# Patient Record
Sex: Female | Born: 1975 | Race: Black or African American | Hispanic: No | Marital: Single | Smoking: Current every day smoker
Health system: Southern US, Community
[De-identification: ages and names within clinical notes are randomized; demographics above are authoritative.]

## PROBLEM LIST (undated history)

## (undated) DIAGNOSIS — D219 Benign neoplasm of connective and other soft tissue, unspecified: Secondary | ICD-10-CM

## (undated) DIAGNOSIS — F32A Depression, unspecified: Secondary | ICD-10-CM

## (undated) DIAGNOSIS — F329 Major depressive disorder, single episode, unspecified: Secondary | ICD-10-CM

## (undated) DIAGNOSIS — G43909 Migraine, unspecified, not intractable, without status migrainosus: Secondary | ICD-10-CM

## (undated) DIAGNOSIS — K429 Umbilical hernia without obstruction or gangrene: Secondary | ICD-10-CM

## (undated) DIAGNOSIS — F419 Anxiety disorder, unspecified: Secondary | ICD-10-CM

## (undated) HISTORY — DX: Depression, unspecified: F32.A

## (undated) HISTORY — PX: TUBAL LIGATION: SHX77

## (undated) HISTORY — DX: Major depressive disorder, single episode, unspecified: F32.9

## (undated) HISTORY — PX: OTHER SURGICAL HISTORY: SHX169

---

## 1997-09-12 ENCOUNTER — Inpatient Hospital Stay (HOSPITAL_COMMUNITY): Admission: EM | Admit: 1997-09-12 | Discharge: 1997-09-12 | Payer: Self-pay | Admitting: Obstetrics

## 1997-11-02 ENCOUNTER — Inpatient Hospital Stay (HOSPITAL_COMMUNITY): Admission: AD | Admit: 1997-11-02 | Discharge: 1997-11-04 | Payer: Self-pay | Admitting: Obstetrics

## 1997-11-10 ENCOUNTER — Other Ambulatory Visit: Admission: RE | Admit: 1997-11-10 | Discharge: 1997-11-10 | Payer: Self-pay | Admitting: Obstetrics

## 1997-11-26 ENCOUNTER — Inpatient Hospital Stay (HOSPITAL_COMMUNITY): Admission: AD | Admit: 1997-11-26 | Discharge: 1997-11-26 | Payer: Self-pay | Admitting: Obstetrics

## 1997-12-14 ENCOUNTER — Ambulatory Visit (HOSPITAL_COMMUNITY): Admission: RE | Admit: 1997-12-14 | Discharge: 1997-12-14 | Payer: Self-pay | Admitting: Obstetrics

## 1998-03-15 ENCOUNTER — Inpatient Hospital Stay (HOSPITAL_COMMUNITY): Admission: AD | Admit: 1998-03-15 | Discharge: 1998-03-15 | Payer: Self-pay | Admitting: Obstetrics

## 1998-04-02 ENCOUNTER — Inpatient Hospital Stay (HOSPITAL_COMMUNITY): Admission: AD | Admit: 1998-04-02 | Discharge: 1998-04-02 | Payer: Self-pay | Admitting: Obstetrics

## 1998-04-04 ENCOUNTER — Inpatient Hospital Stay (HOSPITAL_COMMUNITY): Admission: AD | Admit: 1998-04-04 | Discharge: 1998-04-06 | Payer: Self-pay | Admitting: Obstetrics

## 1998-10-14 ENCOUNTER — Inpatient Hospital Stay (HOSPITAL_COMMUNITY): Admission: AD | Admit: 1998-10-14 | Discharge: 1998-10-14 | Payer: Self-pay | Admitting: *Deleted

## 1998-10-17 ENCOUNTER — Inpatient Hospital Stay (HOSPITAL_COMMUNITY): Admission: AD | Admit: 1998-10-17 | Discharge: 1998-10-17 | Payer: Self-pay | Admitting: Obstetrics

## 1998-12-05 ENCOUNTER — Emergency Department (HOSPITAL_COMMUNITY): Admission: EM | Admit: 1998-12-05 | Discharge: 1998-12-05 | Payer: Self-pay | Admitting: Emergency Medicine

## 1998-12-24 ENCOUNTER — Emergency Department (HOSPITAL_COMMUNITY): Admission: EM | Admit: 1998-12-24 | Discharge: 1998-12-24 | Payer: Self-pay | Admitting: Emergency Medicine

## 1999-03-18 ENCOUNTER — Emergency Department (HOSPITAL_COMMUNITY): Admission: EM | Admit: 1999-03-18 | Discharge: 1999-03-18 | Payer: Self-pay | Admitting: Emergency Medicine

## 1999-03-18 ENCOUNTER — Encounter: Payer: Self-pay | Admitting: Emergency Medicine

## 1999-08-11 ENCOUNTER — Emergency Department (HOSPITAL_COMMUNITY): Admission: EM | Admit: 1999-08-11 | Discharge: 1999-08-11 | Payer: Self-pay | Admitting: Emergency Medicine

## 1999-08-22 ENCOUNTER — Emergency Department (HOSPITAL_COMMUNITY): Admission: EM | Admit: 1999-08-22 | Discharge: 1999-08-22 | Payer: Self-pay | Admitting: Emergency Medicine

## 1999-09-20 ENCOUNTER — Emergency Department (HOSPITAL_COMMUNITY): Admission: EM | Admit: 1999-09-20 | Discharge: 1999-09-20 | Payer: Self-pay | Admitting: Emergency Medicine

## 1999-10-16 ENCOUNTER — Emergency Department (HOSPITAL_COMMUNITY): Admission: EM | Admit: 1999-10-16 | Discharge: 1999-10-16 | Payer: Self-pay | Admitting: Internal Medicine

## 2000-01-17 ENCOUNTER — Other Ambulatory Visit: Admission: RE | Admit: 2000-01-17 | Discharge: 2000-01-17 | Payer: Self-pay | Admitting: Obstetrics

## 2000-05-02 ENCOUNTER — Emergency Department (HOSPITAL_COMMUNITY): Admission: EM | Admit: 2000-05-02 | Discharge: 2000-05-02 | Payer: Self-pay

## 2000-05-27 ENCOUNTER — Emergency Department (HOSPITAL_COMMUNITY): Admission: EM | Admit: 2000-05-27 | Discharge: 2000-05-27 | Payer: Self-pay | Admitting: Emergency Medicine

## 2000-09-17 ENCOUNTER — Emergency Department (HOSPITAL_COMMUNITY): Admission: EM | Admit: 2000-09-17 | Discharge: 2000-09-17 | Payer: Self-pay

## 2001-12-23 ENCOUNTER — Emergency Department (HOSPITAL_COMMUNITY): Admission: EM | Admit: 2001-12-23 | Discharge: 2001-12-23 | Payer: Self-pay | Admitting: Emergency Medicine

## 2002-03-14 ENCOUNTER — Emergency Department (HOSPITAL_COMMUNITY): Admission: EM | Admit: 2002-03-14 | Discharge: 2002-03-15 | Payer: Self-pay | Admitting: Emergency Medicine

## 2002-05-15 ENCOUNTER — Inpatient Hospital Stay (HOSPITAL_COMMUNITY): Admission: AD | Admit: 2002-05-15 | Discharge: 2002-05-15 | Payer: Self-pay | Admitting: Obstetrics and Gynecology

## 2002-06-05 ENCOUNTER — Inpatient Hospital Stay (HOSPITAL_COMMUNITY): Admission: AD | Admit: 2002-06-05 | Discharge: 2002-06-05 | Payer: Self-pay | Admitting: *Deleted

## 2002-06-07 ENCOUNTER — Inpatient Hospital Stay (HOSPITAL_COMMUNITY): Admission: AD | Admit: 2002-06-07 | Discharge: 2002-06-07 | Payer: Self-pay | Admitting: *Deleted

## 2002-06-08 ENCOUNTER — Inpatient Hospital Stay (HOSPITAL_COMMUNITY): Admission: AD | Admit: 2002-06-08 | Discharge: 2002-06-08 | Payer: Self-pay | Admitting: *Deleted

## 2002-06-10 ENCOUNTER — Inpatient Hospital Stay (HOSPITAL_COMMUNITY): Admission: AD | Admit: 2002-06-10 | Discharge: 2002-06-10 | Payer: Self-pay | Admitting: *Deleted

## 2002-06-10 ENCOUNTER — Encounter: Payer: Self-pay | Admitting: *Deleted

## 2002-06-12 ENCOUNTER — Inpatient Hospital Stay (HOSPITAL_COMMUNITY): Admission: AD | Admit: 2002-06-12 | Discharge: 2002-06-12 | Payer: Self-pay | Admitting: *Deleted

## 2002-06-14 ENCOUNTER — Inpatient Hospital Stay (HOSPITAL_COMMUNITY): Admission: AD | Admit: 2002-06-14 | Discharge: 2002-06-14 | Payer: Self-pay | Admitting: *Deleted

## 2002-06-21 ENCOUNTER — Inpatient Hospital Stay (HOSPITAL_COMMUNITY): Admission: AD | Admit: 2002-06-21 | Discharge: 2002-06-21 | Payer: Self-pay | Admitting: *Deleted

## 2002-08-05 ENCOUNTER — Inpatient Hospital Stay (HOSPITAL_COMMUNITY): Admission: AD | Admit: 2002-08-05 | Discharge: 2002-08-05 | Payer: Self-pay | Admitting: Obstetrics

## 2002-09-07 ENCOUNTER — Inpatient Hospital Stay (HOSPITAL_COMMUNITY): Admission: AD | Admit: 2002-09-07 | Discharge: 2002-09-07 | Payer: Self-pay | Admitting: Obstetrics

## 2002-09-10 ENCOUNTER — Inpatient Hospital Stay (HOSPITAL_COMMUNITY): Admission: AD | Admit: 2002-09-10 | Discharge: 2002-09-10 | Payer: Self-pay | Admitting: Obstetrics

## 2002-09-12 ENCOUNTER — Inpatient Hospital Stay (HOSPITAL_COMMUNITY): Admission: AD | Admit: 2002-09-12 | Discharge: 2002-09-12 | Payer: Self-pay | Admitting: Obstetrics

## 2002-09-13 ENCOUNTER — Encounter: Payer: Self-pay | Admitting: Obstetrics

## 2002-09-13 ENCOUNTER — Ambulatory Visit (HOSPITAL_COMMUNITY): Admission: RE | Admit: 2002-09-13 | Discharge: 2002-09-13 | Payer: Self-pay | Admitting: Obstetrics

## 2002-11-08 ENCOUNTER — Inpatient Hospital Stay (HOSPITAL_COMMUNITY): Admission: AD | Admit: 2002-11-08 | Discharge: 2002-11-08 | Payer: Self-pay | Admitting: Obstetrics

## 2002-11-12 ENCOUNTER — Observation Stay (HOSPITAL_COMMUNITY): Admission: AD | Admit: 2002-11-12 | Discharge: 2002-11-13 | Payer: Self-pay | Admitting: Obstetrics

## 2002-12-16 ENCOUNTER — Inpatient Hospital Stay (HOSPITAL_COMMUNITY): Admission: AD | Admit: 2002-12-16 | Discharge: 2002-12-16 | Payer: Self-pay | Admitting: Obstetrics

## 2003-01-09 ENCOUNTER — Inpatient Hospital Stay (HOSPITAL_COMMUNITY): Admission: AD | Admit: 2003-01-09 | Discharge: 2003-01-09 | Payer: Self-pay | Admitting: Obstetrics

## 2003-01-11 ENCOUNTER — Inpatient Hospital Stay (HOSPITAL_COMMUNITY): Admission: AD | Admit: 2003-01-11 | Discharge: 2003-01-12 | Payer: Self-pay | Admitting: Obstetrics

## 2003-01-17 ENCOUNTER — Inpatient Hospital Stay (HOSPITAL_COMMUNITY): Admission: AD | Admit: 2003-01-17 | Discharge: 2003-01-17 | Payer: Self-pay | Admitting: Obstetrics

## 2003-02-15 ENCOUNTER — Inpatient Hospital Stay (HOSPITAL_COMMUNITY): Admission: AD | Admit: 2003-02-15 | Discharge: 2003-02-15 | Payer: Self-pay | Admitting: Obstetrics

## 2003-02-26 ENCOUNTER — Inpatient Hospital Stay (HOSPITAL_COMMUNITY): Admission: AD | Admit: 2003-02-26 | Discharge: 2003-02-26 | Payer: Self-pay | Admitting: Obstetrics

## 2003-03-09 ENCOUNTER — Inpatient Hospital Stay (HOSPITAL_COMMUNITY): Admission: AD | Admit: 2003-03-09 | Discharge: 2003-03-09 | Payer: Self-pay | Admitting: Obstetrics

## 2003-03-11 ENCOUNTER — Inpatient Hospital Stay (HOSPITAL_COMMUNITY): Admission: AD | Admit: 2003-03-11 | Discharge: 2003-03-12 | Payer: Self-pay | Admitting: Obstetrics

## 2003-03-24 ENCOUNTER — Inpatient Hospital Stay (HOSPITAL_COMMUNITY): Admission: AD | Admit: 2003-03-24 | Discharge: 2003-03-24 | Payer: Self-pay | Admitting: Obstetrics

## 2003-03-25 ENCOUNTER — Inpatient Hospital Stay (HOSPITAL_COMMUNITY): Admission: AD | Admit: 2003-03-25 | Discharge: 2003-03-26 | Payer: Self-pay | Admitting: Obstetrics

## 2003-03-28 ENCOUNTER — Inpatient Hospital Stay (HOSPITAL_COMMUNITY): Admission: AD | Admit: 2003-03-28 | Discharge: 2003-03-28 | Payer: Self-pay | Admitting: Obstetrics

## 2003-03-30 ENCOUNTER — Inpatient Hospital Stay (HOSPITAL_COMMUNITY): Admission: AD | Admit: 2003-03-30 | Discharge: 2003-03-30 | Payer: Self-pay | Admitting: Obstetrics

## 2003-04-01 ENCOUNTER — Inpatient Hospital Stay (HOSPITAL_COMMUNITY): Admission: AD | Admit: 2003-04-01 | Discharge: 2003-04-01 | Payer: Self-pay | Admitting: Obstetrics

## 2003-04-06 ENCOUNTER — Inpatient Hospital Stay (HOSPITAL_COMMUNITY): Admission: AD | Admit: 2003-04-06 | Discharge: 2003-04-06 | Payer: Self-pay | Admitting: Obstetrics

## 2003-04-08 ENCOUNTER — Inpatient Hospital Stay (HOSPITAL_COMMUNITY): Admission: AD | Admit: 2003-04-08 | Discharge: 2003-04-08 | Payer: Self-pay | Admitting: Obstetrics

## 2003-04-09 ENCOUNTER — Inpatient Hospital Stay (HOSPITAL_COMMUNITY): Admission: AD | Admit: 2003-04-09 | Discharge: 2003-04-12 | Payer: Self-pay | Admitting: Obstetrics

## 2003-04-09 ENCOUNTER — Encounter (INDEPENDENT_AMBULATORY_CARE_PROVIDER_SITE_OTHER): Payer: Self-pay | Admitting: *Deleted

## 2003-09-03 ENCOUNTER — Emergency Department (HOSPITAL_COMMUNITY): Admission: EM | Admit: 2003-09-03 | Discharge: 2003-09-03 | Payer: Self-pay

## 2003-10-19 ENCOUNTER — Emergency Department (HOSPITAL_COMMUNITY): Admission: EM | Admit: 2003-10-19 | Discharge: 2003-10-19 | Payer: Self-pay | Admitting: Emergency Medicine

## 2003-12-15 ENCOUNTER — Emergency Department (HOSPITAL_COMMUNITY): Admission: EM | Admit: 2003-12-15 | Discharge: 2003-12-16 | Payer: Self-pay | Admitting: Emergency Medicine

## 2004-03-12 ENCOUNTER — Emergency Department (HOSPITAL_COMMUNITY): Admission: EM | Admit: 2004-03-12 | Discharge: 2004-03-12 | Payer: Self-pay | Admitting: Family Medicine

## 2005-06-26 ENCOUNTER — Emergency Department (HOSPITAL_COMMUNITY): Admission: EM | Admit: 2005-06-26 | Discharge: 2005-06-26 | Payer: Self-pay | Admitting: Emergency Medicine

## 2005-07-29 ENCOUNTER — Emergency Department (HOSPITAL_COMMUNITY): Admission: EM | Admit: 2005-07-29 | Discharge: 2005-07-29 | Payer: Self-pay | Admitting: Emergency Medicine

## 2005-12-10 ENCOUNTER — Emergency Department (HOSPITAL_COMMUNITY): Admission: EM | Admit: 2005-12-10 | Discharge: 2005-12-10 | Payer: Self-pay | Admitting: Emergency Medicine

## 2006-01-28 ENCOUNTER — Emergency Department (HOSPITAL_COMMUNITY): Admission: EM | Admit: 2006-01-28 | Discharge: 2006-01-28 | Payer: Self-pay | Admitting: Emergency Medicine

## 2006-02-16 ENCOUNTER — Emergency Department (HOSPITAL_COMMUNITY): Admission: EM | Admit: 2006-02-16 | Discharge: 2006-02-16 | Payer: Self-pay | Admitting: Emergency Medicine

## 2006-11-22 IMAGING — CT CT ABDOMEN W/ CM
1 of 3 series · 14 of 32 positions shown, 19 images · IV contrast (OMNI 350 25 ML & [ID] OMNI 300)
Comparison: None.

CLINICAL DATA: Abdominal and pelvic pain. Periumbilical pain.  
 ABDOMEN CT WITH CONTRAST:
TECHNIQUE: Multidetector CT imaging of the abdomen was performed following the standard protocol during bolus administration of intravenous contrast.
 Contrast:  Oral contrast and 100 cc intravenous Omnipaque 300.
TECHNIQUE: Multidetector CT imaging of the pelvis was performed following the standard protocol during bolus administration of intravenous contrast.  
 The bowel and bladder are unremarkable.  2 cm follicle/cyst in the right ovary is noted.  Appendix is normal.  No free fluid or enlarged lymph nodes.

[Series 2: abd pelvis · axial · 0.74mm/px · z∈[-412,-57]mm · 14 of 83 slices shown, 19 images]
[im 6/83  soft-tissue]
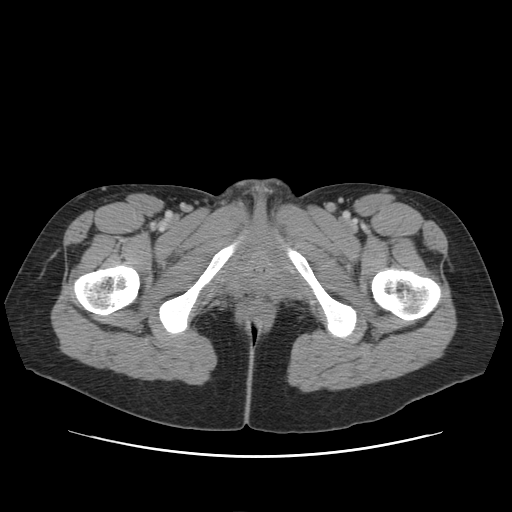
[im 6/83  bone]
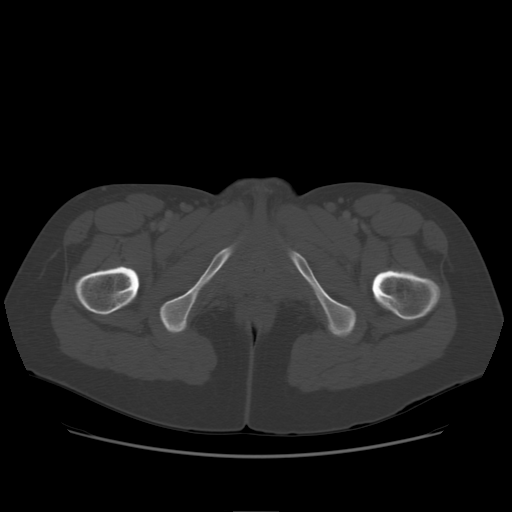
[im 11/83  soft-tissue]
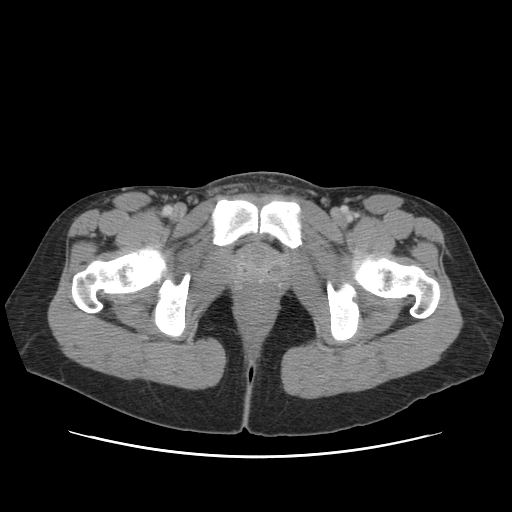
[im 16/83  soft-tissue]
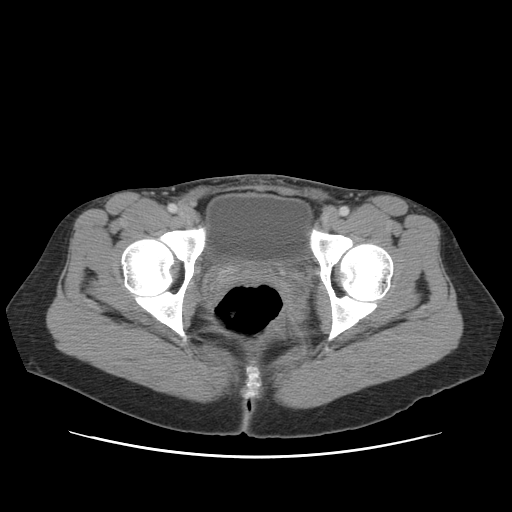
[im 26/83  soft-tissue]
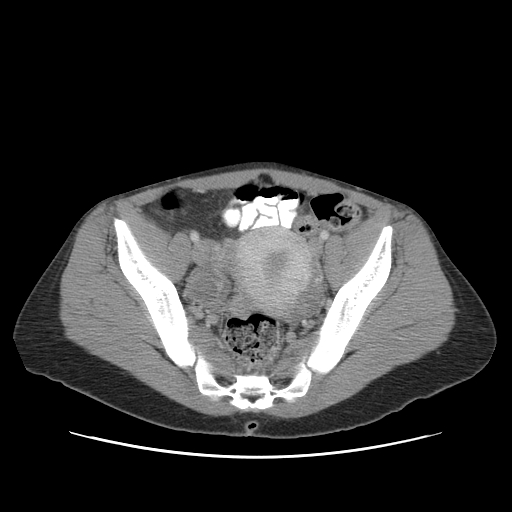
[im 31/83  soft-tissue]
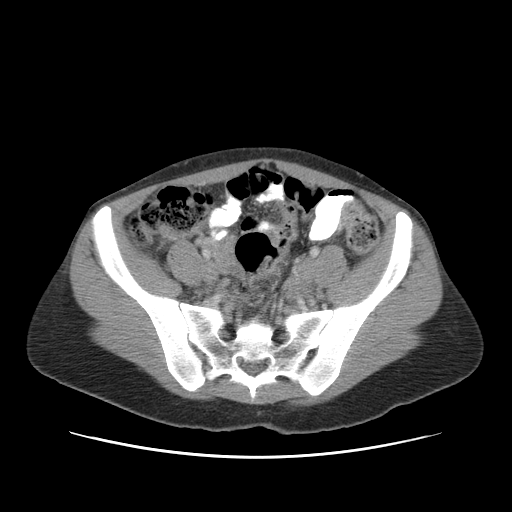
[im 36/83  soft-tissue]
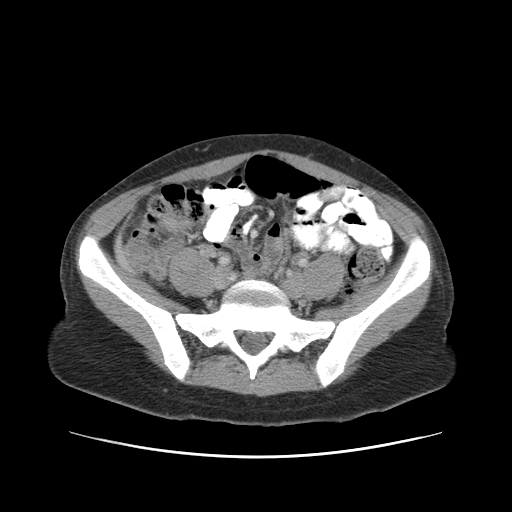
[im 42/83  soft-tissue]
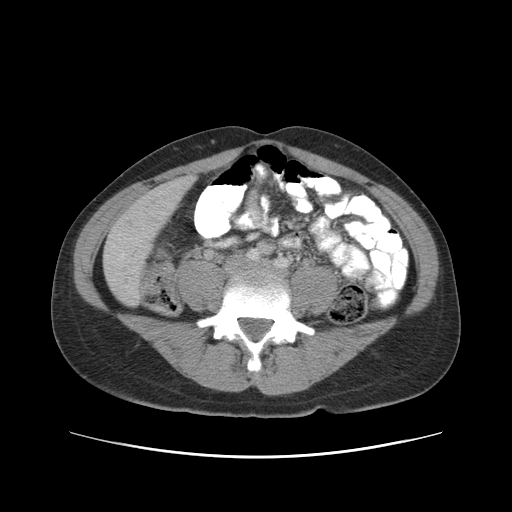
[im 47/83  soft-tissue]
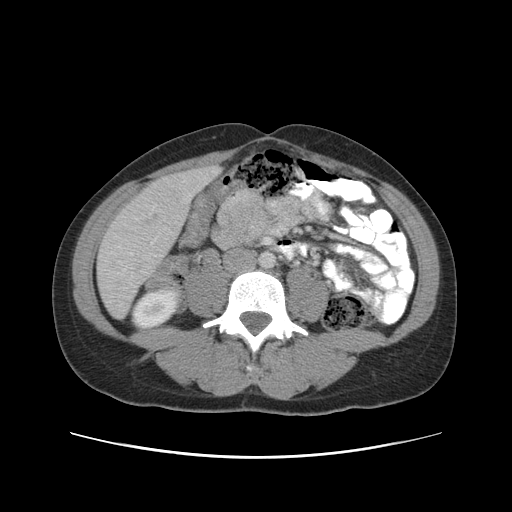
[im 52/83  soft-tissue]
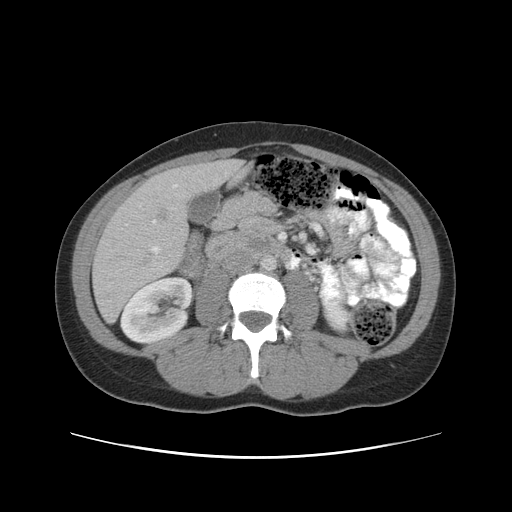
[im 52/83  bone]
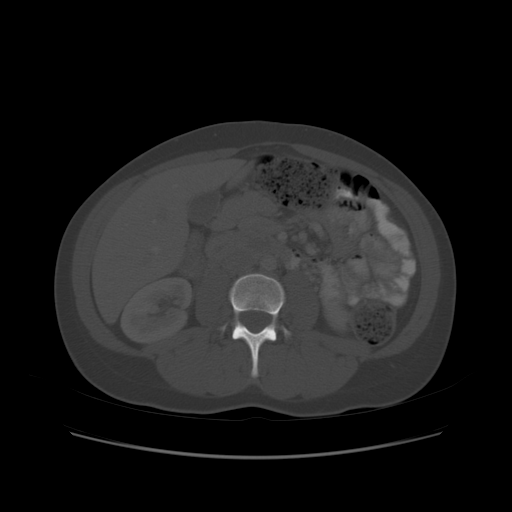
[im 57/83  soft-tissue]
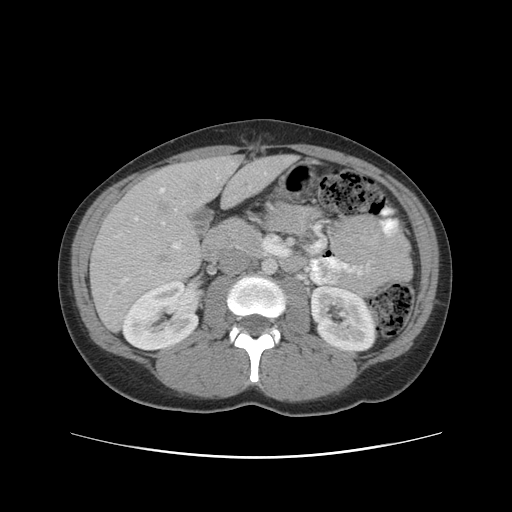
[im 62/83  lung]
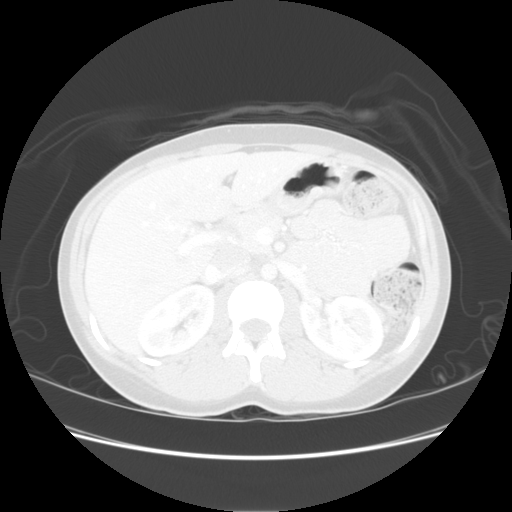
[im 67/83  soft-tissue]
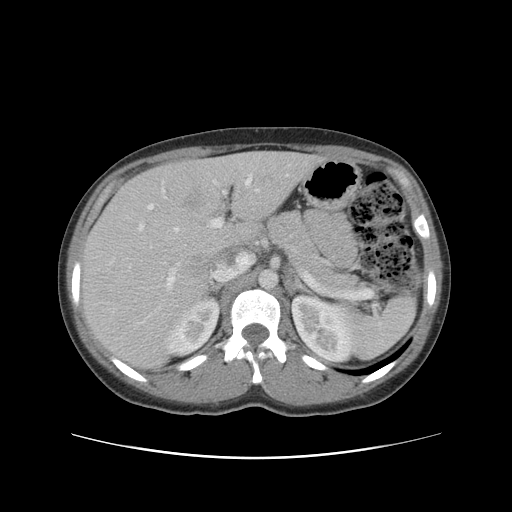
[im 67/83  lung]
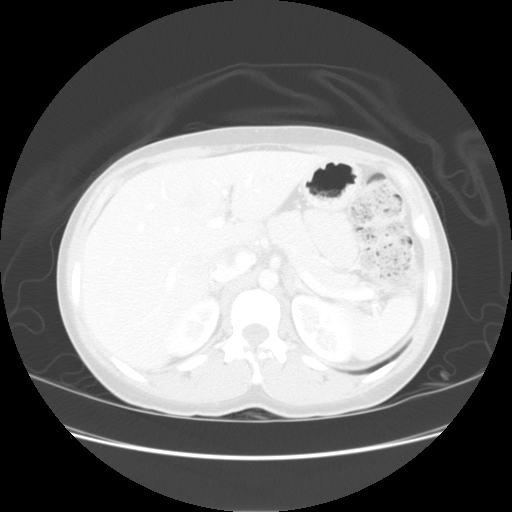
[im 72/83  soft-tissue]
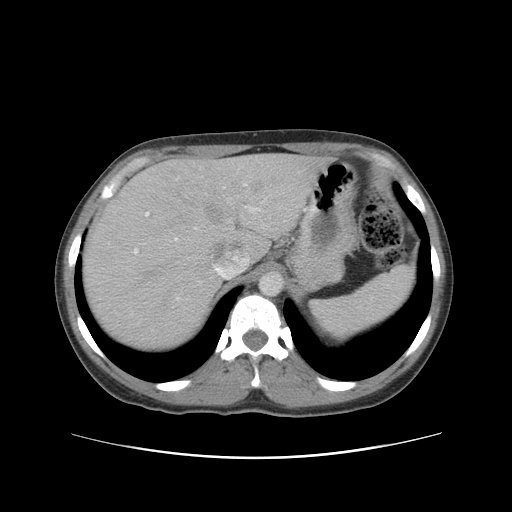
[im 72/83  lung]
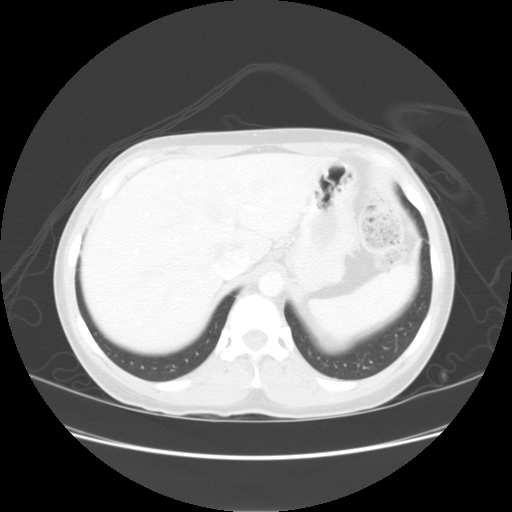
[im 77/83  soft-tissue]
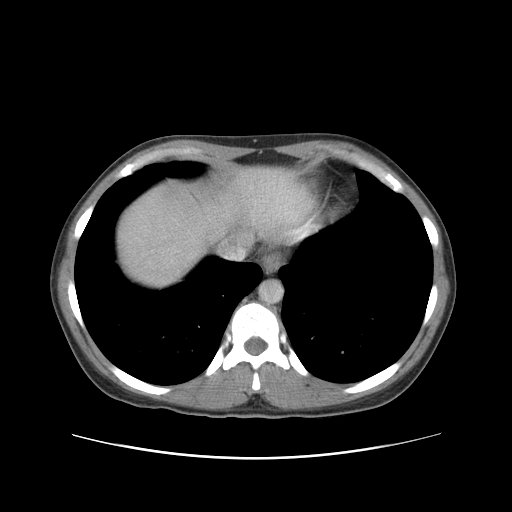
[im 77/83  lung]
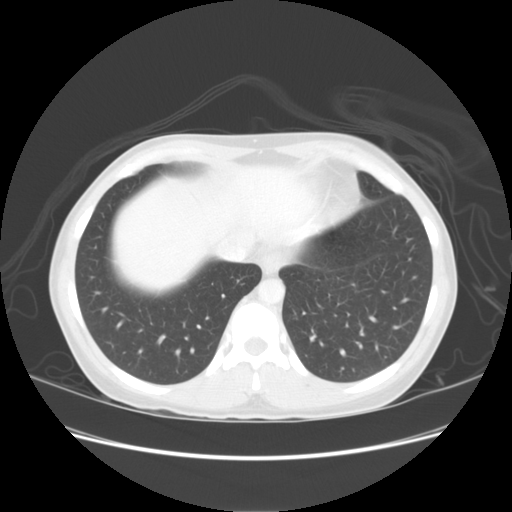

[14 of 32 positions shown; findings below may reference images not displayed]

FINDINGS: The liver, spleen, adrenal glands, kidneys, pancreas, and gallbladder are unremarkable.  No evidence of free fluid, enlarged lymph nodes, abdominal aortic aneurysm, or biliary dilatation.  The visualized bowel is unremarkable.  A small midline ventral hernia in the mid/upper abdomen containing only fat is noted.  No free fluid, enlarged lymph nodes, abdominal aortic aneurysm, or biliary dilatation.
IMPRESSION: Midline mid/upper abdominal ventral hernia containing only fat.  
 PELVIS CT WITH CONTRAST:
IMPRESSION: No acute abnormality.

## 2006-11-22 IMAGING — US US ABDOMEN COMPLETE
1 series · 14 of 25 positions shown · non-contrast
Comparison: None

CLINICAL DATA: Abdominal pain. History of abdominal wall hernia and cesarean
section.

COMPLETE ABDOMINAL ULTRASOUND  06/26/2005:

[Series 1: unknown · 0.27mm/px · 14 of 54 slices shown]
[im 1/54]
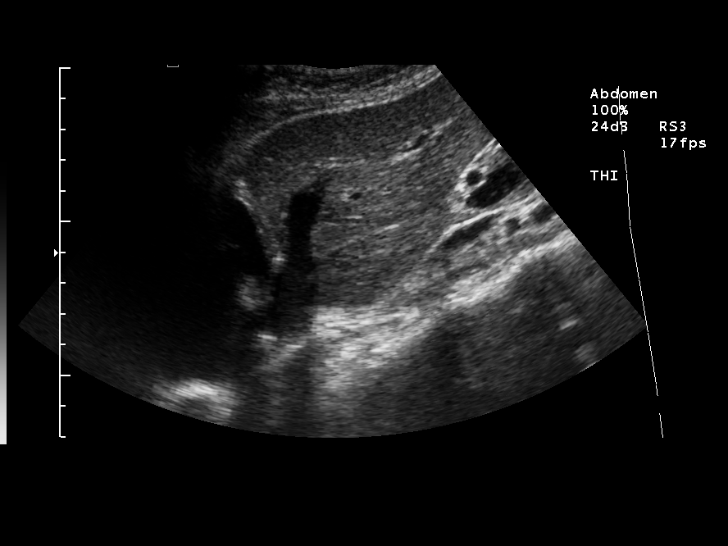
[im 5/54]
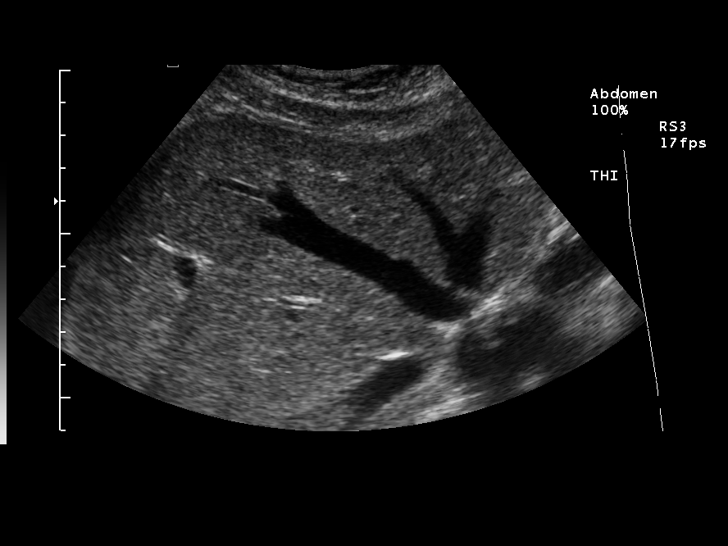
[im 9/54]
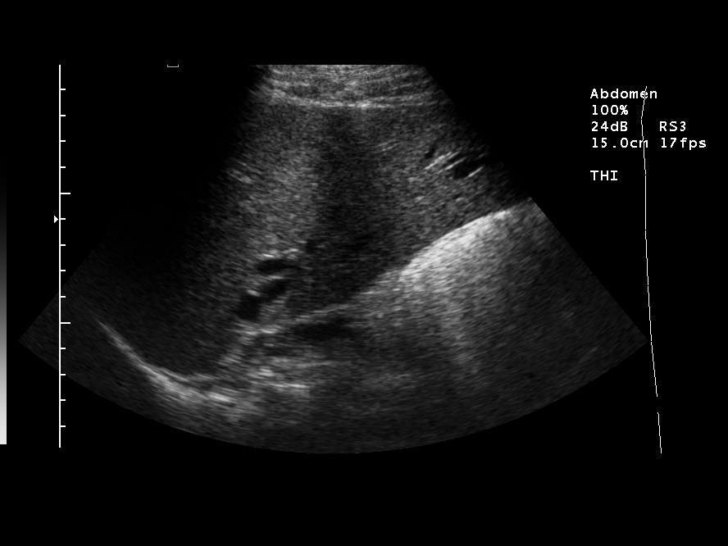
[im 14/54]
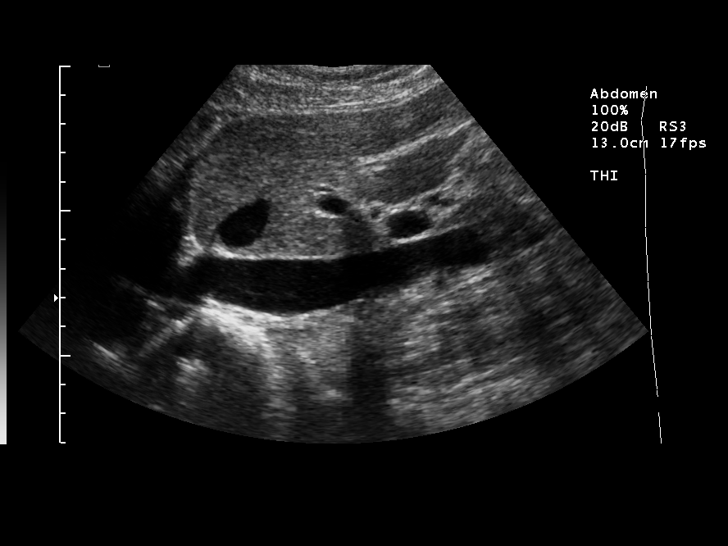
[im 18/54]
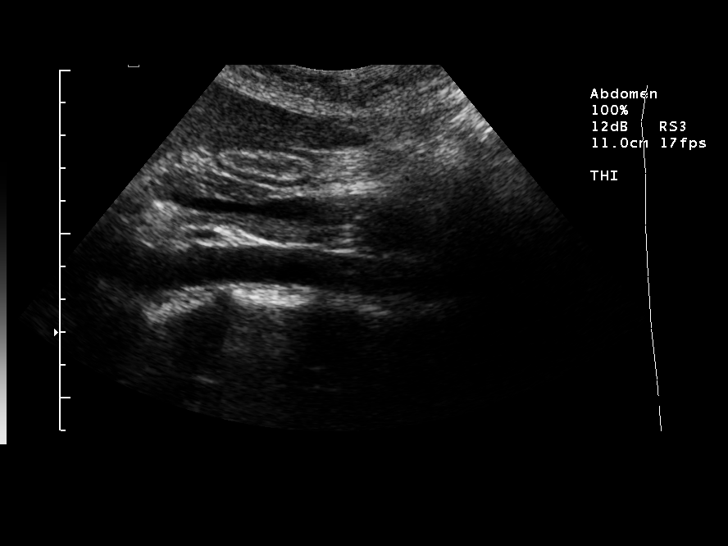
[im 20/54]
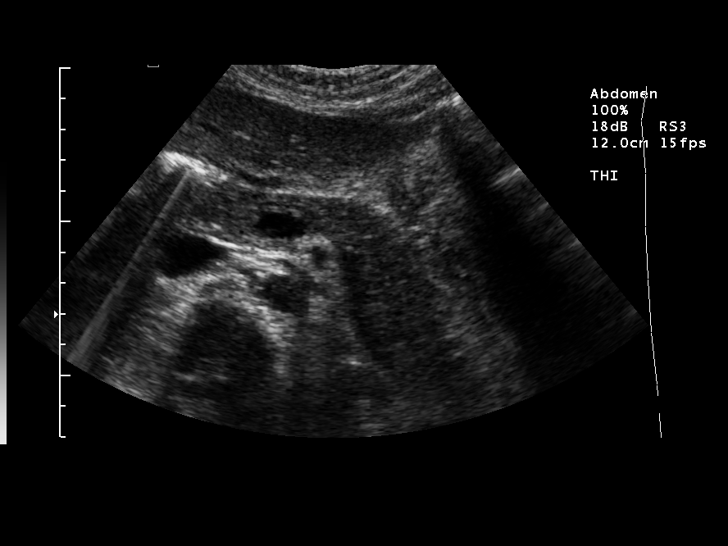
[im 25/54]
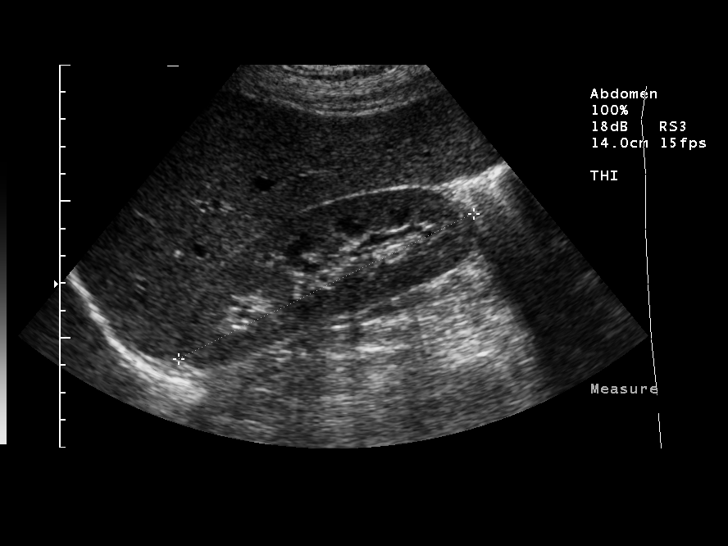
[im 29/54]
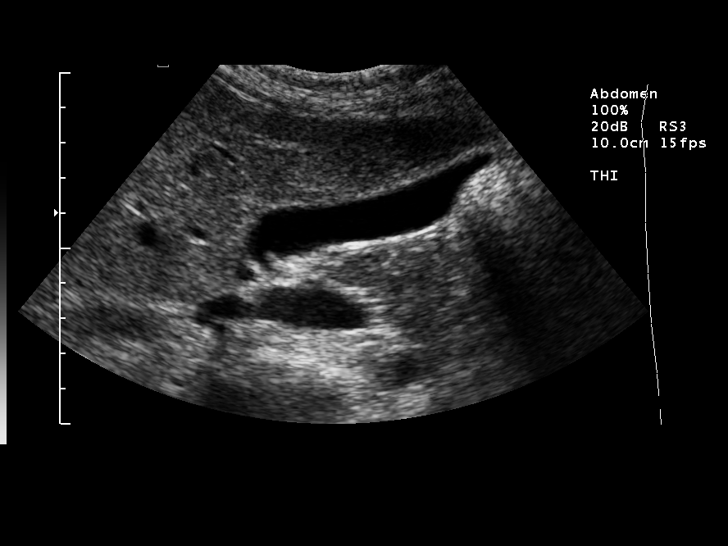
[im 34/54]
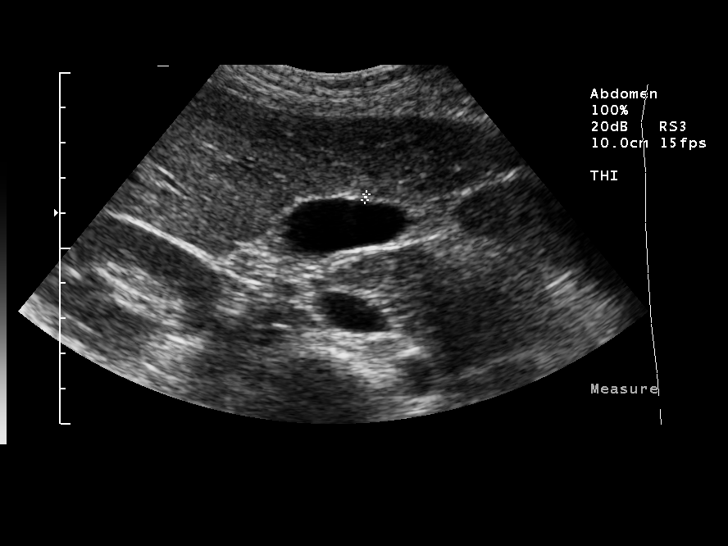
[im 36/54]
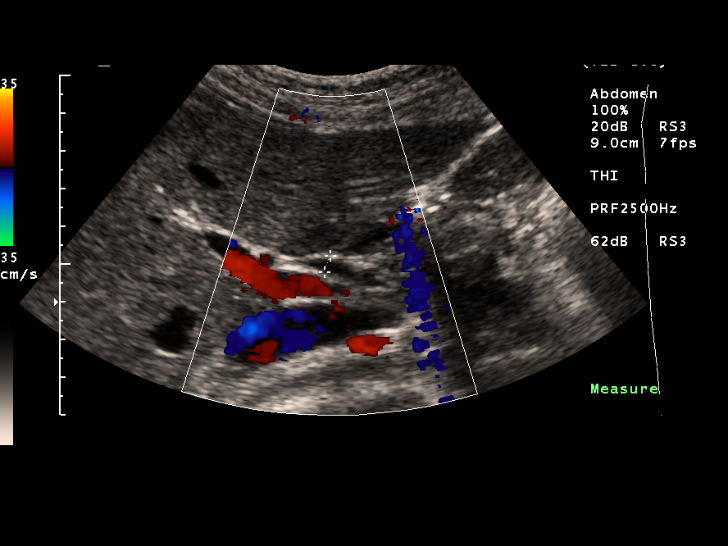
[im 40/54]
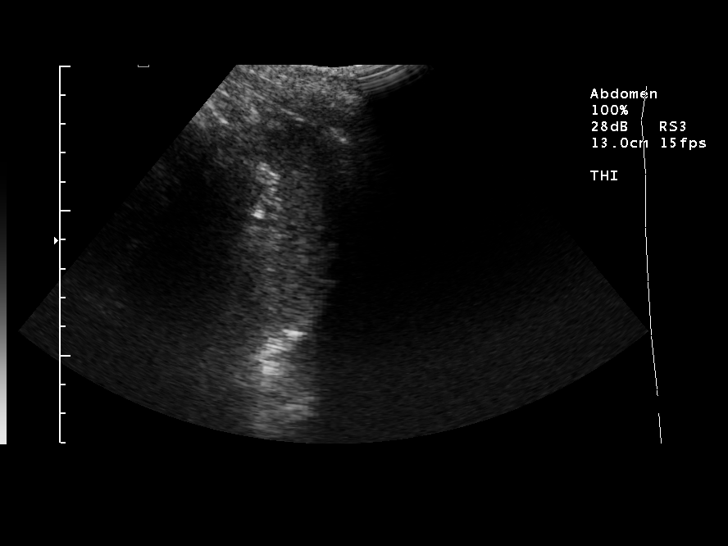
[im 45/54]
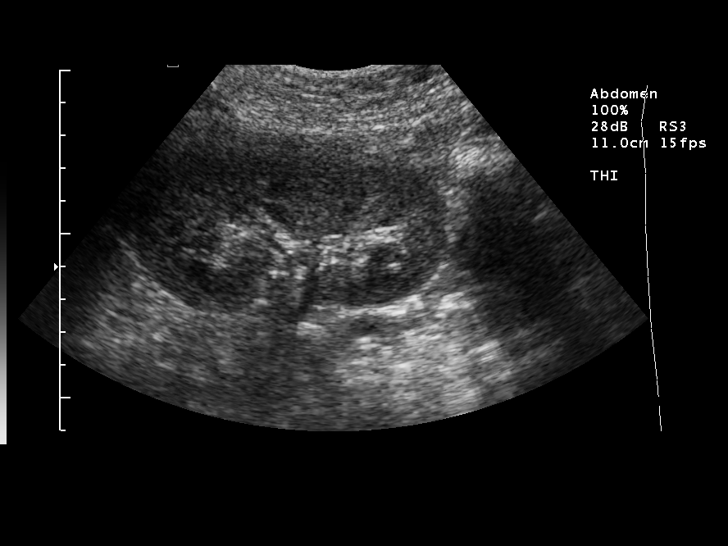
[im 49/54]
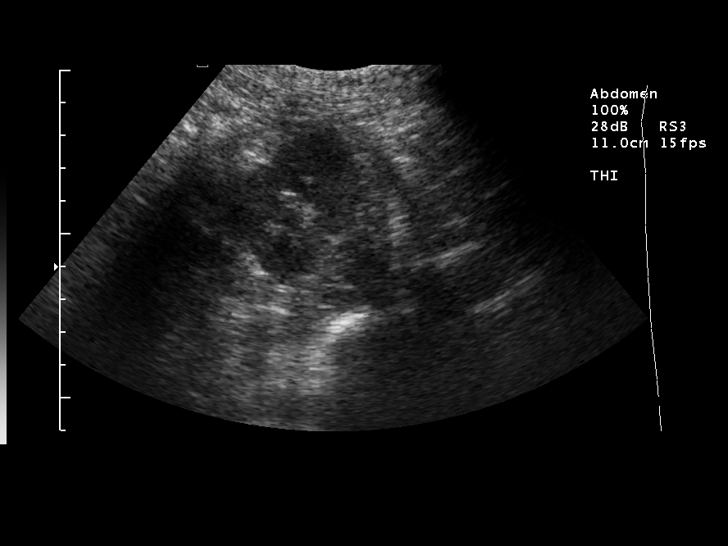
[im 54/54]
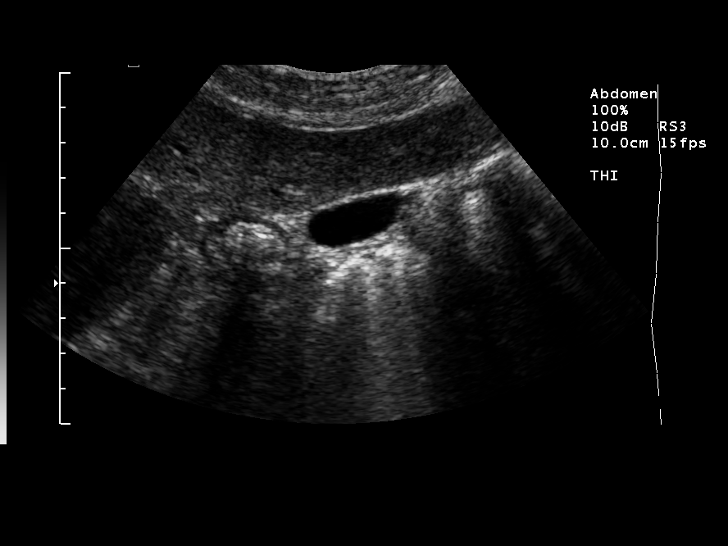

[14 of 25 positions shown; findings below may reference images not displayed]

FINDINGS: Gallbladder:  Normal.

Common bile duct: Normal, 5 mm diameter.

Liver:  Normal. Patent portal vein with hepatopetal flow.

Inferior vena cava:  Patent.

Pancreas:  Normal.

Spleen:  Normal, 7.3 cm length.

Right kidney:  Normal, 12.0 cm length.

Left kidney:  Normal, 10.0 cm length.

Abdominal aorta:  Normal, maximum diameter 1.7 cm.
IMPRESSION: Normal abdominal ultrasound.

## 2009-10-16 ENCOUNTER — Emergency Department (HOSPITAL_COMMUNITY): Admission: EM | Admit: 2009-10-16 | Discharge: 2009-10-17 | Payer: Self-pay | Admitting: Emergency Medicine

## 2010-02-27 ENCOUNTER — Ambulatory Visit (HOSPITAL_COMMUNITY): Admission: RE | Admit: 2010-02-27 | Discharge: 2010-02-27 | Payer: Self-pay | Admitting: Obstetrics

## 2010-03-14 ENCOUNTER — Emergency Department (HOSPITAL_COMMUNITY): Admission: EM | Admit: 2010-03-14 | Discharge: 2010-03-14 | Payer: Self-pay | Admitting: Emergency Medicine

## 2010-09-12 ENCOUNTER — Emergency Department (HOSPITAL_COMMUNITY)
Admission: EM | Admit: 2010-09-12 | Discharge: 2010-09-12 | Disposition: A | Discharge status: Home / Self Care | Payer: Medicaid Other | Attending: Emergency Medicine | Admitting: Emergency Medicine

## 2010-09-12 DIAGNOSIS — J020 Streptococcal pharyngitis: Secondary | ICD-10-CM | POA: Insufficient documentation

## 2010-09-12 DIAGNOSIS — G43909 Migraine, unspecified, not intractable, without status migrainosus: Secondary | ICD-10-CM | POA: Insufficient documentation

## 2010-10-04 ENCOUNTER — Inpatient Hospital Stay (HOSPITAL_COMMUNITY)
Admission: AD | Admit: 2010-10-04 | Discharge: 2010-10-05 | Disposition: A | Discharge status: Home / Self Care | Payer: Medicaid Other | Source: Ambulatory Visit | Attending: Obstetrics | Admitting: Obstetrics

## 2010-10-04 DIAGNOSIS — R112 Nausea with vomiting, unspecified: Secondary | ICD-10-CM | POA: Insufficient documentation

## 2010-10-04 DIAGNOSIS — N949 Unspecified condition associated with female genital organs and menstrual cycle: Secondary | ICD-10-CM | POA: Insufficient documentation

## 2010-10-04 LAB — URINALYSIS, ROUTINE W REFLEX MICROSCOPIC
Hgb urine dipstick: NEGATIVE
Nitrite: NEGATIVE
Protein, ur: NEGATIVE mg/dL
Specific Gravity, Urine: 1.02 (ref 1.005–1.030)
Urobilinogen, UA: 1 mg/dL (ref 0.0–1.0)
pH: 7 (ref 5.0–8.0)

## 2010-10-04 LAB — WET PREP, GENITAL: Trich, Wet Prep: NONE SEEN

## 2010-10-04 LAB — POCT PREGNANCY, URINE: Preg Test, Ur: NEGATIVE

## 2010-10-05 LAB — COMPREHENSIVE METABOLIC PANEL
ALT: 13 U/L (ref 0–35)
AST: 18 U/L (ref 0–37)
Albumin: 3.7 g/dL (ref 3.5–5.2)
Alkaline Phosphatase: 59 U/L (ref 39–117)
Calcium: 9.4 mg/dL (ref 8.4–10.5)
GFR calc Af Amer: >60 mL/min (ref >60)
Glucose, Bld: 98 mg/dL (ref 70–99)
Sodium: 137 mEq/L (ref 135–145)
Total Protein: 6.3 g/dL (ref 6.0–8.3)

## 2010-10-05 LAB — DIFFERENTIAL
Lymphocytes Relative: 26 % (ref 12–46)
Lymphs Abs: 2.2 10*3/uL (ref 0.7–4.0)
Monocytes Absolute: 0.4 10*3/uL (ref 0.1–1.0)
Monocytes Relative: 5 % (ref 3–12)
Neutro Abs: 5.7 10*3/uL (ref 1.7–7.7)

## 2010-10-05 LAB — CBC
HCT: 36.7 % (ref 36.0–46.0)
MCH: 30.3 pg (ref 26.0–34.0)
MCV: 84.8 fL (ref 78.0–100.0)
Platelets: 281 10*3/uL (ref 150–400)
RDW: 13.4 % (ref 11.5–15.5)

## 2010-10-05 LAB — GC/CHLAMYDIA PROBE AMP, GENITAL: Chlamydia, DNA Probe: NEGATIVE

## 2010-11-09 NOTE — H&P (Signed)
   NAME:  Renee Herrera, Renee Herrera                          ACCOUNT NO.:  [id number]   MEDICAL RECORD NO.:  [id number]                   PATIENT TYPE:  INP   LOCATION:  9124                                 FACILITY:  [facility]   PHYSICIAN:  Kathreen Cosier, M.D.           DATE OF BIRTH:  [birth year]   DATE OF ADMISSION:  04/09/2003  DATE OF DISCHARGE:                                HISTORY & PHYSICAL   HISTORY OF PRESENT ILLNESS:  The patient is a 35 year old, gravida 6, para 2-  1-2-3, EDC between March 31, 2003 and April 18, 2003, negative GBS,  limited prenatal care.  The patient missed a lot of her visits.  For the  past ten days, she has been in the hospital almost every day, has been  monitored and the tracing has been reactive.  Her ultrasound, done a week  ago, gave an estimated fetal weight of 8 pounds 8 ounces, and her cervix was  3-4 cm, 70%, vertex, -3.   On admission, ARM was performed.  The fluid was clear.  She was started on  low dose Pitocin.  She progressed to 8 cm by 4:30 p.m. with molding to -1  station.  She was contracting on her own and, after two or more hours, she  was still 8 cm.  Then it was decided she would deliver by C-section because  of failure to progress in labor.   PHYSICAL EXAMINATION:  GENERAL:  Revealed a well-developed female, in labor.  HEENT:  Negative.  LUNGS:  Clear.  HEART:  Regular rhythm.  No murmurs, no gallops.  BREASTS:  No masses.  ABDOMEN:  Term.  Estimated fetal weight by ultrasound, 8 pounds 8 ounces.  PELVIC:  As described above.  EXTREMITIES:  Negative.                                               Kathreen Cosier, M.D.    BAM/MEDQ  D:  04/09/2003  T:  04/09/2003  Job:  [id number]

## 2010-11-09 NOTE — Discharge Summary (Signed)
   NAME:  Renee Herrera, Renee Herrera                          ACCOUNT NO.:  [id number]   MEDICAL RECORD NO.:  [id number]                   PATIENT TYPE:  INP   LOCATION:  9124                                 [facility]ACILITY:  [facility]   PHYSICIAN:  Kathreen Cosier, M.D.           DATE OF BIRTH:  1975-07-04   DATE OF ADMISSION:  04/09/2003  DATE OF DISCHARGE:  04/12/2003                                 DISCHARGE SUMMARY   HISTORY OF PRESENT ILLNESS:  The patient is a 35 year old gravida 6, para 2-  1-2-3, [facility] March 31, 2003 to April 18, 2003.  Negative GBS.  Was admitted  for induction.  Limited prenatal care.  Ultrasound gave estimated fetal  weight of 8 pounds 8 ounces.  Cervix was 3-4 cm, 70%, vertex, -3.  She  received Pitocin stimulation and progressed to 8 cm, molded to -1 station,  and did not make any further progress with adequate labor.  She had a  primary low transverse cesarean section and tubal ligation of a female,  Apgar 8-10 from the OP position.  There was a nuchal cord.  The placenta was  posterior.  She had a female weighing 8 pounds 10 ounces.  Postoperatively  she did well.  Her hemoglobin was 7.7.  She was asymptomatic and she was  discharged without any complaints on April 12, 2003 on Tylox one q.3-4h.  p.r.n. and ferrous sulfate 325 mg p.o. daily.   DISCHARGE DIAGNOSES:  Status post primary low transverse cesarean section  and tubal ligation at term for failure to progress in labor.                                               Kathreen Cosier, M.D.    BAM/MEDQ  D:  04/12/2003  T:  04/12/2003  Job:  [id number]

## 2010-11-09 NOTE — Op Note (Signed)
   NAME:  Renee Herrera, Renee Herrera                          ACCOUNT NO.:  [id number]   MEDICAL RECORD NO.:  [id number]                   PATIENT TYPE:  INP   LOCATION:  9124                                 [facility]ACILITY:  [facility]   PHYSICIAN:  Kathreen Cosier, M.D.           DATE OF BIRTH:  January 26, 1976   DATE OF PROCEDURE:  04/09/2003  DATE OF DISCHARGE:                                 OPERATIVE REPORT   PREOPERATIVE DIAGNOSIS:  At term and failed induction, failure to progress.   SURGEON:  Kathreen Cosier, M.D.   ANESTHESIA:  Spinal.   PROCEDURE:  The patient is on the operating table in a supine position after  a spinal anesthesia.  Abdomen prepped and draped.  Bladder emptied with a  Foley catheter  Transverse suprapubic incision made, carried down through  the subfascia and fascia for the length of the incision.  The recti muscles  were retracted laterally, peritoneum incised longitudinally.  Transverse  incision made in the visceral peritoneum above the bladder, and the bladder  mobilized.  Transverse lower uterine incision made, and it was noted that  there was a nuchal cord.  The infant as delivered from the OP position, a  female with Apgars 8 and 10.  The team was in attendance, and the baby  weighed 8 pounds, 10 ounces.  The placenta was posterior and removed  manually.  Uterine cavity cleaned with dry laps.  Uterine incision closed in  one layer with continuous suture of #1 chromic  Hemostasis was satisfactory.  Bladder flap reattached with 2-0 chromic.  Tubes and ovaries normal.  The  right tube grasped in the mid-portion with a Babcock clamp, 0 plain suture  placed in the mesosalpinx below the portion of the tube in the clamp, and  this was transected.  Approximately 1 inch of tube was transected.  The  procedure was done in exact fashion on the other side.  Hemostasis  satisfactory, the blood loss was 600 cc.  Abdomen closed in layers,  peritoneum with continuous suture of 0  chromic, fascia with continuous  suture of 0 Dexon, and the skin closed with subcuticular stitch of 3-0  Monocryl.                                               Kathreen Cosier, M.D.    BAM/MEDQ  D:  04/09/2003  T:  04/10/2003  Job:  [id number]

## 2010-12-31 ENCOUNTER — Emergency Department (HOSPITAL_COMMUNITY)
Admission: EM | Admit: 2010-12-31 | Discharge: 2010-12-31 | Disposition: A | Discharge status: Home / Self Care | Payer: Medicaid Other | Attending: Emergency Medicine | Admitting: Emergency Medicine

## 2010-12-31 DIAGNOSIS — S0510XA Contusion of eyeball and orbital tissues, unspecified eye, initial encounter: Secondary | ICD-10-CM | POA: Insufficient documentation

## 2010-12-31 DIAGNOSIS — F411 Generalized anxiety disorder: Secondary | ICD-10-CM | POA: Insufficient documentation

## 2010-12-31 DIAGNOSIS — H5710 Ocular pain, unspecified eye: Secondary | ICD-10-CM | POA: Insufficient documentation

## 2010-12-31 DIAGNOSIS — F329 Major depressive disorder, single episode, unspecified: Secondary | ICD-10-CM | POA: Insufficient documentation

## 2010-12-31 DIAGNOSIS — Z79899 Other long term (current) drug therapy: Secondary | ICD-10-CM | POA: Insufficient documentation

## 2010-12-31 DIAGNOSIS — X58XXXA Exposure to other specified factors, initial encounter: Secondary | ICD-10-CM | POA: Insufficient documentation

## 2010-12-31 DIAGNOSIS — F3289 Other specified depressive episodes: Secondary | ICD-10-CM | POA: Insufficient documentation

## 2011-05-18 ENCOUNTER — Encounter: Payer: Self-pay | Admitting: *Deleted

## 2011-05-18 ENCOUNTER — Emergency Department (HOSPITAL_COMMUNITY)
Admission: EM | Admit: 2011-05-18 | Discharge: 2011-05-18 | Disposition: A | Discharge status: Home / Self Care | Payer: Medicaid Other | Attending: Emergency Medicine | Admitting: Emergency Medicine

## 2011-05-18 DIAGNOSIS — A5900 Urogenital trichomoniasis, unspecified: Secondary | ICD-10-CM | POA: Insufficient documentation

## 2011-05-18 DIAGNOSIS — N390 Urinary tract infection, site not specified: Secondary | ICD-10-CM

## 2011-05-18 DIAGNOSIS — R52 Pain, unspecified: Secondary | ICD-10-CM | POA: Insufficient documentation

## 2011-05-18 LAB — URINALYSIS, ROUTINE W REFLEX MICROSCOPIC
Bilirubin Urine: NEGATIVE
Glucose, UA: NEGATIVE mg/dL
Protein, ur: NEGATIVE mg/dL
Urobilinogen, UA: 0.2 mg/dL (ref 0.0–1.0)
pH: 5 (ref 5.0–8.0)

## 2011-05-18 LAB — POCT PREGNANCY, URINE: Preg Test, Ur: NEGATIVE

## 2011-05-18 LAB — URINE MICROSCOPIC-ADD ON

## 2011-05-18 MED ORDER — IBUPROFEN 800 MG PO TABS: 800.0000 mg | ORAL_TABLET | Freq: Three times a day (TID) | ORAL | Status: AC

## 2011-05-18 MED ORDER — NITROFURANTOIN MONOHYD MACRO 100 MG PO CAPS: 100.0000 mg | ORAL_CAPSULE | Freq: Two times a day (BID) | ORAL | Status: AC

## 2011-05-18 MED ORDER — KETOROLAC TROMETHAMINE 30 MG/ML IJ SOLN
30.0000 mg | Freq: Once | INTRAMUSCULAR | Status: AC
  Administered 2011-05-18: 30 mg via INTRAVENOUS
  Filled 2011-05-18: qty 1

## 2011-05-18 MED ORDER — ONDANSETRON 8 MG PO TBDP
8.0000 mg | ORAL_TABLET | Freq: Once | ORAL | Status: AC
  Administered 2011-05-18: 8 mg via ORAL
  Filled 2011-05-18: qty 1

## 2011-05-18 MED ORDER — METRONIDAZOLE 500 MG PO TABS: 500.0000 mg | ORAL_TABLET | Freq: Two times a day (BID) | ORAL | Status: AC

## 2011-05-18 NOTE — ED Notes (Signed)
Per EMS, called to pt's home due to N/V and fever that started yesterday.

## 2011-05-18 NOTE — ED Notes (Signed)
ZOX:[id number]<AV> Expected date:05/18/11<BR> Expected time:11:52 AM<BR> Means of arrival:Ambulance<BR> Comments:<BR> Nausea/vomiting

## 2011-05-18 NOTE — ED Provider Notes (Signed)
History     CSN: [id number] Arrival date & time: 05/18/2011 12:14 PM   First MD Initiated Contact with Patient 05/18/11 1245      Chief Complaint  Patient presents with  . Nausea    (Consider location/radiation/quality/duration/timing/severity/associated sxs/prior treatment) HPI  Patient presents to emergency department by EMS complaining of gradual onset all over body aches, lower dominant cramping, lower back pain that began at 8:30 last night and has persisted throughout the day today with one episode of vomiting today and mild ongoing nausea. Patient states her last menstrual period was on October 17, but states she has irregular periods. Patient denies known fevers but states she's felt chilled. Denies headache, sore throat, dizziness, chest pain, cough, shortness of breath, diarrhea, dysuria, hematuria, vaginal discharge, pelvic pain, or dyspareunia. Patient states she is G5 P4 M1 L4. Denies aggrevating or alleviating factors. States she has been able to drink some fluids today without difficulty.  History reviewed. No pertinent past medical history.  No past surgical history on file.  No family history on file.  History  Substance Use Topics  . Smoking status: Not on file  . Smokeless tobacco: Not on file  . Alcohol Use: Not on file    OB History    Grav Para Term Preterm Abortions TAB SAB Ect Mult Living                  Review of Systems  All other systems reviewed and are negative.    Allergies  Review of patient's allergies indicates no known allergies.  Home Medications  No current outpatient prescriptions on file.  BP 125/66  Pulse 86  Temp(Src) 99.5 F (37.5 C) (Oral)  Resp 20  SpO2 100%  Physical Exam  Nursing note and vitals reviewed. Constitutional: She is oriented to person, place, and time. She appears well-developed and well-nourished. No distress.  HENT:  Head: Normocephalic and atraumatic.  Eyes: Conjunctivae and EOM are normal. Pupils  are equal, round, and reactive to light.  Neck: Normal range of motion. Neck supple.  Cardiovascular: Normal rate, regular rhythm, normal heart sounds and intact distal pulses.  Exam reveals no gallop and no friction rub.   No murmur heard. Pulmonary/Chest: Effort normal and breath sounds normal. No respiratory distress. She has no wheezes. She has no rales. She exhibits no tenderness.  Abdominal: Bowel sounds are normal. She exhibits no distension and no mass. There is no tenderness. There is no rebound and no guarding.  Genitourinary: Vagina normal. Cervix exhibits no motion tenderness and no discharge. Right adnexum displays no tenderness. Left adnexum displays no tenderness. No tenderness around the vagina. No vaginal discharge found.  Musculoskeletal: Normal range of motion. She exhibits no edema and no tenderness.  Neurological: She is alert and oriented to person, place, and time.  Skin: Skin is warm and dry. No rash noted. She is not diaphoretic. No erythema.  Psychiatric: She has a normal mood and affect.    ED Course  Procedures (including critical care time)  Labs Reviewed  URINALYSIS, ROUTINE W REFLEX MICROSCOPIC - Abnormal; Notable for the following:    Appearance CLOUDY (*)    Hgb urine dipstick SMALL (*)    Ketones, ur 15 (*)    Leukocytes, UA SMALL (*)    All other components within normal limits  URINE MICROSCOPIC-ADD ON - Abnormal; Notable for the following:    Squamous Epithelial / LPF FEW (*)    Bacteria, UA MANY (*)    All  other components within normal limits  POCT PREGNANCY, URINE  POCT PREGNANCY, URINE   No results found.   No diagnosis found.    MDM  Patient is afebrile and nontoxic-appearing. Despite complaining of intermittent abdominal cramping abdomen is soft and nontender with no peritoneal signs and no CMT or adenexal TTP. Urinalysis shows urinary tract infection as well as Trichomonas in urine. Spoke at length with patient about urinalysis  findings and treatment. Patient again denies pelvic pain or vaginal discharge. Patient complaining of general body aches. Will treat for urinary tract infection and trichomonas that patient alternate between, ibuprofen for general aches and pains. Patient is tolerating fluids well.        [place], [place] 05/18/11 [phone]

## 2011-05-18 NOTE — ED Provider Notes (Signed)
Medical screening examination/treatment/procedure(s) were performed by non-physician practitioner and as supervising physician I was immediately available for consultation/collaboration. Torrin Frein, MD, FACEP   Delene Morais L Gerry Heaphy, MD 05/18/11 1610 

## 2011-05-20 ENCOUNTER — Emergency Department (HOSPITAL_COMMUNITY)
Admission: EM | Admit: 2011-05-20 | Discharge: 2011-05-20 | Discharge status: Against Medical Advice | Payer: Medicaid Other | Attending: Emergency Medicine | Admitting: Emergency Medicine

## 2011-05-20 DIAGNOSIS — R109 Unspecified abdominal pain: Secondary | ICD-10-CM | POA: Insufficient documentation

## 2012-01-06 ENCOUNTER — Encounter (INDEPENDENT_AMBULATORY_CARE_PROVIDER_SITE_OTHER): Payer: Self-pay

## 2012-01-21 ENCOUNTER — Encounter (INDEPENDENT_AMBULATORY_CARE_PROVIDER_SITE_OTHER): Payer: Self-pay | Admitting: Surgery

## 2012-01-30 ENCOUNTER — Ambulatory Visit (INDEPENDENT_AMBULATORY_CARE_PROVIDER_SITE_OTHER): Payer: Medicaid Other | Admitting: Surgery

## 2012-02-01 ENCOUNTER — Emergency Department (HOSPITAL_COMMUNITY)
Admission: EM | Admit: 2012-02-01 | Discharge: 2012-02-02 | Disposition: A | Discharge status: Home / Self Care | Payer: Self-pay | Attending: Emergency Medicine | Admitting: Emergency Medicine

## 2012-02-01 ENCOUNTER — Encounter (HOSPITAL_COMMUNITY): Payer: Self-pay | Admitting: *Deleted

## 2012-02-01 DIAGNOSIS — R51 Headache: Secondary | ICD-10-CM | POA: Insufficient documentation

## 2012-02-01 DIAGNOSIS — F329 Major depressive disorder, single episode, unspecified: Secondary | ICD-10-CM | POA: Insufficient documentation

## 2012-02-01 DIAGNOSIS — F3289 Other specified depressive episodes: Secondary | ICD-10-CM | POA: Insufficient documentation

## 2012-02-01 DIAGNOSIS — R519 Headache, unspecified: Secondary | ICD-10-CM

## 2012-02-01 DIAGNOSIS — R112 Nausea with vomiting, unspecified: Secondary | ICD-10-CM | POA: Insufficient documentation

## 2012-02-01 DIAGNOSIS — F17200 Nicotine dependence, unspecified, uncomplicated: Secondary | ICD-10-CM | POA: Insufficient documentation

## 2012-02-01 MED ORDER — METOCLOPRAMIDE HCL 5 MG/ML IJ SOLN
10.0000 mg | Freq: Once | INTRAMUSCULAR | Status: AC
  Administered 2012-02-01: 10 mg via INTRAVENOUS
  Filled 2012-02-01: qty 2

## 2012-02-01 MED ORDER — DIPHENHYDRAMINE HCL 50 MG/ML IJ SOLN
25.0000 mg | Freq: Once | INTRAMUSCULAR | Status: AC
  Administered 2012-02-01: 25 mg via INTRAVENOUS
  Filled 2012-02-01: qty 2

## 2012-02-01 MED ORDER — SODIUM CHLORIDE 0.9 % IV BOLUS (SEPSIS)
1000.0000 mL | Freq: Once | INTRAVENOUS | Status: AC
  Administered 2012-02-01: 1000 mL via INTRAVENOUS

## 2012-02-01 NOTE — ED Notes (Addendum)
Pt reports headache x 1 day. Nausea and vomiting x 1 day. 7-10 times. Pain is 10/10. "The pain goes around my head in a band and is behind my eyes". Non radiating. Denies numbness and tingling.  Denies light sensitivity.  Denies any other pain. A.O. X 4. Respirations even and regular. Vitals stable. NAD. Sitting up in bed watching tv. Family at bedside.

## 2012-02-01 NOTE — ED Provider Notes (Signed)
History     CSN: [id number]  Arrival date & time 02/01/12  2212   First MD Initiated Contact with Patient 02/01/12 2249      Chief Complaint  Patient presents with  . Headache    (Consider location/radiation/quality/duration/timing/severity/associated sxs/prior treatment) HPI Comments: Patient is a current everyday smoker with a history of headaches and depression presents emergency department with a chief complaint of headache.  Onset of symptoms began approximately this morning and has been associated with nausea and emesis x1 & photophobia.  Pain is described as a throbbing sensation bilaterally in the frontal regions.  Patient states is very similar to presentation to headaches she has had in the past.  She has tried to relieve pain with over-the-counter acetaminophen with only mild relief.  Patient denies any fevers, night sweats, chills, neck stiffness, recent head injury or trauma, the use of blood thinners, lightheadedness, weakness, disequilibrium or ataxia, dizziness, change in vision or inability to ambulate.  Patient is a 36 y.o. female presenting with headaches. The history is provided by the patient.  Headache  Associated symptoms include nausea and vomiting. Pertinent negatives include no fever and no shortness of breath.    Past Medical History  Diagnosis Date  . Depression     History reviewed. No pertinent past surgical history.  Family History  Problem Relation Age of Onset  . Mental retardation Father     History  Substance Use Topics  . Smoking status: Current Everyday Smoker  . Smokeless tobacco: Not on file  . Alcohol Use: Yes    OB History    Grav Para Term Preterm Abortions TAB SAB Ect Mult Living                  Review of Systems  Constitutional: Positive for activity change. Negative for fever, chills, diaphoresis and fatigue.  HENT: Negative for ear pain, congestion, facial swelling, neck pain, neck stiffness, sinus pressure and tinnitus.    Eyes: Positive for photophobia. Negative for redness and visual disturbance.  Respiratory: Negative for cough, shortness of breath, wheezing and stridor.   Cardiovascular: Negative for chest pain.  Gastrointestinal: Positive for nausea and vomiting. Negative for abdominal pain.  Musculoskeletal: Negative for myalgias and gait problem.  Skin: Negative for rash.  Neurological: Positive for headaches. Negative for dizziness, syncope, speech difficulty, weakness, light-headedness and numbness.       No bowel or bladder incontinence.  Psychiatric/Behavioral: Negative for confusion.  All other systems reviewed and are negative.    Allergies  Review of patient's allergies indicates no known allergies.  Home Medications  No current outpatient prescriptions on file.  BP 109/85  Pulse 61  Temp 98.3 F (36.8 C) (Oral)  Resp 16  SpO2 100%  Physical Exam  Nursing note and vitals reviewed. Constitutional: She is oriented to person, place, and time. She appears well-developed and well-nourished. No distress.  HENT:  Head: Normocephalic and atraumatic.  Right Ear: External ear normal.  Left Ear: External ear normal.  Eyes: Conjunctivae and EOM are normal. Pupils are equal, round, and reactive to light. Right eye exhibits no discharge. Left eye exhibits no discharge. Right conjunctiva is not injected. Right conjunctiva has no hemorrhage. Left conjunctiva is not injected. Left conjunctiva has no hemorrhage. No scleral icterus. Right eye exhibits no nystagmus. Left eye exhibits no nystagmus.  Neck: Normal range of motion and full passive range of motion without pain. Neck supple. No spinous process tenderness present. No rigidity.  Cardiovascular: Normal rate, regular  rhythm, normal heart sounds and intact distal pulses.   Pulmonary/Chest: Effort normal and breath sounds normal. No respiratory distress.  Musculoskeletal: Normal range of motion.  Neurological: She is alert and oriented to person,  place, and time. She has normal strength. No cranial nerve deficit or sensory deficit. Coordination and gait normal.  Skin: Skin is warm and dry. No rash noted. She is not diaphoretic.    ED Course  Procedures (including critical care time)  Labs Reviewed - No data to display No results found.   No diagnosis found.    MDM  Headache  Pt HA treated and improved while in ED.  Presentation is like pts typical HA and non concerning for [facility], ICH, Meningitis, or temporal arteritis. Pt is afebrile with no focal neuro deficits, nuchal rigidity, or change in vision. Pt is to follow up with PCP to discuss prophylactic medication. Pt verbalizes understanding and is agreeable with plan to dc.          Jaci Carrel, [place] 02/02/12 0111

## 2012-02-01 NOTE — ED Notes (Signed)
The pt has had a headache all day with nv.  History of headaches

## 2012-02-01 NOTE — ED Notes (Signed)
Patient currently sitting up in bed; no respiratory or acute distress noted.  Patient requesting warm blanket; warm blanket given.  Patient has no other questions or concerns at this time; will continue to monitor.

## 2012-02-02 MED ORDER — IBUPROFEN 800 MG PO TABS: 800.0000 mg | ORAL_TABLET | Freq: Two times a day (BID) | ORAL | Status: AC

## 2012-02-02 MED ORDER — KETOROLAC TROMETHAMINE 30 MG/ML IJ SOLN
30.0000 mg | Freq: Once | INTRAMUSCULAR | Status: AC
  Administered 2012-02-02: 30 mg via INTRAVENOUS
  Filled 2012-02-02: qty 1

## 2012-02-02 NOTE — ED Notes (Signed)
Pt. Verbalized understanding of medication dosage and administration. Verbalized need to follow up with primary care physician if symptoms worse or persist. Respirations even and regular. Vitals WNL. Ambulatory. Denies pain. A.O. X 4.

## 2012-02-03 NOTE — ED Provider Notes (Signed)
Medical screening examination/treatment/procedure(s) were performed by non-physician practitioner and as supervising physician I was immediately available for consultation/collaboration.  Fauna Neuner M Kyndle Schlender, MD 02/03/12 0643 

## 2012-02-12 ENCOUNTER — Encounter (INDEPENDENT_AMBULATORY_CARE_PROVIDER_SITE_OTHER): Payer: Self-pay | Admitting: Surgery

## 2012-09-21 ENCOUNTER — Emergency Department (HOSPITAL_COMMUNITY)
Admission: EM | Admit: 2012-09-21 | Discharge: 2012-09-21 | Disposition: A | Discharge status: Home / Self Care | Payer: Medicaid Other | Source: Home / Self Care

## 2012-09-21 ENCOUNTER — Other Ambulatory Visit (HOSPITAL_COMMUNITY)
Admission: RE | Admit: 2012-09-21 | Discharge: 2012-09-21 | Disposition: A | Discharge status: Home / Self Care | Payer: Medicaid Other | Source: Ambulatory Visit | Attending: Family Medicine | Admitting: Family Medicine

## 2012-09-21 ENCOUNTER — Encounter (HOSPITAL_COMMUNITY): Payer: Self-pay | Admitting: *Deleted

## 2012-09-21 DIAGNOSIS — Z202 Contact with and (suspected) exposure to infections with a predominantly sexual mode of transmission: Secondary | ICD-10-CM

## 2012-09-21 DIAGNOSIS — N898 Other specified noninflammatory disorders of vagina: Secondary | ICD-10-CM

## 2012-09-21 DIAGNOSIS — Z113 Encounter for screening for infections with a predominantly sexual mode of transmission: Secondary | ICD-10-CM | POA: Insufficient documentation

## 2012-09-21 DIAGNOSIS — N760 Acute vaginitis: Secondary | ICD-10-CM | POA: Insufficient documentation

## 2012-09-21 LAB — POCT URINALYSIS DIP (DEVICE)
Bilirubin Urine: NEGATIVE
Glucose, UA: NEGATIVE mg/dL
Leukocytes, UA: NEGATIVE
Nitrite: NEGATIVE
Specific Gravity, Urine: 1.02 (ref 1.005–1.030)
Urobilinogen, UA: 0.2 mg/dL (ref 0.0–1.0)

## 2012-09-21 LAB — POCT PREGNANCY, URINE: Preg Test, Ur: NEGATIVE

## 2012-09-21 NOTE — ED Provider Notes (Signed)
History     CSN: [id number]  Arrival date & time 09/21/12  1130   None     Chief Complaint  Patient presents with  . Abdominal Cramping    (Consider location/radiation/quality/duration/timing/severity/associated sxs/prior treatment) HPI Comments: 37 year old female states she was told to get checked for STDs. Her only complaint is that of a normal vaginal discharge. She also states that for several weeks he has had intermittent midabdominal discomfort. Nothing makes it worse nothing makes it better. No vomiting, diarrhea or constipation. She denies pelvic pain or cramping to me.   Past Medical History  Diagnosis Date  . Depression     History reviewed. No pertinent past surgical history.  Family History  Problem Relation Age of Onset  . Mental retardation Father     History  Substance Use Topics  . Smoking status: Current Every Day Smoker  . Smokeless tobacco: Not on file  . Alcohol Use: Yes    OB History   Grav Para Term Preterm Abortions TAB SAB Ect Mult Living                  Review of Systems  Constitutional: Negative for fever, chills, activity change, appetite change and fatigue.  HENT: Negative for facial swelling, neck pain and neck stiffness.   Eyes: Negative.   Respiratory: Negative.   Cardiovascular: Negative.   Gastrointestinal: Positive for abdominal pain.  Genitourinary: Negative for dysuria, flank pain, vaginal bleeding, vaginal pain and pelvic pain.  Skin: Negative for pallor and rash.  Neurological: Negative.     Allergies  Review of patient's allergies indicates no known allergies.  Home Medications  No current outpatient prescriptions on file.  BP 124/91  Pulse 64  Temp(Src) 98.1 F (36.7 C) (Oral)  Resp 16  SpO2 100%  LMP 09/17/2012  Physical Exam  Constitutional: She is oriented to person, place, and time. She appears well-developed and well-nourished. No distress.  HENT:  Head: Normocephalic and atraumatic.  Eyes: EOM  are normal.  Neck: Normal range of motion. Neck supple.  Cardiovascular: Normal rate and normal heart sounds.   Pulmonary/Chest: Effort normal and breath sounds normal. No respiratory distress.  Abdominal: Soft. Bowel sounds are normal. She exhibits no mass. There is no tenderness. There is no rebound and no guarding.  Genitourinary:  External female genitalia. With speculum was inserted and the cervix captured sound that there is moderate amount of white creamy discharge in the vaginal pool and coating the cervix. The cervix is left of midline and slightly anterior. The ectocervix had several pain in papules scattered in the linear fashion. And areas of erythema. No fluid exuding from the os. The os is parous.  Musculoskeletal: Normal range of motion.  Neurological: She is alert and oriented to person, place, and time. No cranial nerve deficit.  Skin: Skin is warm and dry.  Psychiatric: She has a normal mood and affect.    ED Course  Procedures (including critical care time)  Labs Reviewed  POCT URINALYSIS DIP (DEVICE) - Abnormal; Notable for the following:    Hgb urine dipstick TRACE (*)    All other components within normal limits  POCT PREGNANCY, URINE  CERVICOVAGINAL ANCILLARY ONLY   No results found.  Results for orders placed during the hospital encounter of 09/21/12  POCT URINALYSIS DIP (DEVICE)      Result Value Range   Glucose, UA NEGATIVE  NEGATIVE mg/dL   Bilirubin Urine NEGATIVE  NEGATIVE   Ketones, ur NEGATIVE  NEGATIVE mg/dL  Specific Gravity, Urine 1.020  1.005 - 1.030   Hgb urine dipstick TRACE (*) NEGATIVE   pH 6.5  5.0 - 8.0   Protein, ur NEGATIVE  NEGATIVE mg/dL   Urobilinogen, UA 0.2  0.0 - 1.0 mg/dL   Nitrite NEGATIVE  NEGATIVE   Leukocytes, UA NEGATIVE  NEGATIVE  POCT PREGNANCY, URINE      Result Value Range   Preg Test, Ur NEGATIVE  NEGATIVE      1. Vaginal discharge   2. Exposure to STD       MDM  Vaginal swabs obtained for ancillary STD  testing. Was results are back Will treat accordingly. No medications for today. Other than small amount of "normal" vaginal discharge she is asymptomatic.        Hayden Rasmussen, NP 09/21/12 1435  Etiology: Gardnerela. E-scribe flagyl 500 bid x 7 d. DMabe, NP  Hayden Rasmussen, NP 09/22/12 [phone]

## 2012-09-21 NOTE — ED Notes (Signed)
Pt  Has  Symptoms  Of  Low  abd  Pressure     descibed  As a  Cramp  As  Well  A s  A  Vaginal  Discharge  And  Vaginal itching    The   Pt  Reports  The  Symptoms  For  About 1  Week   She  Walks  Upright with a  Steady  Fluid  Gait   She  Appears  In no acute  Distress

## 2012-09-21 NOTE — Discharge Instructions (Signed)
Gonorrhea, Females and Males  Gonorrhea is an infection. Gonorrhea can be treated with medicines that kill germs (antibiotics). It is necessary that all your sexual partners also be tested for infection and possibly be treated.   CAUSES   Gonorrhea is caused by a germ (bacteria) called Neisseria gonorrhoeae. This infection is spread by sexual contact. The contact that spreads gonorrhea from person to person may be oral, anal, or genital sex.  SYMPTOMS   Females  A woman may have gonorrhea infection and no symptoms. The most common symptoms are:   Pain in the lower abdomen.   Fever, with or without chills.  When these are the most serious problems, the illness is commonly called pelvic inflammatory disease (PID). Other symptoms include:   Abnormal vaginal discharge.   Painful intercourse.   Burning or itching of the vagina or lips of the vagina.   Abnormal vaginal bleeding.   Pain when urinating.  If the infection is spread by anal sex:   Irritation, pain, bleeding, or discharge from the rectum.  If the infection is spread by oral sex with either a man or a woman:   Sore throat, fever, and swollen neck lymph glands.  Other problems may include:   Long-lasting (chronic) pain in the lower abdomen during menstruation, intercourse, or at other times.   Inability to become pregnant.   Premature birth.   Passing the infection onto a newborn baby. This can cause an eye infection in the infant or more serious health problems.  Males  Less frequently than in women, men may have gonorrhea infection and no symptoms. The most common symptoms are:   Discharge from the penis.   Pain or burning during urination.  If the infection is spread by anal sex:   Irritation, pain, bleeding, or discharge from the rectum.  If the infection is spread by oral sex with either a man or a woman:   Sore throat, fever, and swollen neck lymph glands.  DIAGNOSIS   Diagnosis is made by exam of the patient and checking a sample of  discharge under a microscope for the presence of the bacteria. Discharge may be taken from the urethra, cervix, throat, or rectum.  TREATMENT   It is important to diagnose and treat gonorrhea as soon as possible. This prevents damage to the female or female organs or harm to the newborn baby of an infected woman.   Antibiotics are used to treat gonorrhea.   Your sex partners should also be examined and treated if needed.   Testing and treatment for other sexually transmitted diseases (STDs) may be done when you are diagnosed with gonorrhea. Gonorrhea is an STD. You are at risk for other STDs, which are often transmitted around the same time as gonorrhea. These include:   Chlamydia.   Syphilis.   Trichomonas.   Human papillomavirus (HPV).   Human immunodeficiency virus (HIV).   If left untreated, PID can cause women to be unable to have children (sterile). To prevent sterility in females, it is important to be treated as soon as possible and finish all medicines. Unfortunately, sterility or pregnancy occurring outside the uterus (ectopic) may still occur in fully treated women.  HOME CARE INSTRUCTIONS    Finish all medicine as prescribed. Incomplete treatment will put you at risk for continued infection.   Only take over-the-counter or prescription medicines for pain, discomfort, or fever as directed by your caregiver.   Do not have sex until treatment is completed, or as instructed   out the results of your test Not all test results are available during your visit. If your test results are not back during the visit, make an appointment with your caregiver to find out the results. Do not assume everything is normal if you have not  heard from your caregiver or the medical facility. It is important for you to follow up on all of your test results. SEEK MEDICAL CARE IF:   You develop any bad reaction to the medicine you were prescribed. This may include:  Rash.  Nausea.  Vomiting.  Diarrhea.  You have an oral temperature above 102 F (38.9 C).  You have symptoms that do not improve, symptoms that get worse, or you develop increased pain. Males may get pain in the testicles and females may get increased abdominal pain. MAKE SURE YOU:   Understand these instructions.  Will watch your condition.  Will get help right away if you are not doing well or get worse. Document Released: 06/07/2000 Document Revised: 09/02/2011 Document Reviewed: 10/10/2009 [facility] Patient Information 2013 [place], [place].  Sexually Transmitted Disease Sexually transmitted disease (STD) refers to any infection that is passed from person to person during sexual activity. This may happen by way of saliva, semen, blood, vaginal mucus, or urine. Common STDs include:  Gonorrhea.  Chlamydia.  Syphilis.  HIV/AIDS.  Genital herpes.  Hepatitis B and C.  Trichomonas.  Human papillomavirus (HPV).  Pubic lice. CAUSES  An STD may be spread by bacteria, virus, or parasite. A person can get an STD by:  Sexual intercourse with an infected person.  Sharing sex toys with an infected person.  Sharing needles with an infected person.  Having intimate contact with the genitals, mouth, or rectal areas of an infected person. SYMPTOMS  Some people may not have any symptoms, but they can still pass the infection to others. Different STDs have different symptoms. Symptoms include:  Painful or bloody urination.  Pain in the pelvis, abdomen, vagina, anus, throat, or eyes.  Skin rash, itching, irritation, growths, or sores (lesions). These usually occur in the genital or anal area.  Abnormal vaginal discharge.  Penile discharge in  men.  Soft, flesh-colored skin growths in the genital or anal area.  Fever.  Pain or bleeding during sexual intercourse.  Swollen glands in the groin area.  Yellow skin and eyes (jaundice). This is seen with hepatitis. DIAGNOSIS  To make a diagnosis, your caregiver may:  Take a medical history.  Perform a physical exam.  Take a specimen (culture) to be examined.  Examine a sample of discharge under a microscope.  Perform blood tests.  Perform a Pap test, if this applies.  Perform a colposcopy.  Perform a laparoscopy. TREATMENT   Chlamydia, gonorrhea, trichomonas, and syphilis can be cured with antibiotic medicine.  Genital herpes, hepatitis, and HIV can be treated, but not cured, with prescribed medicines. The medicines will lessen the symptoms.  Genital warts from HPV can be treated with medicine or by freezing, burning (electrocautery), or surgery. Warts may come back.  HPV is a virus and cannot be cured with medicine or surgery.However, abnormal areas may be followed very closely by your caregiver and may be removed from the cervix, vagina, or vulva through office procedures or surgery. If your diagnosis is confirmed, your recent sexual partners need treatment. This is true even if they are symptom-free or have a negative culture or evaluation. They should not have sex until their caregiver says it is okay. HOME CARE INSTRUCTIONS  All sexual partners should be informed, tested, and treated for all STDs.  Take your antibiotics as directed. Finish them even if you start to feel better.  Only take over-the-counter or prescription medicines for pain, discomfort, or fever as directed by your caregiver.  Rest.  Eat a balanced diet and drink enough fluids to keep your urine clear or pale yellow.  Do not have sex until treatment is completed and you have followed up with your caregiver. STDs should be checked after treatment.  Keep all follow-up appointments, Pap  tests, and blood tests as directed by your caregiver.  Only use latex condoms and water-soluble lubricants during sexual activity. Do not use petroleum jelly or oils.  Avoid alcohol and illegal drugs.  Get vaccinated for HPV and hepatitis. If you have not received these vaccines in the past, talk to your caregiver about whether one or both might be right for you.  Avoid risky sex practices that can break the skin. The only way to avoid getting an STD is to avoid all sexual activity.Latex condoms and dental dams (for oral sex) will help lessen the risk of getting an STD, but will not completely eliminate the risk. SEEK MEDICAL CARE IF:   You have a fever.  You have any new or worsening symptoms. Document Released: 08/31/2002 Document Revised: 09/02/2011 Document Reviewed: 09/07/2010 [facility] Patient Information 2013 [place], [place].  Vaginitis Vaginitis is an infection. It causes soreness, swelling, and redness (inflammation) of the vagina. Many of these infections are sexually transmitted diseases (STDs). Having unprotected sex can cause further problems and complications such as:  Chronic pelvic pain.  Infertility.  Unwanted pregnancy.  Abortion.  Tubal pregnancy.  Infection passed on to the newborn.  Cancer. CAUSES   Monilia. This is a yeast or fungus infection, not an STD.  Bacterial vaginosis. The normal balance of bacteria in the vagina is disrupted and is replaced by an overgrowth of certain bacteria.  Gonorrhea, chlamydia. These are bacterial infections that are STDs.  Vaginal sponges, diaphragms, and intrauterine devices.  Trichomoniasis. This is a STD infection caused by a parasite.  Viruses like herpes and human papillomavirus. Both are STDs.  Pregnancy.  Immunosuppression. This occurs with certain conditions such as HIV infection or cancer.  Using bubble bath.  Taking certain antibiotic medicines.  Sporadic recurrence can occur if you become  sick.  Diabetes.  Steroids.  Allergic reaction. If you have an allergy to:  Douches.  Soaps.  Spermicides.  Condoms.  Scented tampons or vaginal sprays. SYMPTOMS   Abnormal vaginal discharge.  Itching of the vagina.  Pain in the vagina.  Swelling of the vagina. In some cases, there are no symptoms. TREATMENT  Treatment will vary depending on the type of infection.  Bacteria or trichomonas are usually treated with oral antibiotics and sometimes vaginal cream or suppositories.  Monilia vaginitis is usually treated with vaginal creams, suppositories, or oral antifungal pills.  Viral vaginitis has no cure. However, the symptoms of herpes (a viral vaginitis) can be treated to relieve the discomfort. Human papillomavirus has no symptoms. However, there are treatments for the diseases caused by human papillomavirus.  With allergic vaginitis, you need to stop using the product that is causing the problem. Vaginal creams can be used to treat the symptoms.  When treating an STD, the sex partner should also be treated. HOME CARE INSTRUCTIONS   Take all the medicines as directed by your caregiver.  Do not use scented tampons, soaps, or vaginal sprays.  Do  not douche.  Tell your sex partner if you have a vaginal infection or an STD.  Do not have sexual intercourse until you have treated the vaginitis.  Practice safe sex by using condoms. SEEK MEDICAL CARE IF:   You have abdominal pain.  Your symptoms get worse during treatment. Document Released: 04/07/2007 Document Revised: 09/02/2011 Document Reviewed: 12/01/2008 [facility] Patient Information 2013 [place], [place].

## 2012-09-22 MED ORDER — METRONIDAZOLE 500 MG PO TABS: 500.0000 mg | ORAL_TABLET | Freq: Two times a day (BID) | ORAL | Status: DC

## 2012-09-23 ENCOUNTER — Telehealth (HOSPITAL_COMMUNITY): Payer: Self-pay | Admitting: *Deleted

## 2012-09-23 NOTE — ED Provider Notes (Signed)
Medical screening examination/treatment/procedure(s) were performed by non-physician practitioner and as supervising physician I was immediately available for consultation/collaboration.   MORENO-COLL,Herma Uballe; MD  Hildreth Robart Moreno-Coll, MD 09/23/12 0608 

## 2012-09-23 NOTE — ED Notes (Signed)
GC/Chlamydia neg., Affirm: Gardnerella pos., Candida and Trich neg.  Hayden Rasmussen NP e-prescribed Flagyl to the [facility] on [address] on 4/1. I called pt. Pt. verified x 2 and given results.  Pt. Told she needs Flagyl for bacterial vaginosis and where to pick up her Rx.   Pt. instructed to no alcohol while taking this medication. Pt.'s questions answered.  Vassie Moselle 09/23/2012

## 2012-09-27 ENCOUNTER — Encounter (HOSPITAL_COMMUNITY): Payer: Self-pay | Admitting: *Deleted

## 2012-09-27 ENCOUNTER — Inpatient Hospital Stay (HOSPITAL_COMMUNITY)
Admission: AD | Admit: 2012-09-27 | Discharge: 2012-09-27 | Disposition: A | Discharge status: Home / Self Care | Payer: Medicaid Other | Source: Ambulatory Visit | Attending: Obstetrics & Gynecology | Admitting: Obstetrics & Gynecology

## 2012-09-27 DIAGNOSIS — K439 Ventral hernia without obstruction or gangrene: Secondary | ICD-10-CM | POA: Insufficient documentation

## 2012-09-27 DIAGNOSIS — B9689 Other specified bacterial agents as the cause of diseases classified elsewhere: Secondary | ICD-10-CM

## 2012-09-27 DIAGNOSIS — A499 Bacterial infection, unspecified: Secondary | ICD-10-CM

## 2012-09-27 DIAGNOSIS — R42 Dizziness and giddiness: Secondary | ICD-10-CM | POA: Insufficient documentation

## 2012-09-27 DIAGNOSIS — N760 Acute vaginitis: Secondary | ICD-10-CM

## 2012-09-27 HISTORY — DX: Benign neoplasm of connective and other soft tissue, unspecified: D21.9

## 2012-09-27 HISTORY — DX: Umbilical hernia without obstruction or gangrene: K42.9

## 2012-09-27 LAB — CBC
MCH: 31 pg (ref 26.0–34.0)
Platelets: 231 10*3/uL (ref 150–400)
RBC: 4.49 MIL/uL (ref 3.87–5.11)
WBC: 7.2 10*3/uL (ref 4.0–10.5)

## 2012-09-27 LAB — URINALYSIS, ROUTINE W REFLEX MICROSCOPIC
Nitrite: NEGATIVE
Specific Gravity, Urine: 1.01 (ref 1.005–1.030)
Urobilinogen, UA: 0.2 mg/dL (ref 0.0–1.0)

## 2012-09-27 LAB — POCT PREGNANCY, URINE: Preg Test, Ur: NEGATIVE

## 2012-09-27 MED ORDER — METRONIDAZOLE 500 MG PO TABS: 500.0000 mg | ORAL_TABLET | Freq: Two times a day (BID) | ORAL | Status: DC

## 2012-09-27 NOTE — MAU Provider Note (Signed)
History     CSN: [id number]  Arrival date and time: 09/27/12 1101   First Provider Initiated Contact with Patient 09/27/12 1214      Chief Complaint  Patient presents with  . Dizziness   HPI [name] is a 37 y.o. [id number] who presents to MAU today with complaint of dizziness. The patient states that she has been having "dizzy spells" since Friday. She denies LOC. She is also having mid abdominal pain. She has a previously diagnosed ventral hernia. She denies vaginal bleeding, N/V or fever. She has had some vaginal discharge. She was recently diagnosed with BV at urgent care and has not been able to pick up her Flagyl yet. She denies history of DM or HTN  OB History   Grav Para Term Preterm Abortions TAB SAB Ect Mult Living   5 4 4  1  1   4       Past Medical History  Diagnosis Date  . Depression   . Hernia, umbilical   . Fibroid     Past Surgical History  Procedure Laterality Date  . Tubal ligation      Family History  Problem Relation Age of Onset  . Mental retardation Father     History  Substance Use Topics  . Smoking status: Current Every Day Smoker  . Smokeless tobacco: Not on file  . Alcohol Use: Yes    Allergies: No Known Allergies  Prescriptions prior to admission  Medication Sig Dispense Refill  . metroNIDAZOLE (FLAGYL) 500 MG tablet Take 1 tablet (500 mg total) by mouth 2 (two) times daily. X 7 days  14 tablet  0    Review of Systems  Constitutional: Negative for fever and malaise/fatigue.  Gastrointestinal: Positive for abdominal pain. Negative for nausea, vomiting, diarrhea and constipation.  Genitourinary: Negative for dysuria, urgency and frequency.       Neg - vaginal bleeding + vaginal discharge  Neurological: Positive for dizziness. Negative for loss of consciousness and weakness.   Physical Exam   Blood pressure 107/77, pulse 50, temperature 98.1 F (36.7 C), temperature source Oral, resp. rate 18, height 4\' 7"  (1.397 m),  weight 145 lb 9.6 oz (66.044 kg), last menstrual period 09/17/2012.  Physical Exam  Constitutional: She is oriented to person, place, and time. She appears well-developed and well-nourished. No distress.  HENT:  Head: Normocephalic and atraumatic.  Cardiovascular: Normal rate, regular rhythm and normal heart sounds.   Respiratory: Effort normal and breath sounds normal. No respiratory distress.  GI: Soft. Bowel sounds are normal. She exhibits no distension and no mass. There is tenderness (mid abdominal pain). There is no rebound and no guarding. A hernia is present. Hernia confirmed positive in the ventral area (just above the umbilicus).  Genitourinary: Uterus is not enlarged and not tender. Right adnexum displays tenderness. Right adnexum displays no mass. Left adnexum displays tenderness. Left adnexum displays no mass.  Neurological: She is alert and oriented to person, place, and time.  Skin: Skin is warm and dry. No erythema.  Psychiatric: She has a normal mood and affect.  Pelvic exam deferred as patient was recently diagnosed with BV and had all testing done at that time.   Results for orders placed during the hospital encounter of 09/27/12 (from the past 24 hour(s))  URINALYSIS, ROUTINE W REFLEX MICROSCOPIC     Status: None   Collection Time    09/27/12 11:40 AM      Result Value Range   Color, Urine  YELLOW  YELLOW   APPearance CLEAR  CLEAR   Specific Gravity, Urine 1.010  1.005 - 1.030   pH 6.5  5.0 - 8.0   Glucose, UA NEGATIVE  NEGATIVE mg/dL   Hgb urine dipstick NEGATIVE  NEGATIVE   Bilirubin Urine NEGATIVE  NEGATIVE   Ketones, ur NEGATIVE  NEGATIVE mg/dL   Protein, ur NEGATIVE  NEGATIVE mg/dL   Urobilinogen, UA 0.2  0.0 - 1.0 mg/dL   Nitrite NEGATIVE  NEGATIVE   Leukocytes, UA NEGATIVE  NEGATIVE  CBC     Status: Abnormal   Collection Time    09/27/12 12:37 PM      Result Value Range   WBC 7.2  4.0 - 10.5 K/uL   RBC 4.49  3.87 - 5.11 MIL/uL   Hemoglobin 13.9  12.0 -  15.0 g/dL   HCT 47.8  29.5 - 62.1 %   MCV 84.2  78.0 - 100.0 fL   MCH 31.0  26.0 - 34.0 pg   MCHC 36.8 (*) 30.0 - 36.0 g/dL   RDW 30.8  65.7 - 84.6 %   Platelets 231  150 - 400 K/uL   UPT - Negative CBG - 84  Patient Vitals for the past 24 hrs:  BP Temp Temp src Pulse Resp Height Weight  09/27/12 1400 105/61 mmHg 97.4 F (36.3 C) Oral - 18 - -  09/27/12 1324 109/70 mmHg - - 81 - - -  09/27/12 1323 108/77 mmHg - - 53 - - -  09/27/12 1322 112/64 mmHg - - 52 18 - -  09/27/12 1211 107/77 mmHg - - 50 - - -  09/27/12 1210 113/72 mmHg - - 49 - - -  09/27/12 1139 108/75 mmHg 98.1 F (36.7 C) Oral 54 18 4\' 7"  (1.397 m) 145 lb 9.6 oz (66.044 kg)    MAU Course  Procedures None  MDM Discussed with Dr. Macon Large. No GYN reason for symptoms. No emergent conditions today. Patient to follow-up with PCP or [facility] or [facility] for further evaluation if symptoms continue.  No evidence of anemia, orthostatic hypotension, hypoglycemia or infection today.   Assessment and Plan  A: Ventral Hernia Bacterial vaginosis, previously diagnosed Dizziness  P: Discharge home Encouraged patient to fill Rx for Flagyl and complete treatment Patient encouraged to contact [facility] or [facility] outpatient for PCP establishment Patient may return to MAU as needed, but has been advised that she may be better served at [facility] or [facility]  Freddi Starr, PA-C 09/27/2012, 12:15 PM

## 2012-09-27 NOTE — MAU Note (Signed)
Pt reports she had 2 episiods of feeling faint. Did not pass out. Last one was this morning. Reports having nausea as well.

## 2012-09-27 NOTE — MAU Note (Signed)
Friday pt felt lightheaded, dizzy, nauseated and had to lay on the bathroom floor for one hour. Today pt reports pains in her stomach. Pt reports having regular BM.

## 2012-09-27 NOTE — MAU Provider Note (Signed)
Attestation of Attending Supervision of Advanced Practitioner (PA/CNM/NP): Evaluation and management procedures were performed by the Advanced Practitioner under my supervision and collaboration.  I have reviewed the Advanced Practitioner's note and chart, and I agree with the management and plan.  Floree Zuniga, MD, FACOG Attending Obstetrician & Gynecologist Faculty Practice, [facility]  

## 2013-06-25 ENCOUNTER — Ambulatory Visit: Payer: Self-pay | Admitting: Podiatrist

## 2013-07-24 ENCOUNTER — Emergency Department (HOSPITAL_COMMUNITY)
Admission: EM | Admit: 2013-07-24 | Discharge: 2013-07-24 | Disposition: A | Discharge status: Home / Self Care | Payer: Medicaid Other | Attending: Emergency Medicine | Admitting: Emergency Medicine

## 2013-07-24 ENCOUNTER — Encounter (HOSPITAL_COMMUNITY): Payer: Self-pay | Admitting: Emergency Medicine

## 2013-07-24 DIAGNOSIS — IMO0002 Reserved for concepts with insufficient information to code with codable children: Secondary | ICD-10-CM | POA: Insufficient documentation

## 2013-07-24 DIAGNOSIS — Z9851 Tubal ligation status: Secondary | ICD-10-CM | POA: Insufficient documentation

## 2013-07-24 DIAGNOSIS — R3915 Urgency of urination: Secondary | ICD-10-CM | POA: Insufficient documentation

## 2013-07-24 DIAGNOSIS — Z8719 Personal history of other diseases of the digestive system: Secondary | ICD-10-CM | POA: Insufficient documentation

## 2013-07-24 DIAGNOSIS — F17200 Nicotine dependence, unspecified, uncomplicated: Secondary | ICD-10-CM | POA: Insufficient documentation

## 2013-07-24 DIAGNOSIS — R350 Frequency of micturition: Secondary | ICD-10-CM | POA: Insufficient documentation

## 2013-07-24 DIAGNOSIS — Z791 Long term (current) use of non-steroidal anti-inflammatories (NSAID): Secondary | ICD-10-CM | POA: Insufficient documentation

## 2013-07-24 DIAGNOSIS — R1084 Generalized abdominal pain: Secondary | ICD-10-CM | POA: Insufficient documentation

## 2013-07-24 DIAGNOSIS — X500XXA Overexertion from strenuous movement or load, initial encounter: Secondary | ICD-10-CM | POA: Insufficient documentation

## 2013-07-24 DIAGNOSIS — Y9389 Activity, other specified: Secondary | ICD-10-CM | POA: Insufficient documentation

## 2013-07-24 DIAGNOSIS — Z8659 Personal history of other mental and behavioral disorders: Secondary | ICD-10-CM | POA: Insufficient documentation

## 2013-07-24 DIAGNOSIS — R319 Hematuria, unspecified: Secondary | ICD-10-CM

## 2013-07-24 DIAGNOSIS — M545 Low back pain, unspecified: Secondary | ICD-10-CM

## 2013-07-24 DIAGNOSIS — Z3202 Encounter for pregnancy test, result negative: Secondary | ICD-10-CM | POA: Insufficient documentation

## 2013-07-24 DIAGNOSIS — Y929 Unspecified place or not applicable: Secondary | ICD-10-CM | POA: Insufficient documentation

## 2013-07-24 DIAGNOSIS — N898 Other specified noninflammatory disorders of vagina: Secondary | ICD-10-CM | POA: Insufficient documentation

## 2013-07-24 DIAGNOSIS — Z8742 Personal history of other diseases of the female genital tract: Secondary | ICD-10-CM | POA: Insufficient documentation

## 2013-07-24 LAB — URINALYSIS, ROUTINE W REFLEX MICROSCOPIC
BILIRUBIN URINE: NEGATIVE
GLUCOSE, UA: NEGATIVE mg/dL
KETONES UR: NEGATIVE mg/dL
LEUKOCYTES UA: NEGATIVE
NITRITE: NEGATIVE
PH: 5 (ref 5.0–8.0)
Protein, ur: NEGATIVE mg/dL
SPECIFIC GRAVITY, URINE: 1.029 (ref 1.005–1.030)
Urobilinogen, UA: 0.2 mg/dL (ref 0.0–1.0)

## 2013-07-24 LAB — URINE MICROSCOPIC-ADD ON

## 2013-07-24 LAB — WET PREP, GENITAL
CLUE CELLS WET PREP: NONE SEEN
TRICH WET PREP: NONE SEEN
WBC WET PREP: NONE SEEN
Yeast Wet Prep HPF POC: NONE SEEN

## 2013-07-24 LAB — CBC
HEMATOCRIT: 36.2 % (ref 36.0–46.0)
HEMOGLOBIN: 13.6 g/dL (ref 12.0–15.0)
MCH: 31.9 pg (ref 26.0–34.0)
MCHC: 37.6 g/dL — ABNORMAL HIGH (ref 30.0–36.0)
MCV: 84.8 fL (ref 78.0–100.0)
Platelets: 228 10*3/uL (ref 150–400)
RBC: 4.27 MIL/uL (ref 3.87–5.11)
RDW: 13.1 % (ref 11.5–15.5)
WBC: 7.1 10*3/uL (ref 4.0–10.5)

## 2013-07-24 LAB — BASIC METABOLIC PANEL
BUN: 10 mg/dL (ref 6–23)
CHLORIDE: 100 meq/L (ref 96–112)
CO2: 24 meq/L (ref 19–32)
CREATININE: 0.76 mg/dL (ref 0.50–1.10)
Calcium: 9.7 mg/dL (ref 8.4–10.5)
GFR calc Af Amer: >90 mL/min (ref >90)
GFR calc non Af Amer: >90 mL/min (ref >90)
GLUCOSE: 99 mg/dL (ref 70–99)
POTASSIUM: 4 meq/L (ref 3.7–5.3)
Sodium: 139 mEq/L (ref 137–147)

## 2013-07-24 LAB — PREGNANCY, URINE: Preg Test, Ur: NEGATIVE

## 2013-07-24 MED ORDER — HYDROCODONE-ACETAMINOPHEN 5-325 MG PO TABS: 1.0000 | ORAL_TABLET | ORAL | Status: DC | PRN

## 2013-07-24 MED ORDER — METHOCARBAMOL 500 MG PO TABS: 500.0000 mg | ORAL_TABLET | Freq: Two times a day (BID) | ORAL | Status: DC

## 2013-07-24 MED ORDER — DIAZEPAM 5 MG PO TABS
5.0000 mg | ORAL_TABLET | Freq: Once | ORAL | Status: AC
  Administered 2013-07-24: 5 mg via ORAL
  Filled 2013-07-24: qty 1

## 2013-07-24 MED ORDER — KETOROLAC TROMETHAMINE 60 MG/2ML IM SOLN
60.0000 mg | Freq: Once | INTRAMUSCULAR | Status: AC
  Administered 2013-07-24: 60 mg via INTRAMUSCULAR
  Filled 2013-07-24: qty 2

## 2013-07-24 NOTE — ED Provider Notes (Signed)
CSN: [id number]     Arrival date & time 07/24/13  1701 History   First MD Initiated Contact with Patient 07/24/13 1731     Chief Complaint  Patient presents with  . Back Pain   (Consider location/radiation/quality/duration/timing/severity/associated sxs/prior Treatment) The history is provided by the patient and medical records. No language interpreter was used.    Renee Herrera is a 38 y.o. female  with a hx of depression, uterine fibroid, BTL presents to the Emergency Department complaining of gradual, waxing and waning generalized abd pain worse in the AM and decreasing abd pain onset 1.5 weeks ago.  Nothing makes that pain better or worse.  Pt reports eating and drinking normally.  Pt denies N/V/D.  2 days ago pt bent over to put her clothes on and began to experience low back pain which has persisted since that time.  She has not attempted any OTC treatments for the abd pain or back pain.  Pt denies a hx of back problems.   Associated symptoms include urinary frequency and urgency.  Bending and moving makes the back pain worse and nothing makes it better though she tried a hot bath.  Pt denies fever, chills, headache, neck pain, CP, SOB, N/V/D, weakness, dizziness, syncope, dysuria.  Pt also denies vaginal pain and itching, but endorses vaginal discharge. Pt is sexually active with 1 female partner for the last 5 months. Hx of chlamydia 2 years and was treated for it.       Past Medical History  Diagnosis Date  . Depression   . Hernia, umbilical   . Fibroid    Past Surgical History  Procedure Laterality Date  . Tubal ligation     Family History  Problem Relation Age of Onset  . Mental retardation Father    History  Substance Use Topics  . Smoking status: Current Every Day Smoker  . Smokeless tobacco: Not on file  . Alcohol Use: Yes   OB History   Grav Para Term Preterm Abortions TAB SAB Ect Mult Living   5 4 4  1  1   4      Review of Systems  Constitutional: Negative for  fever, diaphoresis, appetite change, fatigue and unexpected weight change.  HENT: Negative for mouth sores.   Eyes: Negative for visual disturbance.  Respiratory: Negative for cough, chest tightness, shortness of breath and wheezing.   Cardiovascular: Negative for chest pain.  Gastrointestinal: Positive for abdominal pain (vague, generalized). Negative for nausea, vomiting, diarrhea and constipation.  Endocrine: Negative for polydipsia, polyphagia and polyuria.  Genitourinary: Positive for urgency and frequency. Negative for dysuria and hematuria.  Musculoskeletal: Positive for back pain (low, left sided). Negative for neck stiffness.  Skin: Negative for rash.  Allergic/Immunologic: Negative for immunocompromised state.  Neurological: Negative for syncope, light-headedness and headaches.  Hematological: Does not bruise/bleed easily.  Psychiatric/Behavioral: Negative for sleep disturbance. The patient is not nervous/anxious.     Allergies  Review of patient's allergies indicates no known allergies.  Home Medications   Current Outpatient Rx  Name  Route  Sig  Dispense  Refill  . HYDROcodone-acetaminophen (NORCO/VICODIN) 5-325 MG per tablet   Oral   Take 1-2 tablets by mouth every 4 (four) hours as needed.   15 tablet   0   . methocarbamol (ROBAXIN) 500 MG tablet   Oral   Take 1 tablet (500 mg total) by mouth 2 (two) times daily.   20 tablet   0    BP 104/69  Pulse 56  Temp(Src) 97.9 F (36.6 C) (Oral)  Resp 16  Ht 5\' 6"  (1.676 m)  Wt 135 lb (61.236 kg)  BMI 21.80 kg/m2  SpO2 90%  LMP 07/06/2013 Physical Exam  Nursing note and vitals reviewed. Constitutional: She is oriented to person, place, and time. She appears well-developed and well-nourished. No distress.  Awake, alert, nontoxic appearance  HENT:  Head: Normocephalic and atraumatic.  Mouth/Throat: Oropharynx is clear and moist. No oropharyngeal exudate.  Eyes: Conjunctivae are normal. No scleral icterus.   Neck: Normal range of motion. Neck supple.  Full ROM without pain  Cardiovascular: Normal rate, regular rhythm, normal heart sounds and intact distal pulses.   No murmur heard. Regular rate and rhythm  Pulmonary/Chest: Effort normal and breath sounds normal. No respiratory distress. She has no wheezes.  Clear and equal breath sounds  Abdominal: Soft. Bowel sounds are normal. She exhibits no distension and no mass. There is no hepatosplenomegaly. There is no tenderness. There is no rebound, no guarding and no CVA tenderness. Hernia confirmed negative in the right inguinal area and confirmed negative in the left inguinal area.  Abdomen soft and nontender No rigidity or guarding No CVA tenderness  Genitourinary: Uterus normal. Pelvic exam was performed with patient supine. There is no rash, tenderness, lesion or injury on the right labia. There is no rash, tenderness, lesion or injury on the left labia. Uterus is not deviated, not enlarged, not fixed and not tender. Cervix exhibits no motion tenderness, no discharge and no friability. Right adnexum displays no mass, no tenderness and no fullness. Left adnexum displays no mass, no tenderness and no fullness. No erythema, tenderness or bleeding around the vagina. No foreign body around the vagina. No signs of injury around the vagina. Vaginal discharge (thin, white, non-odorous) found.  Musculoskeletal: Normal range of motion. She exhibits no edema.  Full range of motion of the T-spine and L-spine No tenderness to palpation of the spinous processes of the T-spine or L-spine Mild tenderness to palpation of the left paraspinous muscles of the L-spine  Lymphadenopathy:    She has no cervical adenopathy.       Right: No inguinal adenopathy present.       Left: No inguinal adenopathy present.  Neurological: She is alert and oriented to person, place, and time. She has normal reflexes. She exhibits normal muscle tone. Coordination normal.  Speech is  clear and goal oriented, follows commands Normal strength in upper and lower extremities bilaterally including dorsiflexion and plantar flexion, strong and equal grip strength Sensation normal to light and sharp touch Moves extremities without ataxia, coordination intact Normal gait Normal balance  Skin: Skin is warm and dry. No rash noted. She is not diaphoretic. No erythema.  Psychiatric: She has a normal mood and affect. Her behavior is normal.    ED Course  Procedures (including critical care time) Labs Review Labs Reviewed  URINALYSIS, ROUTINE W REFLEX MICROSCOPIC - Abnormal; Notable for the following:    Color, Urine AMBER (*)    Hgb urine dipstick MODERATE (*)    All other components within normal limits  CBC - Abnormal; Notable for the following:    MCHC 37.6 (*)    All other components within normal limits  WET PREP, GENITAL  GC/CHLAMYDIA PROBE AMP  PREGNANCY, URINE  URINE MICROSCOPIC-ADD ON  BASIC METABOLIC PANEL   Imaging Review No results found.  EKG Interpretation   None       MDM   1. Hematuria  2. Low back pain      Nehemiah A Barkus presents with 1.5 weeks of vague abdominal pain with benign abdominal exam.  Her back pain is worse with palpation and movement and is reproducible on exam. No CVA tenderness.  Pelvic exam with thin white nonodorous discharge.  Will send a wet prep, GC Chlamydia cultures and basic labs.  Low likelihood of appendicitis or pyelonephritis.    9:01 PM Patient with improvement of pain after Toradol and Valium.  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  Basic labs reassuring. Pregnancy test negative and wet prep normal.  UA without evidence of urinary tract infection but moderate hemoglobin; no evidence of pyelonephritis. Discussed with patient that this could be potentially from a kidney stone but that she should see her primary care physician this week for reevaluation and recheck.   Repeat done on exam remains benign, no indication of diverticulitis, appendicitis, cholecystitis.  Back pain likely musculoskeletal due to history and physical.  We'll discharge home with pain control and muscle relaxers.  No neurological deficits and normal neuro exam.  Patient can walk but states is painful.  No loss of bowel or bladder control.  No concern for cauda equina.  No fever, night sweats, weight loss, h/o cancer, IVDU.  RICE protocol and pain medicine indicated and discussed with patient.   It has been determined that no acute conditions requiring further emergency intervention are present at this time. The patient/guardian have been advised of the diagnosis and plan. We have discussed signs and symptoms that warrant return to the ED, such as changes or worsening in symptoms.   Vital signs are stable at discharge.   BP 104/69  Pulse 56  Temp(Src) 97.9 F (36.6 C) (Oral)  Resp 16  Ht 5\' 6"  (1.676 m)  Wt 135 lb (61.236 kg)  BMI 21.80 kg/m2  SpO2 90%  LMP 07/06/2013  Patient/guardian has voiced understanding and agreed to follow-up with the PCP or specialist.       Abigail Butts, PA-C 07/25/13 [id number]

## 2013-07-24 NOTE — Discharge Instructions (Signed)
1. Medications: robaxin, ibuprofen, vicodin, usual home medications °2. Treatment: rest, drink plenty of fluids, gentle stretching as discussed, alternate ice and heat °3. Follow Up: Please followup with your primary doctor for discussion of your diagnoses and further evaluation after today's visit; if you do not have a primary care doctor use the resource guide provided to find one; ° ° ° °Back Exercises °Back exercises help treat and prevent back injuries. The goal of back exercises is to increase the strength of your abdominal and back muscles and the flexibility of your back. These exercises should be started when you no longer have back pain. Back exercises include: °· Pelvic Tilt. Lie on your back with your knees bent. Tilt your pelvis until the lower part of your back is against the floor. Hold this position 5 to 10 sec and repeat 5 to 10 times. °· Knee to Chest. Pull first 1 knee up against your chest and hold for 20 to 30 seconds, repeat this with the other knee, and then both knees. This may be done with the other leg straight or bent, whichever feels better. °· Sit-Ups or Curl-Ups. Bend your knees 90 degrees. Start with tilting your pelvis, and do a partial, slow sit-up, lifting your trunk only 30 to 45 degrees off the floor. Take at least 2 to 3 seconds for each sit-up. Do not do sit-ups with your knees out straight. If partial sit-ups are difficult, simply do the above but with only tightening your abdominal muscles and holding it as directed. °· Hip-Lift. Lie on your back with your knees flexed 90 degrees. Push down with your feet and shoulders as you raise your hips a couple inches off the floor; hold for 10 seconds, repeat 5 to 10 times. °· Back arches. Lie on your stomach, propping yourself up on bent elbows. Slowly press on your hands, causing an arch in your low back. Repeat 3 to 5 times. Any initial stiffness and discomfort should lessen with repetition over time. °· Shoulder-Lifts. Lie face down  with arms beside your body. Keep hips and torso pressed to floor as you slowly lift your head and shoulders off the floor. °Do not overdo your exercises, especially in the beginning. Exercises may cause you some mild back discomfort which lasts for a few minutes; however, if the pain is more severe, or lasts for more than 15 minutes, do not continue exercises until you see your caregiver. Improvement with exercise therapy for back problems is slow.  °See your caregivers for assistance with developing a proper back exercise program. °Document Released: 07/18/2004 Document Revised: 09/02/2011 Document Reviewed: 04/11/2011 °ExitCare® Patient Information ©2014 [employer]. ° ° ° °Emergency Department Resource Guide °1) Find a Doctor and Pay Out of Pocket °Although you won't have to find out who is covered by your insurance plan, it is a good idea to ask around and get recommendations. You will then need to call the office and see if the doctor you have chosen will accept you as a new patient and what types of options they offer for patients who are self-pay. Some doctors offer discounts or will set up payment plans for their patients who do not have insurance, but you will need to ask so you aren't surprised when you get to your appointment. ° °2) Contact Your Local Health Department °Not all health departments have doctors that can see patients for sick visits, but many do, so it is worth a call to see if yours does. If you   don't know where your local health department is, you can check in your phone book. The CDC also has a tool to help you locate your state's health department, and many state websites also have listings of all of their local health departments. ° °3) Find a Walk-in Clinic °If your illness is not likely to be very severe or complicated, you may want to try a walk in clinic. These are popping up all over the country in pharmacies, drugstores, and shopping centers. They're usually staffed by nurse  practitioners or physician assistants that have been trained to treat common illnesses and complaints. They're usually fairly quick and inexpensive. However, if you have serious medical issues or chronic medical problems, these are probably not your best option. ° °No Primary Care Doctor: °- Call [facility] at  832-8000 - they can help you locate a primary care doctor that  accepts your insurance, provides certain services, etc. °- Physician Referral Service- 1-800-533-3463 ° °Chronic Pain Problems: °Organization         Address  Phone   Notes  °[facility]  (336) 297-2271 Patients need to be referred by their primary care doctor.  ° °Medication Assistance: °Organization         Address  Phone   Notes  °[place] [facility] [address] °[place], [place] [address] (336) 641-8030 --Must be a resident of [place] °-- Must have NO insurance coverage whatsoever (no Medicaid/ Medicare, etc.) °-- The pt. MUST have a primary care doctor that directs their care regularly and follows them in the community °  °[facility]  (866) 331-1348   °[employer]  (888) 892-1162   ° °Agencies that provide inexpensive medical care: °Organization         Address  Phone   Notes  °[facility]  (336) 832-8035   °[facility]    (336) 832-7272   °[facility] [address] °[place], [place] [address] (336) 832-4777   °[facility] [address], °[place] (336) 271-4999   °[facility]    (336) 373-0678   °[facility]    (336) 272-1050   °[facility] ° [address], [place] Phone:  (336) 832-4444, Fax:  (336) 832-4440 Hours of Operation:  9 am - 6 pm, M-F.  Also accepts Medicaid/Medicare and self-pay.  °[facility] ° [address], [place] Phone: (336) 832-3150, Fax: (336) 832-3151. Hours of Operation:  8:30 am -  5:30 pm, M-F.  Also accepts Medicaid and self-pay.  °[facility] [place] [address], [place] Phone: (336) 878-6027   °[facility] [address], [place], [place] (336)723-1848, Ext. 123 Mondays & Thursdays: 7-9 AM.  First 15 patients are seen on a first come, first serve basis. °  ° °Medicaid-accepting [place] Providers: ° °Organization         Address  Phone   Notes  °[facility] [address], [place] (336) 641-2100 Also accepts self-pay patients.  °[facility] [address], [place] ° (336) 856-9996   °[facility] [address], [place] (336) 288-8857   °[facility] [address] [place] [address], [place] (336) 299-7000   °Veita Bland [address],   ° (336) [phone] Only accepts [place]   Access Medicaid patients after they have their name applied to their card.  ° °Self-Pay (no insurance) in [place]: ° °Organization         Address  Phone   Notes  °Sickle Cell Patients, [facility] [address], [place] (336) 832-1970   °[facility] [address], [place] (336) 832-4400   °[facility] [place] ° [address], [place] (336) 992-4800   °[facility]/Dr. Osei-Bonsu ° [address] [place] [address], [place] or [address], [place] (336) [phone] Phone number for both [place] and [place] locations is the same.  °[facility] [address], [place] (336) 299-0000   °[facility] [address] [place] [address], [place] or [address] (336) 852-7530 °(336) 878-2260   °[facility] [address], [place] (336) 350-1642, phone; (336) 294-5005, fax Sees patients 1st and 3rd Saturday of every month.  Must not qualify for public or private insurance (i.e. Medicaid, Medicare, [place] Health Choice,  Veterans' Benefits) • Household income should be no more than 200% of the poverty level •The clinic cannot treat you if you are pregnant or think you are pregnant • Sexually transmitted diseases are not treated at the clinic.  ° ° °Dental Care: °Organization         Address  Phone  Notes  °[facility] [address], [place] (336) 641-6152 Accepts children up to age 21 who are enrolled in Medicaid or [place] Health Choice; pregnant women with a Medicaid card; and children who have applied for Medicaid or [place] Health Choice, but were declined, whose parents can pay a reduced fee at time of service.  °[facility] [place]  [address], [place] (336) 641-7733 Accepts children up to age 21 who are enrolled in Medicaid or [place] Health Choice; pregnant women with a Medicaid card; and children who have applied for Medicaid or [place] Health Choice, but were declined, whose parents can pay a reduced fee at time of service.  °[facility] ° [address],  (336) 641-4533 Patients are seen by appointment only. Walk-ins are not accepted. [facility] will see patients 18 years of age and older. °Monday - Tuesday (8am-5pm) °Most Wednesdays (8:30-5pm) °$30 per visit, cash only  °[facility] ° [address], [place] (336) 641-4533 Patients are seen by appointment only. Walk-ins are not accepted. [facility] will see patients 18 years of age and older. °One Wednesday Evening (Monthly: Volunteer Based).  $30 per visit, cash only  °[facility]  (919) 537-3737 for adults; Children under age 4, call Graduate Pediatric Dentistry at ([phone]) [phone]. Children aged 4-14, please call (919) [phone] to request a pediatric application. ° Dental services are provided in all areas of dental care including fillings, crowns and bridges, complete and  partial dentures, implants, gum treatment, root canals, and extractions. Preventive care is also provided. Treatment is provided to both adults and children. °Patients are selected via a lottery and there is often a waiting list. °  °[facility] [address], ° ° (336) 763-8833 [email or web address] °  °[facility] [address], [place],  (336)723-1848, Ext. 123 Second and Fourth Thursday of each month, opens at 6:30 AM; Clinic ends at 9   AM.  Patients are seen on a first-come first-served basis, and a limited number are seen during each clinic.  ° °[facility] ° [address], [place], [place] (336) 723-7904   Eligibility Requirements °You must have lived in [place], [place], or [place] counties for at least the last three months. °  You cannot be eligible for state or federal sponsored healthcare insurance, including Veterans Administration, Medicaid, or Medicare. °  You generally cannot be eligible for healthcare insurance through your employer.  °  How to apply: °Eligibility screenings are held every Tuesday and Wednesday afternoon from 1:00 pm until 4:00 pm. You do not need an appointment for the interview!  °[facility] [address], [place], [place] 336-631-2330   °[facility]  336-342-8273   °[facility]  336-703-3100   °[facility]  336-570-6415   ° °Behavioral Health Resources in the Community: °Intensive Outpatient Programs °Organization         Address  Phone  Notes  °[facility] [address], [place], [place] 336-878-6098   °[facility] [address], [place], [place] 336-832-9800   °[facility] [address], [place], [place] ° 336-882-2125   °[facility] [address],  °[place], [place] 1-800-853-5163 or 336-641-4981   °Substance Abuse Resources °Organization          Address  Phone  Notes  °[facility]  336-882-2125   °[facility]  336-784-9470   °[facility]  336-285-9073   °[facility]  336-845-3988   °Residential & Outpatient Substance Abuse Program  1-800-659-3381   °Psychological Services °Organization         Address  Phone  Notes  °[facility]  336- 832-9600   °[facility]  336- 378-7881   °[facility] [address], [place] 1-800-853-5163 or 336-641-4981   ° °Mobile Crisis Teams °Organization         Address  Phone  Notes  °[facility], Mobile Crisis Care Unit  1-877-626-1772   °Assertive °[facility] ° [address] [place], [place] 336-834-9664   °Sharon DeEsch [address] °[place] 336-554-5454   ° °Self-Help/Support Groups °Organization         Address  Phone             Notes  °[facility] [place] - variety of support groups  336- 373-1402 Call for more information  °Narcotics Anonymous (NA), [facility] [address], °[place]  2 meetings at this location  ° °Residential Treatment Programs °Organization         Address  Phone  Notes  °[facility] [address],    °[place]  1-866-801-8205   °[facility] ° [address], [place], [place] 704-293-8524   °[facility] [address], [place] 336-845-3988 Admissions: 8am-3pm M-F  °[facility] [address]    °[place], [place] 336-841-1104   °[facility] [address], [place], [place] 336-379-7146   °[facility] [address]  °[place], [place] 336-285-9073   °[facility] - Intensive Outpatient [address], , [place] 336-852-3033   °[facility] [address]  °[place],  1-877-615-2722 or 336-784-9470   °[facility]   [address] [place], [place]  336-227-7417 Accepts Medicaid  °[facility] [address]  °[place] 1-800-659-3381 Substance Abuse/Addiction Treatment  ° °[place] Behavioral Health Resources °Organization         Address  Phone  Notes  °[facility]  (888) 581-9988   °Julie Brannon, PhD [address] [place], [place]   (336) 349-5553 or (336) 951-0000   °[facility]   [address] °[place], [place] (336) 349-4454   °[facility] [address], [place], [place] (336) 342-8316 Insurance/Medicaid/sponsorship through [facility]  °[facility] [address]                                    [place], [place] (336) 342-8316 Therapy/tele-psych/case  °[facility] [address]  ° [place], [place] (336) 349-2233    °Dr. Arfeen  (336) 349-4544   °[facility] [place]  [employer] [place] [facility] 1) [address], [place] °2) [address], [place] °3)  [address], [place] (336) 349-3220 °(336) 342-7768 ° °(336) 342-8140   °[place] [facility] (336) 342-1394 or (336) 342-3537 (After Hours)    ° ° ° ° °

## 2013-07-24 NOTE — ED Notes (Signed)
Pt. Reports right sided low back pain. Increased with movement. Denies pain with urination. Pt. States pain 10/10 however, patient laying in bed talking on phone during entire nursing assessment.

## 2013-07-24 NOTE — ED Notes (Signed)
Pt c/o right low back pain onset Thursday morning. Pt denies recent injury. Pt has not tried anything for pain today. Pt reports urinary frequency.

## 2013-07-25 NOTE — ED Provider Notes (Signed)
Medical screening examination/treatment/procedure(s) were performed by non-physician practitioner and as supervising physician I was immediately available for consultation/collaboration.  EKG Interpretation   None         Hoy Morn, MD 07/25/13 [phone]

## 2013-07-26 LAB — GC/CHLAMYDIA PROBE AMP
CT Probe RNA: NEGATIVE
GC PROBE AMP APTIMA: NEGATIVE

## 2013-12-19 DIAGNOSIS — Z8742 Personal history of other diseases of the female genital tract: Secondary | ICD-10-CM | POA: Insufficient documentation

## 2013-12-19 DIAGNOSIS — Z8719 Personal history of other diseases of the digestive system: Secondary | ICD-10-CM | POA: Insufficient documentation

## 2013-12-19 DIAGNOSIS — Z3202 Encounter for pregnancy test, result negative: Secondary | ICD-10-CM | POA: Insufficient documentation

## 2013-12-19 DIAGNOSIS — R51 Headache: Secondary | ICD-10-CM | POA: Insufficient documentation

## 2013-12-19 DIAGNOSIS — Z8659 Personal history of other mental and behavioral disorders: Secondary | ICD-10-CM | POA: Insufficient documentation

## 2013-12-19 DIAGNOSIS — R112 Nausea with vomiting, unspecified: Secondary | ICD-10-CM | POA: Insufficient documentation

## 2013-12-19 DIAGNOSIS — H53149 Visual discomfort, unspecified: Secondary | ICD-10-CM | POA: Insufficient documentation

## 2013-12-19 DIAGNOSIS — F17200 Nicotine dependence, unspecified, uncomplicated: Secondary | ICD-10-CM | POA: Insufficient documentation

## 2013-12-20 ENCOUNTER — Emergency Department (HOSPITAL_COMMUNITY)
Admission: EM | Admit: 2013-12-20 | Discharge: 2013-12-20 | Disposition: A | Discharge status: Home / Self Care | Payer: Medicaid Other | Attending: Emergency Medicine | Admitting: Emergency Medicine

## 2013-12-20 ENCOUNTER — Encounter (HOSPITAL_COMMUNITY): Payer: Self-pay | Admitting: Emergency Medicine

## 2013-12-20 DIAGNOSIS — R519 Headache, unspecified: Secondary | ICD-10-CM

## 2013-12-20 DIAGNOSIS — R51 Headache: Secondary | ICD-10-CM

## 2013-12-20 LAB — POC URINE PREG, ED: Preg Test, Ur: NEGATIVE

## 2013-12-20 MED ORDER — METOCLOPRAMIDE HCL 5 MG/ML IJ SOLN
10.0000 mg | Freq: Once | INTRAMUSCULAR | Status: AC
  Administered 2013-12-20: 10 mg via INTRAMUSCULAR
  Filled 2013-12-20: qty 2

## 2013-12-20 MED ORDER — DIPHENHYDRAMINE HCL 25 MG PO CAPS
50.0000 mg | ORAL_CAPSULE | Freq: Once | ORAL | Status: AC
  Administered 2013-12-20: 50 mg via ORAL
  Filled 2013-12-20: qty 2

## 2013-12-20 MED ORDER — METOCLOPRAMIDE HCL 10 MG PO TABS: 10.0000 mg | ORAL_TABLET | Freq: Four times a day (QID) | ORAL | Status: DC

## 2013-12-20 MED ORDER — IBUPROFEN 800 MG PO TABS
800.0000 mg | ORAL_TABLET | Freq: Once | ORAL | Status: AC
  Administered 2013-12-20: 800 mg via ORAL
  Filled 2013-12-20: qty 1

## 2013-12-20 NOTE — ED Notes (Signed)
Pt. reports headache with nausea/emesis onset this evening .

## 2013-12-20 NOTE — ED Provider Notes (Signed)
CSN: [id number]     Arrival date & time 12/19/13  2324 History   First MD Initiated Contact with Patient 12/20/13 0208     Chief Complaint  Patient presents with  . Headache     (Consider location/radiation/quality/duration/timing/severity/associated sxs/prior Treatment) HPI Comments: 38 year old female with history of depression, fibroids, tubal ligation presents with headache and nausea and vomiting since earlier today. Patient had similar episode couple days prior that resolved on its own. Patient gradually worsening headache today similar to previous headaches frontal origin, no head injury neck stiffness or fevers. Headache is gradually improved without treatment while waiting in ER. Frontal ache sensation. Mild photophobia. No vision loss. Patient is a smoker  Patient is a 38 y.o. female presenting with headaches. The history is provided by the patient.  Headache Associated symptoms: nausea, photophobia and vomiting   Associated symptoms: no abdominal pain, no back pain, no congestion, no fever, no neck pain, no neck stiffness and no numbness     Past Medical History  Diagnosis Date  . Depression   . Hernia, umbilical   . Fibroid    Past Surgical History  Procedure Laterality Date  . Tubal ligation     Family History  Problem Relation Age of Onset  . Mental retardation Father    History  Substance Use Topics  . Smoking status: Current Every Day Smoker  . Smokeless tobacco: Not on file  . Alcohol Use: Yes   OB History   Grav Para Term Preterm Abortions TAB SAB Ect Mult Living   5 4 4  1  1   4      Review of Systems  Constitutional: Negative for fever and chills.  HENT: Negative for congestion.   Eyes: Positive for photophobia. Negative for visual disturbance.  Respiratory: Negative for shortness of breath.   Cardiovascular: Negative for chest pain.  Gastrointestinal: Positive for nausea and vomiting. Negative for abdominal pain.  Genitourinary: Negative for  dysuria and flank pain.  Musculoskeletal: Negative for back pain, gait problem, neck pain and neck stiffness.  Skin: Negative for rash.  Neurological: Positive for headaches. Negative for weakness, light-headedness and numbness.      Allergies  Review of patient's allergies indicates no known allergies.  Home Medications   Prior to Admission medications   Medication Sig Start Date End Date Taking? Authorizing Provider  aspirin-acetaminophen-caffeine (EXCEDRIN MIGRAINE) [phone] MG per tablet Take 2 tablets by mouth every 6 (six) hours as needed for headache.   Yes Historical Provider, MD   BP 102/75  Pulse 61  Temp(Src) 98 F (36.7 C) (Oral)  Resp 17  Ht 5\' 6"  (1.676 m)  Wt 135 lb (61.236 kg)  BMI 21.80 kg/m2  SpO2 96% Physical Exam  Nursing note and vitals reviewed. Constitutional: She is oriented to person, place, and time. She appears well-developed and well-nourished.  HENT:  Head: Normocephalic and atraumatic.  Eyes: Conjunctivae are normal. Right eye exhibits no discharge. Left eye exhibits no discharge.  Neck: Normal range of motion. Neck supple. No tracheal deviation present.  Cardiovascular: Normal rate and regular rhythm.   Pulmonary/Chest: Effort normal and breath sounds normal.  Abdominal: Soft.  Musculoskeletal: She exhibits no edema.  Neurological: She is alert and oriented to person, place, and time. GCS eye subscore is 4. GCS verbal subscore is 5. GCS motor subscore is 6.  5+ strength in UE and LE with f/e at major joints. Sensation to palpation intact in UE and LE. CNs 2-12 grossly intact.  EOMFI.  PERRL.   Finger nose and coordination intact bilateral.   Visual fields intact to finger testing.   Skin: Skin is warm. No rash noted.  Psychiatric: She has a normal mood and affect.    ED Course  Procedures (including critical care time) Labs Review Labs Reviewed  POC URINE PREG, ED    Imaging Review No results found.   EKG  Interpretation None      MDM   Final diagnoses:  None   Patient with headache similar previous and gradual onset. Normal neuro exam no signs of meningitis. Migraine cocktail ordered. Patient had tubal ligation in the past we'll give ibuprofen as well. Patient's headache mild in ER and discuss followup outpatient and if no improvement to see neurology Gen.  Results and differential diagnosis were discussed with the patient/parent/guardian. Close follow up outpatient was discussed, comfortable with the plan.   Medications  metoCLOPramide (REGLAN) injection 10 mg (not administered)  diphenhydrAMINE (BENADRYL) capsule 50 mg (not administered)    Filed Vitals:   12/20/13 0016 12/20/13 0328  BP: 109/85 102/75  Pulse: 63 61  Temp: 98 F (36.7 C)   TempSrc: Oral   Resp:  17  Height: 5\' 6"  (1.676 m)   Weight: 135 lb (61.236 kg)   SpO2: 100% 96%   Diagnosis: Headache, nausea   Mariea Clonts, MD 12/20/13 [phone]

## 2013-12-20 NOTE — ED Notes (Signed)
Pt states she took a [employer] last night around 10:30 pm.  Pt states the pain is 6/10 with a hx of migraines.  Pt states she has had the headache since yesterday.  Pupils are round and reactive.

## 2013-12-20 NOTE — Discharge Instructions (Signed)
Take Reglan with Benadryl for headaches nausea and vomiting. Return if headache is sudden onset, different than previous, fevers or chills or other concerns.  If you were given medicines take as directed.  If you are on coumadin or contraceptives realize their levels and effectiveness is altered by many different medicines.  If you have any reaction (rash, tongues swelling, other) to the medicines stop taking and see a physician.   Please follow up as directed and return to the ER or see a physician for new or worsening symptoms.  Thank you. Filed Vitals:   12/20/13 0016 12/20/13 0328  BP: 109/85 102/75  Pulse: 63 61  Temp: 98 F (36.7 C)   TempSrc: Oral   Resp:  17  Height: 5\' 6"  (1.676 m)   Weight: 135 lb (61.236 kg)   SpO2: 100% 96%

## 2013-12-20 NOTE — ED Notes (Signed)
Pt taken to the waiting room in a wheelchair.  Pt given a bus pass at discharge.  Pt was alert and oriented and able to ambulate to the sidewalk with no difficulties.

## 2014-04-25 ENCOUNTER — Encounter (HOSPITAL_COMMUNITY): Payer: Self-pay | Admitting: Emergency Medicine

## 2014-06-26 ENCOUNTER — Emergency Department (HOSPITAL_COMMUNITY)
Admission: EM | Admit: 2014-06-26 | Discharge: 2014-06-26 | Discharge status: Against Medical Advice | Payer: Medicaid Other | Attending: Emergency Medicine | Admitting: Emergency Medicine

## 2014-06-26 ENCOUNTER — Encounter (HOSPITAL_COMMUNITY): Payer: Self-pay | Admitting: Emergency Medicine

## 2014-06-26 DIAGNOSIS — R109 Unspecified abdominal pain: Secondary | ICD-10-CM | POA: Insufficient documentation

## 2014-06-26 DIAGNOSIS — R1110 Vomiting, unspecified: Secondary | ICD-10-CM | POA: Insufficient documentation

## 2014-06-26 DIAGNOSIS — Z720 Tobacco use: Secondary | ICD-10-CM | POA: Insufficient documentation

## 2014-06-26 NOTE — ED Notes (Signed)
Pt states she is leaving due to the fact she was going to have to return to waiting room. IV removed.

## 2014-06-26 NOTE — ED Notes (Signed)
Pt not in [place].

## 2014-06-26 NOTE — ED Notes (Addendum)
Pt from [place] via [facility] c/o vomiting and abdominal pain starting today. Etoh last night. 4 mg Zofran en route. 18 g left forearm. 250 ml NS en route.

## 2014-06-26 NOTE — ED Notes (Signed)
Patient refused blood draw for labs. Patient stated she did not want to go to lobby. RN Morey Hummingbird made aware

## 2014-06-27 ENCOUNTER — Encounter (HOSPITAL_COMMUNITY): Payer: Self-pay | Admitting: Emergency Medicine

## 2014-06-27 ENCOUNTER — Emergency Department (INDEPENDENT_AMBULATORY_CARE_PROVIDER_SITE_OTHER)
Admission: EM | Admit: 2014-06-27 | Discharge: 2014-06-27 | Disposition: A | Discharge status: Home / Self Care | Payer: Self-pay | Source: Home / Self Care | Attending: Family Medicine | Admitting: Family Medicine

## 2014-06-27 DIAGNOSIS — K529 Noninfective gastroenteritis and colitis, unspecified: Secondary | ICD-10-CM

## 2014-06-27 DIAGNOSIS — R112 Nausea with vomiting, unspecified: Secondary | ICD-10-CM

## 2014-06-27 DIAGNOSIS — R110 Nausea: Secondary | ICD-10-CM

## 2014-06-27 LAB — POCT URINALYSIS DIP (DEVICE)
GLUCOSE, UA: 100 mg/dL — AB
KETONES UR: 15 mg/dL — AB
NITRITE: NEGATIVE
Protein, ur: 30 mg/dL — AB
Specific Gravity, Urine: >=1.03 (ref 1.005–1.030)
Urobilinogen, UA: 0.2 mg/dL (ref 0.0–1.0)
pH: 5 (ref 5.0–8.0)

## 2014-06-27 LAB — POCT PREGNANCY, URINE: Preg Test, Ur: NEGATIVE

## 2014-06-27 MED ORDER — ONDANSETRON HCL 4 MG PO TABS: 4.0000 mg | ORAL_TABLET | Freq: Four times a day (QID) | ORAL | Status: DC

## 2014-06-27 NOTE — Discharge Instructions (Signed)
Gastritis, Adult °Gastritis is soreness and swelling (inflammation) of the lining of the stomach. Gastritis can develop as a sudden onset (acute) or long-term (chronic) condition. If gastritis is not treated, it can lead to stomach bleeding and ulcers. °CAUSES  °Gastritis occurs when the stomach lining is weak or damaged. Digestive juices from the stomach then inflame the weakened stomach lining. The stomach lining may be weak or damaged due to viral or bacterial infections. One common bacterial infection is the Helicobacter pylori infection. Gastritis can also result from excessive alcohol consumption, taking certain medicines, or having too much acid in the stomach.  °SYMPTOMS  °In some cases, there are no symptoms. When symptoms are present, they may include: °· Pain or a burning sensation in the upper abdomen. °· Nausea. °· Vomiting. °· An uncomfortable feeling of fullness after eating. °DIAGNOSIS  °Your caregiver may suspect you have gastritis based on your symptoms and a physical exam. To determine the cause of your gastritis, your caregiver may perform the following: °· Blood or stool tests to check for the H pylori bacterium. °· Gastroscopy. A thin, flexible tube (endoscope) is passed down the esophagus and into the stomach. The endoscope has a light and camera on the end. Your caregiver uses the endoscope to view the inside of the stomach. °· Taking a tissue sample (biopsy) from the stomach to examine under a microscope. °TREATMENT  °Depending on the cause of your gastritis, medicines may be prescribed. If you have a bacterial infection, such as an H pylori infection, antibiotics may be given. If your gastritis is caused by too much acid in the stomach, H2 blockers or antacids may be given. Your caregiver may recommend that you stop taking aspirin, ibuprofen, or other nonsteroidal anti-inflammatory drugs (NSAIDs). °HOME CARE INSTRUCTIONS °· Only take over-the-counter or prescription medicines as directed by  your caregiver. °· If you were given antibiotic medicines, take them as directed. Finish them even if you start to feel better. °· Drink enough fluids to keep your urine clear or pale yellow. °· Avoid foods and drinks that make your symptoms worse, such as: °¨ Caffeine or alcoholic drinks. °¨ Chocolate. °¨ Peppermint or mint flavorings. °¨ Garlic and onions. °¨ Spicy foods. °¨ Citrus fruits, such as oranges, lemons, or limes. °¨ Tomato-based foods such as sauce, chili, salsa, and pizza. °¨ Fried and fatty foods. °· Eat small, frequent meals instead of large meals. °SEEK IMMEDIATE MEDICAL CARE IF:  °· You have black or dark red stools. °· You vomit blood or material that looks like coffee grounds. °· You are unable to keep fluids down. °· Your abdominal pain gets worse. °· You have a fever. °· You do not feel better after 1 week. °· You have any other questions or concerns. °MAKE SURE YOU: °· Understand these instructions. °· Will watch your condition. °· Will get help right away if you are not doing well or get worse. °Document Released: 06/04/2001 Document Revised: 12/10/2011 Document Reviewed: 07/24/2011 °ExitCare® Patient Information ©2015 [employer]. This information is not intended to replace advice given to you by your health care provider. Make sure you discuss any questions you have with your health care provider. ° °Nausea and Vomiting °Nausea is a sick feeling that often comes before throwing up (vomiting). Vomiting is a reflex where stomach contents come out of your mouth. Vomiting can cause severe loss of body fluids (dehydration). Children and elderly adults can become dehydrated quickly, especially if they also have diarrhea. Nausea and vomiting are   symptoms of a condition or disease. It is important to find the cause of your symptoms. °CAUSES  °· Direct irritation of the stomach lining. This irritation can result from increased acid production (gastroesophageal reflux disease), infection, food  poisoning, taking certain medicines (such as nonsteroidal anti-inflammatory drugs), alcohol use, or tobacco use. °· Signals from the brain. These signals could be caused by a headache, heat exposure, an inner ear disturbance, increased pressure in the brain from injury, infection, a tumor, or a concussion, pain, emotional stimulus, or metabolic problems. °· An obstruction in the gastrointestinal tract (bowel obstruction). °· Illnesses such as diabetes, hepatitis, gallbladder problems, appendicitis, kidney problems, cancer, sepsis, atypical symptoms of a heart attack, or eating disorders. °· Medical treatments such as chemotherapy and radiation. °· Receiving medicine that makes you sleep (general anesthetic) during surgery. °DIAGNOSIS °Your caregiver may ask for tests to be done if the problems do not improve after a few days. Tests may also be done if symptoms are severe or if the reason for the nausea and vomiting is not clear. Tests may include: °· Urine tests. °· Blood tests. °· Stool tests. °· Cultures (to look for evidence of infection). °· X-rays or other imaging studies. °Test results can help your caregiver make decisions about treatment or the need for additional tests. °TREATMENT °You need to stay well hydrated. Drink frequently but in small amounts. You may wish to drink water, sports drinks, clear broth, or eat frozen ice pops or gelatin dessert to help stay hydrated. When you eat, eating slowly may help prevent nausea. There are also some antinausea medicines that may help prevent nausea. °HOME CARE INSTRUCTIONS  °· Take all medicine as directed by your caregiver. °· If you do not have an appetite, do not force yourself to eat. However, you must continue to drink fluids. °· If you have an appetite, eat a normal diet unless your caregiver tells you differently. °¨ Eat a variety of complex carbohydrates (rice, wheat, potatoes, bread), lean meats, yogurt, fruits, and vegetables. °¨ Avoid high-fat foods  because they are more difficult to digest. °· Drink enough water and fluids to keep your urine clear or pale yellow. °· If you are dehydrated, ask your caregiver for specific rehydration instructions. Signs of dehydration may include: °¨ Severe thirst. °¨ Dry lips and mouth. °¨ Dizziness. °¨ Dark urine. °¨ Decreasing urine frequency and amount. °¨ Confusion. °¨ Rapid breathing or pulse. °SEEK IMMEDIATE MEDICAL CARE IF:  °· You have blood or brown flecks (like coffee grounds) in your vomit. °· You have black or bloody stools. °· You have a severe headache or stiff neck. °· You are confused. °· You have severe abdominal pain. °· You have chest pain or trouble breathing. °· You do not urinate at least once every 8 hours. °· You develop cold or clammy skin. °· You continue to vomit for longer than 24 to 48 hours. °· You have a fever. °MAKE SURE YOU:  °· Understand these instructions. °· Will watch your condition. °· Will get help right away if you are not doing well or get worse. °Document Released: 06/10/2005 Document Revised: 09/02/2011 Document Reviewed: 11/07/2010 °ExitCare® Patient Information ©2015 [employer]. This information is not intended to replace advice given to you by your health care provider. Make sure you discuss any questions you have with your health care provider. ° °

## 2014-06-27 NOTE — ED Provider Notes (Signed)
CSN: [id number]     Arrival date & time 06/27/14  9381 History   First MD Initiated Contact with Patient 06/27/14 1021     Chief Complaint  Patient presents with  . Emesis  . Abdominal Pain   (Consider location/radiation/quality/duration/timing/severity/associated sxs/prior Treatment) HPI Comments: 39 year old female presents with a complaint of vomiting yesterday. She says she vomited approximate 30 times. It started mid afternoon while she was at [facility]. This was associated with mid abdominal pain. She denies diarrhea or fever. She had what she calls a normal stool although darker than usual yesterday. The night before she endorses drinking at least 2 glasses of liquor. The following morning she had a hangover. She states her abdominal pain associated with the vomiting was so severe yesterday evening and that she called an ambulance so she could be seen in the emergency department. They started an IV gave her some Zofran IV and once she entered the emergency department she was triaged and then sit back to the lobby. She waited for a particular amount of time and decided to elope because she was having too much pain in the emergency department. Today her pain is better and she is having no vomiting.   Past Medical History  Diagnosis Date  . Depression   . Hernia, umbilical   . Fibroid    Past Surgical History  Procedure Laterality Date  . Tubal ligation     Family History  Problem Relation Age of Onset  . Mental retardation Father    History  Substance Use Topics  . Smoking status: Current Every Day Smoker -- 1.00 packs/day    Types: Cigarettes  . Smokeless tobacco: Never Used  . Alcohol Use: Yes     Comment: social   OB History    Gravida Para Term Preterm AB TAB SAB Ectopic Multiple Living   5 4 4  1  1   4      Review of Systems  Constitutional: Positive for activity change and appetite change. Negative for fever.  HENT: Negative.   Respiratory: Negative.    Cardiovascular: Negative for chest pain.  Gastrointestinal: Positive for nausea, vomiting and abdominal pain. Negative for diarrhea, constipation and blood in stool.  Genitourinary: Negative for dysuria, urgency, frequency, flank pain, vaginal discharge, difficulty urinating and pelvic pain.  Neurological: Negative.     Allergies  Review of patient's allergies indicates no known allergies.  Home Medications   Prior to Admission medications   Medication Sig Start Date End Date Taking? Authorizing Provider  aspirin-acetaminophen-caffeine (EXCEDRIN MIGRAINE) [phone] MG per tablet Take 2 tablets by mouth every 6 (six) hours as needed for headache.    Historical Provider, MD  ondansetron (ZOFRAN) 4 MG tablet Take 1 tablet (4 mg total) by mouth every 6 (six) hours. 06/27/14   Janne Napoleon, NP   BP 136/87 mmHg  Pulse 66  Temp(Src) 99.1 F (37.3 C) (Oral)  Resp 16  SpO2 99%  LMP 06/25/2014 Physical Exam  Constitutional: She is oriented to person, place, and time. She appears well-developed and well-nourished. No distress.  Eyes: EOM are normal.  Neck: Normal range of motion. Neck supple.  Cardiovascular: Normal rate, regular rhythm and normal heart sounds.   Pulmonary/Chest: Effort normal and breath sounds normal. No respiratory distress. She has no wheezes. She has no rales.  Abdominal: Soft. Bowel sounds are normal. She exhibits no distension and no mass. There is no rebound and no guarding.  Minor periumbilical tenderness  Musculoskeletal: She exhibits no edema.  Neurological: She is alert and oriented to person, place, and time. She exhibits normal muscle tone.  Skin: Skin is warm and dry.  Psychiatric: She has a normal mood and affect.  Nursing note and vitals reviewed.   ED Course  Procedures (including critical care time) Labs Review Labs Reviewed  POCT URINALYSIS DIP (DEVICE) - Abnormal; Notable for the following:    Glucose, UA 100 (*)    Bilirubin Urine MODERATE (*)     Ketones, ur 15 (*)    Hgb urine dipstick MODERATE (*)    Protein, ur 30 (*)    Leukocytes, UA TRACE (*)    All other components within normal limits  POCT PREGNANCY, URINE    Imaging Review No results found.   MDM   1. Gastroenteritis   2. Nausea and vomiting in adult patient    Likely alcohol gastritis vs viral gastritis No more vomiting today. Abdomen exam benign.  Clear liquids, bland foods in smal amts, slowly advance diet. No fast foods. No work x 2 d, works in [employer].      Janne Napoleon, NP 06/27/14 [place], NP 06/27/14 1102

## 2014-06-27 NOTE — ED Notes (Signed)
Pt reports nausea and vomiting and stomach pains in [employer] yesterday.  She was taken to the ED, but she stated she was in too much pain to wait there.  The last time the pt vomited was about 2030 last night.  Pt still complains of intermittent abdominal pain, especially when she moves around.  Pt states she had a strange stool this morning, she has a picture of.  She states it looks like "a baby's stool".

## 2014-12-23 ENCOUNTER — Inpatient Hospital Stay (HOSPITAL_COMMUNITY)
Admission: AD | Admit: 2014-12-23 | Discharge: 2014-12-23 | Disposition: A | Discharge status: Home / Self Care | Payer: Self-pay | Source: Ambulatory Visit | Attending: Family Medicine | Admitting: Family Medicine

## 2014-12-23 ENCOUNTER — Encounter (HOSPITAL_COMMUNITY): Payer: Self-pay | Admitting: *Deleted

## 2014-12-23 DIAGNOSIS — Z3202 Encounter for pregnancy test, result negative: Secondary | ICD-10-CM | POA: Insufficient documentation

## 2014-12-23 DIAGNOSIS — F17210 Nicotine dependence, cigarettes, uncomplicated: Secondary | ICD-10-CM | POA: Insufficient documentation

## 2014-12-23 LAB — URINALYSIS, ROUTINE W REFLEX MICROSCOPIC
BILIRUBIN URINE: NEGATIVE
Glucose, UA: NEGATIVE mg/dL
Hgb urine dipstick: NEGATIVE
Ketones, ur: NEGATIVE mg/dL
LEUKOCYTES UA: NEGATIVE
Nitrite: NEGATIVE
PROTEIN: NEGATIVE mg/dL
SPECIFIC GRAVITY, URINE: 1.015 (ref 1.005–1.030)
Urobilinogen, UA: 1 mg/dL (ref 0.0–1.0)
pH: 6 (ref 5.0–8.0)

## 2014-12-23 LAB — HCG, QUANTITATIVE, PREGNANCY: hCG, Beta Chain, Quant, S: <1 m[IU]/mL (ref <5)

## 2014-12-23 NOTE — Discharge Instructions (Signed)
Back Pain, Adult Low back pain is very common. About 1 in 5 people have back pain.The cause of low back pain is rarely dangerous. The pain often gets better over time.About half of people with a sudden onset of back pain feel better in just 2 weeks. About 8 in 10 people feel better by 6 weeks.  CAUSES Some common causes of back pain include:  Strain of the muscles or ligaments supporting the spine.  Wear and tear (degeneration) of the spinal discs.  Arthritis.  Direct injury to the back. DIAGNOSIS Most of the time, the direct cause of low back pain is not known.However, back pain can be treated effectively even when the exact cause of the pain is unknown.Answering your caregiver's questions about your overall health and symptoms is one of the most accurate ways to make sure the cause of your pain is not dangerous. If your caregiver needs more information, he or she may order lab work or imaging tests (X-rays or MRIs).However, even if imaging tests show changes in your back, this usually does not require surgery. HOME CARE INSTRUCTIONS For many people, back pain returns.Since low back pain is rarely dangerous, it is often a condition that people can learn to manageon their own.   Remain active. It is stressful on the back to sit or stand in one place. Do not sit, drive, or stand in one place for more than 30 minutes at a time. Take short walks on level surfaces as soon as pain allows.Try to increase the length of time you walk each day.  Do not stay in bed.Resting more than 1 or 2 days can delay your recovery.  Do not avoid exercise or work.Your body is made to move.It is not dangerous to be active, even though your back may hurt.Your back will likely heal faster if you return to being active before your pain is gone.  Pay attention to your body when you bend and lift. Many people have less discomfortwhen lifting if they bend their knees, keep the load close to their bodies,and  avoid twisting. Often, the most comfortable positions are those that put less stress on your recovering back.  Find a comfortable position to sleep. Use a firm mattress and lie on your side with your knees slightly bent. If you lie on your back, put a pillow under your knees.  Only take over-the-counter or prescription medicines as directed by your caregiver. Over-the-counter medicines to reduce pain and inflammation are often the most helpful.Your caregiver may prescribe muscle relaxant drugs.These medicines help dull your pain so you can more quickly return to your normal activities and healthy exercise.  Put ice on the injured area.  Put ice in a plastic bag.  Place a towel between your skin and the bag.  Leave the ice on for 15-20 minutes, 03-04 times a day for the first 2 to 3 days. After that, ice and heat may be alternated to reduce pain and spasms.  Ask your caregiver about trying back exercises and gentle massage. This may be of some benefit.  Avoid feeling anxious or stressed.Stress increases muscle tension and can worsen back pain.It is important to recognize when you are anxious or stressed and learn ways to manage it.Exercise is a great option. SEEK MEDICAL CARE IF:  You have pain that is not relieved with rest or medicine.  You have pain that does not improve in 1 week.  You have new symptoms.  You are generally not feeling well. SEEK   IMMEDIATE MEDICAL CARE IF:   You have pain that radiates from your back into your legs.  You develop new bowel or bladder control problems.  You have unusual weakness or numbness in your arms or legs.  You develop nausea or vomiting.  You develop abdominal pain.  You feel faint. Document Released: 06/10/2005 Document Revised: 12/10/2011 Document Reviewed: 10/12/2013 ExitCare Patient Information 2015 [employer]. This information is not intended to replace advice given to you by your health care provider. Make sure you  discuss any questions you have with your health care provider.  

## 2014-12-23 NOTE — MAU Provider Note (Signed)
  History     CSN: [id number]  Arrival date and time: 12/23/14 1049   First Provider Initiated Contact with Patient 12/23/14 1244      Chief Complaint  Patient presents with  . Possible Pregnancy  . Abdominal Pain   HPI Renee Herrera 39 y.o. [id number] presents for pregnancy test.  She had a BTL 11 years ago.  She missed her period in June of this year.  In May, she took a home pregnancy test and got a positive result.  She is concerned about why she missed her period.  She denies abdominal pain, vaginal bleeding, vaginal discharge, dysuria, fever, weakness, CP, SOB, headache.   She declines STD testing OB History    Gravida Para Term Preterm AB TAB SAB Ectopic Multiple Living   5 4 4  1  1   4       Past Medical History  Diagnosis Date  . Depression   . Hernia, umbilical   . Fibroid     Past Surgical History  Procedure Laterality Date  . Tubal ligation      Family History  Problem Relation Age of Onset  . Mental retardation Father     History  Substance Use Topics  . Smoking status: Current Every Day Smoker -- 1.00 packs/day    Types: Cigarettes  . Smokeless tobacco: Never Used  . Alcohol Use: Yes     Comment: social    Allergies: No Known Allergies  Prescriptions prior to admission  Medication Sig Dispense Refill Last Dose  . Aspirin-Acetaminophen-Caffeine (GOODY HEADACHE PO) Take 1 packet by mouth daily as needed (headache).   Past Month at Unknown time    ROS  Pertinent ROS in HPI.  All other systems are negative.    Physical Exam   Blood pressure 98/71, pulse 78, temperature 98.5 F (36.9 C), temperature source Oral, resp. rate 18, height 5' 3.25" (1.607 m), weight 143 lb (64.864 kg), last menstrual period 11/06/2014.  Physical Exam  Constitutional: She is oriented to person, place, and time. She appears well-developed and well-nourished. No distress.  HENT:  Head: Normocephalic and atraumatic.  Eyes: EOM are normal.  Neck: Normal range of motion.   Cardiovascular: Normal rate.   Respiratory: Effort normal. No respiratory distress.  Musculoskeletal: Normal range of motion.  Neurological: She is alert and oriented to person, place, and time.  Skin: Skin is warm and dry.  Psychiatric: She has a normal mood and affect.    MAU Course  Procedures  MDM Quant HCG ordered to establish pregnancy status.  HCG <1.  Pt informed and notes she does not need anything further.    Assessment and Plan  A:  1. Encounter for pregnancy test with result negative    P: Discharge to home F/u with PCP to discuss possibility of perimenopause Patient may return to MAU as needed or if her condition were to change or worsen or to [facility] ED if not GYN related.   Paticia Stack 12/23/2014, 1:28 PM

## 2014-12-23 NOTE — MAU Note (Signed)
No period in June.  +HPT, doesn't want to believe it. Had tubal 11 yrs ago. Feeling cramping.

## 2015-05-07 ENCOUNTER — Encounter (HOSPITAL_COMMUNITY): Payer: Self-pay | Admitting: Emergency Medicine

## 2015-05-07 ENCOUNTER — Emergency Department (HOSPITAL_COMMUNITY)
Admission: EM | Admit: 2015-05-07 | Discharge: 2015-05-07 | Disposition: A | Discharge status: Home / Self Care | Payer: Self-pay | Attending: Emergency Medicine | Admitting: Emergency Medicine

## 2015-05-07 DIAGNOSIS — Z8659 Personal history of other mental and behavioral disorders: Secondary | ICD-10-CM | POA: Insufficient documentation

## 2015-05-07 DIAGNOSIS — Z8719 Personal history of other diseases of the digestive system: Secondary | ICD-10-CM | POA: Insufficient documentation

## 2015-05-07 DIAGNOSIS — F17210 Nicotine dependence, cigarettes, uncomplicated: Secondary | ICD-10-CM | POA: Insufficient documentation

## 2015-05-07 DIAGNOSIS — Z86018 Personal history of other benign neoplasm: Secondary | ICD-10-CM | POA: Insufficient documentation

## 2015-05-07 DIAGNOSIS — M25561 Pain in right knee: Secondary | ICD-10-CM | POA: Insufficient documentation

## 2015-05-07 MED ORDER — NAPROXEN 375 MG PO TABS: 375.0000 mg | ORAL_TABLET | Freq: Two times a day (BID) | ORAL | Status: DC

## 2015-05-07 NOTE — ED Notes (Signed)
Bed: [id number] Expected date:  Expected time:  Means of arrival:  Comments: 39 y/o F chronic knee pain
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
## 2015-05-07 NOTE — Discharge Instructions (Signed)
Knee Pain Knee pain is a very common symptom and can have many causes. Knee pain often goes away when you follow your health care provider's instructions for relieving pain and discomfort at home. However, knee pain can develop into a condition that needs treatment. Some conditions may include:  Arthritis caused by wear and tear (osteoarthritis).  Arthritis caused by swelling and irritation (rheumatoid arthritis or gout).  A cyst or growth in your knee.  An infection in your knee joint.  An injury that will not heal.  Damage, swelling, or irritation of the tissues that support your knee (torn ligaments or tendinitis). If your knee pain continues, additional tests may be ordered to diagnose your condition. Tests may include X-rays or other imaging studies of your knee. You may also need to have fluid removed from your knee. Treatment for ongoing knee pain depends on the cause, but treatment may include:  Medicines to relieve pain or swelling.  Steroid injections in your knee.  Physical therapy.  Surgery. HOME CARE INSTRUCTIONS  Take medicines only as directed by your health care provider.  Rest your knee and keep it raised (elevated) while you are resting.  Do not do things that cause or worsen pain.  Avoid high-impact activities or exercises, such as running, jumping rope, or doing jumping jacks.  Apply ice to the knee area:  Put ice in a plastic bag.  Place a towel between your skin and the bag.  Leave the ice on for 20 minutes, 2-3 times a day.  Ask your health care provider if you should wear an elastic knee support.  Keep a pillow under your knee when you sleep.  Lose weight if you are overweight. Extra weight can put pressure on your knee.  Do not use any tobacco products, including cigarettes, chewing tobacco, or electronic cigarettes. If you need help quitting, ask your health care provider. Smoking may slow the healing of any bone and joint problems that you may  have. SEEK MEDICAL CARE IF:  Your knee pain continues, changes, or gets worse.  You have a fever along with knee pain.  Your knee buckles or locks up.  Your knee becomes more swollen. SEEK IMMEDIATE MEDICAL CARE IF:   Your knee joint feels hot to the touch.  You have chest pain or trouble breathing.   This information is not intended to replace advice given to you by your health care provider. Make sure you discuss any questions you have with your health care provider.   Document Released: 04/07/2007 Document Revised: 07/01/2014 Document Reviewed: 01/24/2014 Elsevier Interactive Patient Education 2016 [place] is a term that is commonly used to refer to joint pain or joint disease. There are more than 100 types of arthritis. CAUSES The most common cause of this condition is wear and tear of a joint. Other causes include:  Gout.  Inflammation of a joint.  An infection of a joint.  Sprains and other injuries near the joint.  A drug reaction or allergic reaction. In some cases, the cause may not be known. SYMPTOMS The main symptom of this condition is pain in the joint with movement. Other symptoms include:  Redness, swelling, or stiffness at a joint.  Warmth coming from the joint.  Fever.  Overall feeling of illness. DIAGNOSIS This condition may be diagnosed with a physical exam and tests, including:  Blood tests.  Urine tests.  Imaging tests, such as MRI, X-rays, or a CT scan. Sometimes, fluid is removed from  a joint for testing. TREATMENT Treatment for this condition may involve:  Treatment of the cause, if it is known.  Rest.  Raising (elevating) the joint.  Applying cold or hot packs to the joint.  Medicines to improve symptoms and reduce inflammation.  Injections of a steroid such as cortisone into the joint to help reduce pain and inflammation. Depending on the cause of your arthritis, you may need to make lifestyle  changes to reduce stress on your joint. These changes may include exercising more and losing weight. HOME CARE INSTRUCTIONS Medicines  Take over-the-counter and prescription medicines only as told by your health care provider.  Do not take aspirin to relieve pain if gout is suspected. Activities  Rest your joint if told by your health care provider. Rest is important when your disease is active and your joint feels painful, swollen, or stiff.  Avoid activities that make the pain worse. It is important to balance activity with rest.  Exercise your joint regularly with range-of-motion exercises as told by your health care provider. Try doing low-impact exercise, such as:  Swimming.  Water aerobics.  Biking.  Walking. Joint Care  If your joint is swollen, keep it elevated if told by your health care provider.  If your joint feels stiff in the morning, try taking a warm shower.  If directed, apply heat to the joint. If you have diabetes, do not apply heat without permission from your health care provider.  Put a towel between the joint and the hot pack or heating pad.  Leave the heat on the area for 20-30 minutes.  If directed, apply ice to the joint:  Put ice in a plastic bag.  Place a towel between your skin and the bag.  Leave the ice on for 20 minutes, 2-3 times per day.  Keep all follow-up visits as told by your health care provider. This is important. SEEK MEDICAL CARE IF:  The pain gets worse.  You have a fever. SEEK IMMEDIATE MEDICAL CARE IF:  You develop severe joint pain, swelling, or redness.  Many joints become painful and swollen.  You develop severe back pain.  You develop severe weakness in your leg.  You cannot control your bladder or bowels.   This information is not intended to replace advice given to you by your health care provider. Make sure you discuss any questions you have with your health care provider.   Document Released: 07/18/2004  Document Revised: 03/01/2015 Document Reviewed: 09/05/2014 Elsevier Interactive Patient Education [employer].
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
## 2015-09-18 ENCOUNTER — Encounter (HOSPITAL_COMMUNITY): Payer: Self-pay | Admitting: *Deleted

## 2015-09-18 ENCOUNTER — Emergency Department (HOSPITAL_COMMUNITY)
Admission: EM | Admit: 2015-09-18 | Discharge: 2015-09-19 | Disposition: A | Discharge status: Home / Self Care | Payer: No Typology Code available for payment source | Attending: Emergency Medicine | Admitting: Emergency Medicine

## 2015-09-18 DIAGNOSIS — F17210 Nicotine dependence, cigarettes, uncomplicated: Secondary | ICD-10-CM | POA: Insufficient documentation

## 2015-09-18 DIAGNOSIS — Y998 Other external cause status: Secondary | ICD-10-CM | POA: Insufficient documentation

## 2015-09-18 DIAGNOSIS — Y92410 Unspecified street and highway as the place of occurrence of the external cause: Secondary | ICD-10-CM | POA: Insufficient documentation

## 2015-09-18 DIAGNOSIS — Z8659 Personal history of other mental and behavioral disorders: Secondary | ICD-10-CM | POA: Diagnosis not present

## 2015-09-18 DIAGNOSIS — S4992XA Unspecified injury of left shoulder and upper arm, initial encounter: Secondary | ICD-10-CM | POA: Insufficient documentation

## 2015-09-18 DIAGNOSIS — Y9389 Activity, other specified: Secondary | ICD-10-CM | POA: Insufficient documentation

## 2015-09-18 DIAGNOSIS — M25512 Pain in left shoulder: Secondary | ICD-10-CM

## 2015-09-18 DIAGNOSIS — Z86018 Personal history of other benign neoplasm: Secondary | ICD-10-CM | POA: Insufficient documentation

## 2015-09-18 DIAGNOSIS — R55 Syncope and collapse: Secondary | ICD-10-CM | POA: Diagnosis not present

## 2015-09-18 DIAGNOSIS — Z791 Long term (current) use of non-steroidal anti-inflammatories (NSAID): Secondary | ICD-10-CM | POA: Insufficient documentation

## 2015-09-18 DIAGNOSIS — Z8719 Personal history of other diseases of the digestive system: Secondary | ICD-10-CM | POA: Diagnosis not present

## 2015-09-18 LAB — POC URINE PREG, ED: Preg Test, Ur: NEGATIVE
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
## 2015-09-19 ENCOUNTER — Emergency Department (HOSPITAL_COMMUNITY): Payer: No Typology Code available for payment source

## 2015-09-19 MED ORDER — ACETAMINOPHEN 500 MG PO TABS
1000.0000 mg | ORAL_TABLET | Freq: Once | ORAL | Status: AC
  Administered 2015-09-19: 1000 mg via ORAL
  Filled 2015-09-19: qty 2

## 2015-09-19 MED ORDER — IBUPROFEN 800 MG PO TABS
800.0000 mg | ORAL_TABLET | Freq: Once | ORAL | Status: AC
  Administered 2015-09-19: 800 mg via ORAL
  Filled 2015-09-19: qty 1
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
## 2015-09-19 NOTE — ED Notes (Signed)
Pt verbalized understanding of d/c instructions and has no further questions. Pt stable and nAD 

## 2015-09-19 NOTE — ED Notes (Signed)
Dr. Floyd at bedside. 

## 2015-09-19 NOTE — ED Notes (Signed)
Patient transported to X-ray 
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
## 2015-10-19 ENCOUNTER — Encounter (HOSPITAL_COMMUNITY): Payer: Self-pay | Admitting: Emergency Medicine

## 2015-10-19 ENCOUNTER — Emergency Department (HOSPITAL_COMMUNITY)
Admission: EM | Admit: 2015-10-19 | Discharge: 2015-10-19 | Disposition: A | Discharge status: Home / Self Care | Payer: No Typology Code available for payment source | Attending: Emergency Medicine | Admitting: Emergency Medicine

## 2015-10-19 DIAGNOSIS — F17210 Nicotine dependence, cigarettes, uncomplicated: Secondary | ICD-10-CM | POA: Insufficient documentation

## 2015-10-19 DIAGNOSIS — R519 Headache, unspecified: Secondary | ICD-10-CM

## 2015-10-19 DIAGNOSIS — R51 Headache: Secondary | ICD-10-CM | POA: Insufficient documentation

## 2015-10-19 DIAGNOSIS — Z8659 Personal history of other mental and behavioral disorders: Secondary | ICD-10-CM | POA: Insufficient documentation

## 2015-10-19 DIAGNOSIS — Z8719 Personal history of other diseases of the digestive system: Secondary | ICD-10-CM | POA: Insufficient documentation

## 2015-10-19 DIAGNOSIS — Z86018 Personal history of other benign neoplasm: Secondary | ICD-10-CM | POA: Insufficient documentation

## 2015-10-19 MED ORDER — SODIUM CHLORIDE 0.9 % IV BOLUS (SEPSIS)
1000.0000 mL | Freq: Once | INTRAVENOUS | Status: AC
  Administered 2015-10-19: 1000 mL via INTRAVENOUS

## 2015-10-19 MED ORDER — METOCLOPRAMIDE HCL 5 MG/ML IJ SOLN
10.0000 mg | Freq: Once | INTRAMUSCULAR | Status: AC
  Administered 2015-10-19: 10 mg via INTRAVENOUS
  Filled 2015-10-19: qty 2

## 2015-10-19 MED ORDER — KETOROLAC TROMETHAMINE 30 MG/ML IJ SOLN
30.0000 mg | Freq: Once | INTRAMUSCULAR | Status: AC
  Administered 2015-10-19: 30 mg via INTRAVENOUS
  Filled 2015-10-19: qty 1

## 2015-10-19 MED ORDER — DIPHENHYDRAMINE HCL 50 MG/ML IJ SOLN
25.0000 mg | Freq: Once | INTRAMUSCULAR | Status: AC
  Administered 2015-10-19: 25 mg via INTRAVENOUS
  Filled 2015-10-19: qty 1

## 2015-10-19 NOTE — Discharge Instructions (Signed)

## 2015-10-19 NOTE — ED Notes (Signed)
Pt reports intermittent HAs for the past several years. HA began again 2 days ago. Pain over forehead and behind both eyes. Pt unsure if loud noise or light makes pain worse. Pt reports nausea

## 2015-10-19 NOTE — ED Provider Notes (Signed)
CSN: [id number]     Arrival date & time 10/19/15  1824 History   First MD Initiated Contact with Patient 10/19/15 2006     Chief Complaint  Patient presents with  . Headache     (Consider location/radiation/quality/duration/timing/severity/associated sxs/prior Treatment) HPI Comments: Patient presents emergency department with chief complaint of headache. She reports having history of headaches for many years. She states that she gets them once every couple of weeks. She states that she has had a slowly worsening headache that started today. She has tried taking BC powder, which helped, but did not completely alleviate her headache. She denies any fevers chills. Denies any weakness, or tingling. States that she did have some nausea and vomiting earlier today. There are no modifying factors. Patient denies significant difficulty ambulating. She is never been seen by neurologist.  The history is provided by the patient. No language interpreter was used.    Past Medical History  Diagnosis Date  . Depression   . Hernia, umbilical   . Fibroid    Past Surgical History  Procedure Laterality Date  . Tubal ligation     Family History  Problem Relation Age of Onset  . Mental retardation Father    Social History  Substance Use Topics  . Smoking status: Current Every Day Smoker -- 1.00 packs/day    Types: Cigarettes  . Smokeless tobacco: Never Used  . Alcohol Use: Yes     Comment: social   OB History    Gravida Para Term Preterm AB TAB SAB Ectopic Multiple Living   5 4 4  1  1   4      Review of Systems  Constitutional: Negative for fever and chills.  Respiratory: Negative for shortness of breath.   Cardiovascular: Negative for chest pain.  Gastrointestinal: Negative for nausea, vomiting, diarrhea and constipation.  Genitourinary: Negative for dysuria.  Neurological: Positive for headaches.  All other systems reviewed and are negative.     Allergies  Review of patient's  allergies indicates no known allergies.  Home Medications   Prior to Admission medications   Medication Sig Start Date End Date Taking? Authorizing Provider  naproxen (NAPROSYN) 375 MG tablet Take 1 tablet (375 mg total) by mouth 2 (two) times daily. Patient not taking: Reported on 10/19/2015 05/07/15   Quintella Reichert, MD   BP 137/110 mmHg  Pulse 69  Temp(Src) 97.7 F (36.5 C) (Oral)  Resp 16  SpO2 95% Physical Exam  Constitutional: She is oriented to person, place, and time. She appears well-developed and well-nourished.  HENT:  Head: Normocephalic and atraumatic.  Right Ear: External ear normal.  Left Ear: External ear normal.  Eyes: Conjunctivae and EOM are normal. Pupils are equal, round, and reactive to light.  Neck: Normal range of motion. Neck supple.  No pain with neck flexion, no meningismus  Cardiovascular: Normal rate, regular rhythm and normal heart sounds.  Exam reveals no gallop and no friction rub.   No murmur heard. Pulmonary/Chest: Effort normal and breath sounds normal. No respiratory distress. She has no wheezes. She has no rales. She exhibits no tenderness.  Abdominal: Soft. She exhibits no distension and no mass. There is no tenderness. There is no rebound and no guarding.  Musculoskeletal: Normal range of motion. She exhibits no edema or tenderness.  Normal gait.  Neurological: She is alert and oriented to person, place, and time. She has normal reflexes.  CN 3-12 intact, normal finger to nose, no pronator drift, sensation and strength intact bilaterally.  Skin: Skin is warm and dry.  Psychiatric: She has a normal mood and affect. Her behavior is normal. Judgment and thought content normal.  Nursing note and vitals reviewed.   ED Course  Procedures (including critical care time)    MDM   Final diagnoses:  Nonintractable headache, unspecified chronicity pattern, unspecified headache type    Pt HA treated and improved while in ED.  Presentation is  like pts typical HA and non concerning for [facility], ICH, Meningitis, or temporal arteritis. Pt is afebrile with no focal neuro deficits, nuchal rigidity, or change in vision. Pt is to follow up with PCP to discuss prophylactic medication. Pt verbalizes understanding and is agreeable with plan to dc.      Montine Circle, PA-C 10/19/15 2216

## 2015-10-19 NOTE — ED Notes (Signed)
Pt reports having chronic headaches for years. Pt endorses dizziness and blurred vision with headache.

## 2015-10-19 NOTE — ED Notes (Signed)
PA at bedside.

## 2016-01-03 NOTE — ED Provider Notes (Signed)
Medical screening examination/treatment/procedure(s) were performed by non-physician practitioner and as supervising physician I was immediately available for consultation/collaboration.   EKG Interpretation None        Isla Pence, MD 01/03/16 1315

## 2016-04-29 ENCOUNTER — Encounter: Payer: Self-pay | Admitting: Obstetrics & Gynecology

## 2016-04-29 ENCOUNTER — Encounter (HOSPITAL_COMMUNITY): Payer: Self-pay | Admitting: *Deleted

## 2016-04-29 ENCOUNTER — Inpatient Hospital Stay (HOSPITAL_COMMUNITY)
Admission: AD | Admit: 2016-04-29 | Discharge: 2016-04-29 | Disposition: A | Discharge status: Home / Self Care | Payer: Self-pay | Source: Ambulatory Visit | Attending: Obstetrics and Gynecology | Admitting: Obstetrics and Gynecology

## 2016-04-29 DIAGNOSIS — M545 Low back pain, unspecified: Secondary | ICD-10-CM

## 2016-04-29 DIAGNOSIS — B9689 Other specified bacterial agents as the cause of diseases classified elsewhere: Secondary | ICD-10-CM

## 2016-04-29 DIAGNOSIS — G8929 Other chronic pain: Secondary | ICD-10-CM

## 2016-04-29 DIAGNOSIS — F17210 Nicotine dependence, cigarettes, uncomplicated: Secondary | ICD-10-CM | POA: Insufficient documentation

## 2016-04-29 DIAGNOSIS — R1032 Left lower quadrant pain: Secondary | ICD-10-CM

## 2016-04-29 DIAGNOSIS — R109 Unspecified abdominal pain: Secondary | ICD-10-CM

## 2016-04-29 DIAGNOSIS — N760 Acute vaginitis: Secondary | ICD-10-CM | POA: Insufficient documentation

## 2016-04-29 DIAGNOSIS — K0889 Other specified disorders of teeth and supporting structures: Secondary | ICD-10-CM

## 2016-04-29 DIAGNOSIS — R1031 Right lower quadrant pain: Secondary | ICD-10-CM

## 2016-04-29 DIAGNOSIS — R1030 Lower abdominal pain, unspecified: Secondary | ICD-10-CM | POA: Insufficient documentation

## 2016-04-29 DIAGNOSIS — Z3202 Encounter for pregnancy test, result negative: Secondary | ICD-10-CM | POA: Insufficient documentation

## 2016-04-29 LAB — WET PREP, GENITAL
SPERM: NONE SEEN
TRICH WET PREP: NONE SEEN
Yeast Wet Prep HPF POC: NONE SEEN

## 2016-04-29 LAB — COMPREHENSIVE METABOLIC PANEL
ALBUMIN: 3.5 g/dL (ref 3.5–5.0)
ALK PHOS: 52 U/L (ref 38–126)
ALT: 10 U/L — ABNORMAL LOW (ref 14–54)
ANION GAP: 6 (ref 5–15)
AST: 16 U/L (ref 15–41)
BUN: 10 mg/dL (ref 6–20)
CHLORIDE: 104 mmol/L (ref 101–111)
CO2: 27 mmol/L (ref 22–32)
Calcium: 8.9 mg/dL (ref 8.9–10.3)
Creatinine, Ser: 0.82 mg/dL (ref 0.44–1.00)
GFR calc non Af Amer: >60 mL/min (ref >60)
GLUCOSE: 95 mg/dL (ref 65–99)
POTASSIUM: 4.3 mmol/L (ref 3.5–5.1)
SODIUM: 137 mmol/L (ref 135–145)
Total Bilirubin: 0.5 mg/dL (ref 0.3–1.2)
Total Protein: 6.3 g/dL — ABNORMAL LOW (ref 6.5–8.1)

## 2016-04-29 LAB — URINALYSIS, ROUTINE W REFLEX MICROSCOPIC
Bilirubin Urine: NEGATIVE
GLUCOSE, UA: NEGATIVE mg/dL
Ketones, ur: NEGATIVE mg/dL
Leukocytes, UA: NEGATIVE
Nitrite: NEGATIVE
PH: 6 (ref 5.0–8.0)
Protein, ur: NEGATIVE mg/dL
Specific Gravity, Urine: 1.025 (ref 1.005–1.030)

## 2016-04-29 LAB — CBC
HCT: 36.2 % (ref 36.0–46.0)
HEMOGLOBIN: 13.5 g/dL (ref 12.0–15.0)
MCH: 31.5 pg (ref 26.0–34.0)
MCHC: 37.3 g/dL — AB (ref 30.0–36.0)
MCV: 84.4 fL (ref 78.0–100.0)
PLATELETS: 259 10*3/uL (ref 150–400)
RBC: 4.29 MIL/uL (ref 3.87–5.11)
RDW: 13.1 % (ref 11.5–15.5)
WBC: 8.9 10*3/uL (ref 4.0–10.5)

## 2016-04-29 LAB — URINE MICROSCOPIC-ADD ON

## 2016-04-29 LAB — GC/CHLAMYDIA PROBE AMP (~~LOC~~) NOT AT ARMC
CHLAMYDIA, DNA PROBE: NEGATIVE
NEISSERIA GONORRHEA: NEGATIVE

## 2016-04-29 LAB — POCT PREGNANCY, URINE: Preg Test, Ur: NEGATIVE

## 2016-04-29 MED ORDER — NAPROXEN SODIUM 550 MG PO TABS: 550.0000 mg | ORAL_TABLET | Freq: Two times a day (BID) | ORAL | 0 refills | Status: DC

## 2016-04-29 MED ORDER — METRONIDAZOLE 500 MG PO TABS: 500.0000 mg | ORAL_TABLET | Freq: Two times a day (BID) | ORAL | 0 refills | Status: DC

## 2016-04-29 MED ORDER — KETOROLAC TROMETHAMINE 60 MG/2ML IM SOLN
60.0000 mg | INTRAMUSCULAR | Status: AC
  Administered 2016-04-29: 60 mg via INTRAMUSCULAR
  Filled 2016-04-29: qty 2

## 2016-04-29 NOTE — MAU Provider Note (Signed)
Chief Complaint: No chief complaint on file.   None     SUBJECTIVE HPI: Renee Herrera is a 40 y.o. [id number] who presents to maternity admissions reporting lower abdominal and back pain x 2 months, intermittent shortness of breath x 1-2 months, and toothache x 3 days.  She reports she has regular bowel movements, denies dysuria or other urinary complaints. She has not changed her activity recently.  Her periods are regular and unchanged recently.  She has not seen primary care or OB/Gyn in "a while".  Her pain is unchanged since onset, not associated with any other symptoms, and not improved by rest, warm bath, or Aleve. She denies vaginal bleeding, vaginal itching/burning, urinary symptoms, h/a, dizziness, n/v, or fever/chills.     HPI  Past Medical History:  Diagnosis Date  . Depression   . Fibroid   . Hernia, umbilical    Past Surgical History:  Procedure Laterality Date  . TUBAL LIGATION     Social History   Social History  . Marital status: Single    Spouse name: N/A  . Number of children: N/A  . Years of education: N/A   Occupational History  . Not on file.   Social History Main Topics  . Smoking status: Current Every Day Smoker    Packs/day: 1.00    Types: Cigarettes  . Smokeless tobacco: Never Used  . Alcohol use Yes     Comment: social  . Drug use: No  . Sexual activity: Yes    Birth control/ protection: Surgical     Comment: Tubal Ligation   Other Topics Concern  . Not on file   Social History Narrative  . No narrative on file   No current facility-administered medications on file prior to encounter.    Current Outpatient Prescriptions on File Prior to Encounter  Medication Sig Dispense Refill  . naproxen (NAPROSYN) 375 MG tablet Take 1 tablet (375 mg total) by mouth 2 (two) times daily. (Patient not taking: Reported on 10/19/2015) 20 tablet 0  . [DISCONTINUED] metoCLOPramide (REGLAN) 10 MG tablet Take 1 tablet (10 mg total) by mouth every 6 (six)  hours. 6 tablet 0   No Known Allergies  ROS:  Review of Systems  Constitutional: Negative for chills, fatigue and fever.  HENT:       Tooth pain  Respiratory: Negative for shortness of breath.   Cardiovascular: Negative for chest pain.  Gastrointestinal: Positive for abdominal pain. Negative for constipation, diarrhea, nausea and vomiting.  Genitourinary: Positive for pelvic pain. Negative for difficulty urinating, dysuria, flank pain, vaginal bleeding, vaginal discharge and vaginal pain.  Neurological: Negative for dizziness and headaches.  Psychiatric/Behavioral: Negative.      I have reviewed patient's Past Medical Hx, Surgical Hx, Family Hx, Social Hx, medications and allergies.   Physical Exam   Patient Vitals for the past 24 hrs:  BP Temp Temp src Pulse Resp SpO2 Height Weight  04/29/16 0228 106/75 98.1 F (36.7 C) Oral 68 16 100 % 5\' 6"  (1.676 m) 147 lb (66.7 kg)   Constitutional: Well-developed, well-nourished female in no acute distress.  HEART: normal rate, heart sounds, regular rhythm RESP: normal effort, lung sounds clear and equal bilaterally GI: Abd soft, non-tender. Pos BS x 4 MS: Extremities nontender, no edema, normal ROM Neurologic: Alert and oriented x 4.  GU: Neg CVAT.  PELVIC EXAM: Cervix pink, visually closed, without lesion, scant white creamy discharge, vaginal walls and external genitalia normal Bimanual exam: Cervix 0/long/high, firm, anterior, neg  CMT, uterus nontender, nonenlarged, adnexa with mild tenderness on right, some enlargement palpable but no definitive mass   LAB RESULTS Results for orders placed or performed during the hospital encounter of 04/29/16 (from the past 24 hour(s))  Urinalysis, Routine w reflex microscopic (not at [facility])     Status: Abnormal   Collection Time: 04/29/16  2:30 AM  Result Value Ref Range   Color, Urine YELLOW YELLOW   APPearance CLEAR CLEAR   Specific Gravity, Urine 1.025 1.005 - 1.030   pH 6.0 5.0 - 8.0    Glucose, UA NEGATIVE NEGATIVE mg/dL   Hgb urine dipstick TRACE (A) NEGATIVE   Bilirubin Urine NEGATIVE NEGATIVE   Ketones, ur NEGATIVE NEGATIVE mg/dL   Protein, ur NEGATIVE NEGATIVE mg/dL   Nitrite NEGATIVE NEGATIVE   Leukocytes, UA NEGATIVE NEGATIVE  Urine microscopic-add on     Status: Abnormal   Collection Time: 04/29/16  2:30 AM  Result Value Ref Range   Squamous Epithelial / LPF 0-5 (A) NONE SEEN   WBC, UA 0-5 0 - 5 WBC/hpf   RBC / HPF 0-5 0 - 5 RBC/hpf   Bacteria, UA RARE (A) NONE SEEN   Urine-Other MUCOUS PRESENT   Pregnancy, urine POC     Status: None   Collection Time: 04/29/16  2:47 AM  Result Value Ref Range   Preg Test, Ur NEGATIVE NEGATIVE  CBC     Status: Abnormal   Collection Time: 04/29/16  4:41 AM  Result Value Ref Range   WBC 8.9 4.0 - 10.5 K/uL   RBC 4.29 3.87 - 5.11 MIL/uL   Hemoglobin 13.5 12.0 - 15.0 g/dL   HCT 36.2 36.0 - 46.0 %   MCV 84.4 78.0 - 100.0 fL   MCH 31.5 26.0 - 34.0 pg   MCHC 37.3 (H) 30.0 - 36.0 g/dL   RDW 13.1 11.5 - 15.5 %   Platelets 259 150 - 400 K/uL  Comprehensive metabolic panel     Status: Abnormal   Collection Time: 04/29/16  4:41 AM  Result Value Ref Range   Sodium 137 135 - 145 mmol/L   Potassium 4.3 3.5 - 5.1 mmol/L   Chloride 104 101 - 111 mmol/L   CO2 27 22 - 32 mmol/L   Glucose, Bld 95 65 - 99 mg/dL   BUN 10 6 - 20 mg/dL   Creatinine, Ser 0.82 0.44 - 1.00 mg/dL   Calcium 8.9 8.9 - 10.3 mg/dL   Total Protein 6.3 (L) 6.5 - 8.1 g/dL   Albumin 3.5 3.5 - 5.0 g/dL   AST 16 15 - 41 U/L   ALT 10 (L) 14 - 54 U/L   Alkaline Phosphatase 52 38 - 126 U/L   Total Bilirubin 0.5 0.3 - 1.2 mg/dL   GFR calc non Af Amer >60 >60 mL/min   GFR calc Af Amer >60 >60 mL/min   Anion gap 6 5 - 15  Wet prep, genital     Status: Abnormal   Collection Time: 04/29/16  5:20 AM  Result Value Ref Range   Yeast Wet Prep HPF POC NONE SEEN NONE SEEN   Trich, Wet Prep NONE SEEN NONE SEEN   Clue Cells Wet Prep HPF POC PRESENT (A) NONE SEEN   WBC,  Wet Prep HPF POC FEW (A) NONE SEEN   Sperm NONE SEEN        IMAGING No results found.  MAU Management/MDM: Ordered labs and reviewed results.  No acute abdominal process, no signs of infection on  today's exam.  Will order outpatient pelvic US, follow up in office for pelvic pain.  Treat BV for positive clue cells today with Flagyl.  Recommend pt seek primary care provider if symptoms persist/worsen.  Pt stable at time of discharge.  ASSESSMENT 1. BV (bacterial vaginosis)   2. Abdominal pain in female patient   3. Chronic bilateral lower abdominal pain   4. Pain in a tooth or teeth   5. Bilateral low back pain without sciatica, unspecified chronicity     PLAN Discharge home   Medication List    STOP taking these medications   naproxen 375 MG tablet Commonly known as:  NAPROSYN     TAKE these medications   metroNIDAZOLE 500 MG tablet Commonly known as:  FLAGYL Take 1 tablet (500 mg total) by mouth 2 (two) times daily.   naproxen sodium 550 MG tablet Commonly known as:  ANAPROX DS Take 1 tablet (550 mg total) by mouth 2 (two) times daily with a meal.      Follow-up Information    With Urgent Care or primary care provider if symptoms worsen/persist Follow up.        [facility] Follow up.   Specialty:  Radiology Why:  Ultrasound will call you to make an appointment Contact information: [address] [id number] [place] [facility] [place].   Specialty:  Obstetrics and Gynecology Why:  The office will call you to make an appointment. Return to MAU as needed for Gyn emergencies. Contact information: [place] Certified Nurse-Midwife 04/29/2016  6:00 AM

## 2016-04-29 NOTE — MAU Note (Signed)
Pt states she has been having lower abd pain, lower back pain, and shortness of breath off/on for 2 months. Denies nausea, vomiting, diarrhea, or fever. Denies dysuria. Pt also reports she has had a toothache for the last 3 days.

## 2016-04-29 NOTE — Discharge Instructions (Signed)
Pelvic Pain, Female °Female pelvic pain can be caused by many different things and start from a variety of places. Pelvic pain refers to pain that is located in the lower half of the abdomen and between your hips. The pain may occur over a short period of time (acute) or may be reoccurring (chronic). The cause of pelvic pain may be related to disorders affecting the female reproductive organs (gynecologic), but it may also be related to the bladder, kidney stones, an intestinal complication, or muscle or skeletal problems. Getting help right away for pelvic pain is important, especially if there has been severe, sharp, or a sudden onset of unusual pain. It is also important to get help right away because some types of pelvic pain can be life threatening.  °CAUSES  °Below are only some of the causes of pelvic pain. The causes of pelvic pain can be in one of several categories.  °· Gynecologic. °¨ Pelvic inflammatory disease. °¨ Sexually transmitted infection. °¨ Ovarian cyst or a twisted ovarian ligament (ovarian torsion). °¨ Uterine lining that grows outside the uterus (endometriosis). °¨ Fibroids, cysts, or tumors. °¨ Ovulation. °· Pregnancy. °¨ Pregnancy that occurs outside the uterus (ectopic pregnancy). °¨ Miscarriage. °¨ Labor. °¨ Abruption of the placenta or ruptured uterus. °· Infection. °¨ Uterine infection (endometritis). °¨ Bladder infection. °¨ Diverticulitis. °¨ Miscarriage related to a uterine infection (septic abortion). °· Bladder. °¨ Inflammation of the bladder (cystitis). °¨ Kidney stone(s). °· Gastrointestinal. °¨ Constipation. °¨ Diverticulitis. °· Neurologic. °¨ Trauma. °¨ Feeling pelvic pain because of mental or emotional causes (psychosomatic). °· Cancers of the bowel or pelvis. °EVALUATION  °Your caregiver will want to take a careful history of your concerns. This includes recent changes in your health, a careful gynecologic history of your periods (menses), and a sexual history. Obtaining  your family history and medical history is also important. Your caregiver may suggest a pelvic exam. A pelvic exam will help identify the location and severity of the pain. It also helps in the evaluation of which organ system may be involved. In order to identify the cause of the pelvic pain and be properly treated, your caregiver may order tests. These tests may include:  °· A pregnancy test. °· Pelvic ultrasonography. °· An X-ray exam of the abdomen. °· A urinalysis or evaluation of vaginal discharge. °· Blood tests. °HOME CARE INSTRUCTIONS  °· Only take over-the-counter or prescription medicines for pain, discomfort, or fever as directed by your caregiver.   °· Rest as directed by your caregiver.   °· Eat a balanced diet.   °· Drink enough fluids to make your urine clear or pale yellow, or as directed.   °· Avoid sexual intercourse if it causes pain.   °· Apply warm or cold compresses to the lower abdomen depending on which one helps the pain.   °· Avoid stressful situations.   °· Keep a journal of your pelvic pain. Write down when it started, where the pain is located, and if there are things that seem to be associated with the pain, such as food or your menstrual cycle. °· Follow up with your caregiver as directed.   °SEEK MEDICAL CARE IF: °· Your medicine does not help your pain. °· You have abnormal vaginal discharge. °SEEK IMMEDIATE MEDICAL CARE IF:  °· You have heavy bleeding from the vagina.   °· Your pelvic pain increases.   °· You feel light-headed or faint.   °· You have chills.   °· You have pain with urination or blood in your urine.   °· You have uncontrolled   diarrhea or vomiting.   You have a fever or persistent symptoms for more than 3 days.  You have a fever and your symptoms suddenly get worse.   You are being physically or sexually abused.   This information is not intended to replace advice given to you by your health care provider. Make sure you discuss any questions you have with  your health care provider.   Document Released: 05/07/2004 Document Revised: 03/01/2015 Document Reviewed: 09/30/2011 Elsevier Interactive Patient Education 2016 [employer].  [facility] Guide (Revised August 2014)   Chronic Pain Problems:   [facility]:  [phone]           Patients need to be referred by their primary care doctor/specialist  Insufficient Money for Medicine:           United Way: call "211"    MAP Program at [place] or [place] [phone]            No Primary Care Doctor:  To locate a primary care doctor that accepts your insurance or provides certain services:           [place]: [phone]           Physician Referral Service: [phone] ask for My [place]  If no insurance, you need to see if you qualify for [facility] orange card, call to set      up appointment for eligibility/enrollment at [phone] or [phone] or visit [place] ([address], [place] and [place]) to meet with a [facility] enrollment specialist.  Agencies that provide inexpensive (sliding fee scale) medical care:       [facility] - Family Medicine at [place] - [phone]     [facility] and [place] - [facility] - [place] [phone]     [facility] - [place] [phone]  [facility] - [place] @ [place] [phone]  [facility] - [place] @ [place] - [phone]  [facility]: [phone]   Women's Clinic: [phone]   Planned Parenthood: [phone]   [facility] [place]    [place] Providers:           [facility] [phone] (No Family Planning accepted)          [address], [phone], Mon-Fri 9am-5pm          [facility] - [phone]  [place], [address] [place], Mon-Thursday 8am-5pm, Fri 8am-noon  [facility] [place] - [place], [address], Mon-Fri 7:30am-4:30pm          [place] - [phone]          [address] [facility] - [phone] [address]          Only accepts [employer]  Medicaid patients after they have their name applied to their card  Self Pay (no insurance) in [facility]:           Sickle Cell Patients:   [place], [phone] [facility]:  [address], [place] [phone]       [facility]  [address], [place] [phone]  [facility] Family Practice:  [address] [phone]          [facility] Urgent Care           [place], [phone] [facility] [place], [phone]           [facility]gent Care [place]           [place] [address], [place]        [facility] - [address]           [phone], Mon-Fri 9am-7pm, [place] 9am-1pm          [facility] - Family Medicine @ [facility]          [place], [place]          [facility] - [facility]           [address], [place] [facility] Adult and [place]  [address] [place] [phone]          [place], [place]  [facility] - [place]   [place],  [phone] [facility] - [facility]  [address] [phone]  Dr. Vista Lawman           [address], [place], [facility]           [address] [phone]          [facility]             [address], [phone]          [facility]           [place], [place], 1st & 3rd Saturday every month, 10am-1pm  OTHERS:  [place]  ([facility] Only)  [phone] (Thursday only)  Strategies for finding a Primary Care Provider:  1) Find a Doctor and Pay Out of Pocket  Although you won't have to find out who is covered by your insurance plan, it is a good idea to ask around and get recommendations. You will then need to call the office and see if the doctor you have chosen will accept you as a new patient and what types of options they offer for patients who are self-pay. Some doctors offer discounts or will set up payment plans for their patients who do not have insurance, but you will need to ask so you aren't surprised when you get to your appointment.  2) [place] - To see if you qualify for orange card access to healthcare safety net providers.  Call for  appointment for eligibility/enrollment at [phone] or [phone]. (Uninsured, 0-200% FPL, qualifying info)  Applicants for [facility] are first required to see if they are eligible to enroll in the [employer] [place] Marketplace before enrolling in [facility] (and get an exemption if they are not).  GCCN Criteria for acceptance is:  ? Proof of ACA Marketing exemption - form or documentation  ? Valid photo ID (driver's license, state identification card, passport, home country ID)  ? Proof of [facility] residency (e.g. drivers license, lease/landlord information, pay stubs with address, utility bill, bank statement, etc.)  ? Proof of income (1040, last year's tax  return, W2, 4 current pay stubs, other income proof)  ? Proof of assets (current bank statement + 3 most recent, disability paperwork, life insurance info, tax value on autos, etc.)  3) [place] Department  Not all health departments have doctors that can see patients for sick visits, but many do, so it is worth a call to see if yours does. If you don't know where your local health department is, you can check in your phone book. The CDC also has a tool to help you locate your state's health department, and many state websites also have listings of all of their local health departments.  4) Find a [facility]  If your illness is not likely to be very severe or complicated, you may want to try a walk in clinic. These are popping up all over the country in pharmacies, drugstores, and shopping centers. They're usually staffed by nurse practitioners or physician assistants that have been trained to treat common illnesses and complaints. They're usually fairly quick and inexpensive. However, if you have serious medical issues or chronic medical problems, these are probably not your best option   STD Testing:           [place], [place] [facility]           [address] [place], phone [phone] or [phone]           Monday - Friday, call for an appointment          [place], [place] [facility]           [address], [place], phone [phone] or [phone]           Monday - Friday, call for an appointment Abuse/Neglect:           [place]: [place]: [phone] (After Hours)  Emergency Shelter:  [facility] [phone]  [place]- [phone]  [place] - [phone]  [place] - [phone] (ages [phone])  [place] @ [employer] - [phone]   Mammograms - Free at [facility] [place]:           Room at the [place]: [phone]   (Homeless mother with children)          [place]: [phone] (Mothers only)  Youth Focus: [phone] (Pregnant 47-87 years old)  Adopt a Mom -[phone]  [facility]   [facility] and [place]  [address], [place] [phone]          [facility] [facility]  of [address], [place], [place]          [facility] Dept.           [place], [place]          [facility]           [phone]          [facility] in [place]           [phone], [place]           [phone]           [phone] (After Hours)  [place] Abuse Resources:           Alcohol and Drug Services: [phone]           [facility]: [phone]          [facility]: [phone]   Narcotics Helpline - [phone]          [facility]: [phone]           Residential & Outpatient Substance Abuse Program - [facility] [place]: [phone]  [employer]  [place] and [place] - [phone] Psychological Services:          [place]: [phone]   Therapeutic Alternatives: [phone]          [place]           [address]: [phone]     (24 Hour)  Mobile Crisis:   [place]:  Radio producer on [place] [phone] [facility] on [place] [phone]  [facility] In [place]  ([place] - [phone] or [phone]  [place]. [place] [phone]  [place] [address], [place] [phone]   Dental Assistance:  If unable to pay or uninsured, contact: [facility]. to become qualified for the adult dental clinic. Patient must be enrolled in [facility] (uninsured, 0-200% FPL, qualifying info).  Enroll in [facility] first, then see Primary Care Physician assigned to you, the PCP makes a dental referral. [facility] will receive referral and contacts patient for appointment.  Patients with Medicaid           [address], [place] (Children up to 15 + Pregnant Women) - [phone]  [address] - [address] [phone]  If unable to pay, or uninsured: contact [place] [phone] in [place] - ([place] only + Pregnant Women), [phone] in [place] only) to become qualified for the adult dental clinic  Must see if eligible to enroll in [place] before enrolling into the [facility] (exemption required) [phone] for an appointment)  [email or web address];   [phone].  If not eligible for ACA, then go by Department of Health and Human Services to see if eligible for orange card.  [address] [place].  Once you get an orange card, you will have a Primary Care home who will then refer you to dental if needed.        Other Scientist, forensic:   [facility] [phone] (ext [phone])   [address]  Dr. Donn Pierini - [phone]   [place]  [place]   [facility]           [place], [place], [place], [address]           [phone], Ext. [phone]           2nd and 4th Thursday of the month at 6:30am (Simple extractions only - no wisdom teeth or surgery) First come/First serve -First 10 clients served           [facility], [place] and [place] residents only)          [address], [place], [place], [address]           [phone]                    [place] Department           [phone]          [place] Department          [phone]         [facility]          [phone]   Transportation Options:  Ambulance - 911 - [phone] per ride Family Member to accompany patient (if stable) - [place] - [phone]  PART - [phone]  Taxi - [phone] - [place] [phone] (Application required)  [facility] - [phone]

## 2016-05-01 ENCOUNTER — Ambulatory Visit (HOSPITAL_COMMUNITY)
Admission: RE | Admit: 2016-05-01 | Discharge: 2016-05-01 | Disposition: A | Discharge status: Home / Self Care | Payer: Self-pay | Source: Ambulatory Visit | Attending: Advanced Practice Midwife | Admitting: Advanced Practice Midwife

## 2016-05-01 DIAGNOSIS — R109 Unspecified abdominal pain: Secondary | ICD-10-CM

## 2016-05-01 DIAGNOSIS — R1030 Lower abdominal pain, unspecified: Secondary | ICD-10-CM | POA: Insufficient documentation

## 2016-05-01 DIAGNOSIS — D251 Intramural leiomyoma of uterus: Secondary | ICD-10-CM | POA: Insufficient documentation

## 2016-05-01 DIAGNOSIS — M549 Dorsalgia, unspecified: Secondary | ICD-10-CM | POA: Insufficient documentation

## 2016-06-11 ENCOUNTER — Encounter: Payer: Self-pay | Admitting: Obstetrics & Gynecology

## 2016-06-12 ENCOUNTER — Telehealth: Payer: Self-pay | Admitting: *Deleted

## 2016-06-12 NOTE — Telephone Encounter (Signed)
Message left, patient would like the results of her u/s.

## 2016-06-13 ENCOUNTER — Telehealth: Payer: Self-pay | Admitting: *Deleted

## 2016-06-13 NOTE — Telephone Encounter (Signed)
Patient left another message on nurse voicemail 06/12/16 at 0902.  Patient states she missed her appointment on 06/11/16 and would like her results.  States she has left multiple messages and hasn't received a return call.

## 2016-06-13 NOTE — Telephone Encounter (Signed)
Pt's concerns have been addressed in another telephone encounter.

## 2016-06-13 NOTE — Telephone Encounter (Signed)
Patient left message on nurse voicemail on 06/12/16 at 1126.  States she had appointment yesterday 06/11/16 at 3 pm.  States she would like the results of her U/S from about 1 month ago.  Requests a return call.

## 2016-06-13 NOTE — Telephone Encounter (Signed)
Called pt and informed her of [place] results from 11/8 showing same fibroid as her last [place] in 2011. No other abnormalities and no explanation for her pain based on these results. Pt states she is still having back pain and has scheduled follow up in our office on 1/16. She voiced understanding of all information given.

## 2016-07-09 ENCOUNTER — Encounter: Payer: Self-pay | Admitting: Obstetrics and Gynecology

## 2016-08-11 ENCOUNTER — Encounter (HOSPITAL_COMMUNITY): Payer: Self-pay | Admitting: Emergency Medicine

## 2016-08-11 ENCOUNTER — Emergency Department (HOSPITAL_COMMUNITY)
Admission: EM | Admit: 2016-08-11 | Discharge: 2016-08-11 | Disposition: A | Discharge status: Home / Self Care | Payer: Self-pay | Attending: Emergency Medicine | Admitting: Emergency Medicine

## 2016-08-11 DIAGNOSIS — F17210 Nicotine dependence, cigarettes, uncomplicated: Secondary | ICD-10-CM | POA: Insufficient documentation

## 2016-08-11 DIAGNOSIS — J111 Influenza due to unidentified influenza virus with other respiratory manifestations: Secondary | ICD-10-CM | POA: Insufficient documentation

## 2016-08-11 DIAGNOSIS — R69 Illness, unspecified: Secondary | ICD-10-CM

## 2016-08-11 MED ORDER — IBUPROFEN 800 MG PO TABS
800.0000 mg | ORAL_TABLET | Freq: Once | ORAL | Status: AC
  Administered 2016-08-11: 800 mg via ORAL
  Filled 2016-08-11: qty 1

## 2016-08-11 MED ORDER — ACETAMINOPHEN 325 MG PO TABS
650.0000 mg | ORAL_TABLET | Freq: Once | ORAL | Status: AC
  Administered 2016-08-11: 650 mg via ORAL

## 2016-08-11 MED ORDER — ALBUTEROL SULFATE HFA 108 (90 BASE) MCG/ACT IN AERS
2.0000 | INHALATION_SPRAY | Freq: Once | RESPIRATORY_TRACT | Status: AC
  Administered 2016-08-11: 2 via RESPIRATORY_TRACT
  Filled 2016-08-11: qty 6.7

## 2016-08-11 MED ORDER — ACETAMINOPHEN 325 MG PO TABS
ORAL_TABLET | ORAL | Status: AC
  Filled 2016-08-11: qty 2

## 2016-08-11 NOTE — ED Triage Notes (Signed)
Patient with flu like symptoms for two to three days.  She has been using OTC meds and has not felt better yet.  Patient here to be seen.

## 2016-08-11 NOTE — ED Provider Notes (Signed)
[facility] DEPT Provider Note   CSN: [id number] Arrival date & time: 08/11/16  0030  By signing my name below, I, Reola Mosher, attest that this documentation has been prepared under the direction and in the presence of Ripley Fraise, MD. Electronically Signed: Reola Mosher, ED Scribe. 08/11/16. 1:29 AM.  History   Chief Complaint Chief Complaint  Patient presents with  . Fever   The history is provided by the patient. No language interpreter was used.  Influenza  Presenting symptoms: cough, fever, headache, myalgias (generalized) and vomiting ( +post-tussive)   Presenting symptoms: no diarrhea and no sore throat   Severity:  Moderate Onset quality:  Gradual Duration:  3 days Progression:  Worsening Chronicity:  New Relieved by:  Nothing Worsened by:  Nothing Ineffective treatments:  OTC medications Associated symptoms: chills   Associated symptoms: no syncope   Risk factors: no diabetes problem, no heart disease, no kidney disease and no liver disease     HPI Comments: Renee Herrera is a 41 y.o. female BIB EMS, with no pertinent PMHx, who presents to the Emergency Department complaining of gradual onset, waxing and waning fever (febrile in the ED at 101.1) beginning three days ago. She notes associated cough, post-tussive emesis, headache, chills, and generalized myalgias/arthralgias. Pt has been taking OTC medications at home without relief of her symptoms. She is a current, everyday smoker. No recent travel outside of the country. Pt denies sore throat, syncope, diarrhea, or any other associated symptoms.   Past Medical History:  Diagnosis Date  . Depression   . Fibroid   . Hernia, umbilical    There are no active problems to display for this patient.  Past Surgical History:  Procedure Laterality Date  . TUBAL LIGATION     OB History    Gravida Para Term Preterm AB Living   5 4 4   1 4    SAB TAB Ectopic Multiple Live Births   1       4      Home Medications    Prior to Admission medications   Medication Sig Start Date End Date Taking? Authorizing Provider  metroNIDAZOLE (FLAGYL) 500 MG tablet Take 1 tablet (500 mg total) by mouth 2 (two) times daily. 04/29/16   Lattie Haw A Leftwich-Kirby, CNM  naproxen sodium (ANAPROX DS) 550 MG tablet Take 1 tablet (550 mg total) by mouth 2 (two) times daily with a meal. 04/29/16   Elvera Maria, CNM   Family History Family History  Problem Relation Age of Onset  . Mental retardation Father    Social History Social History  Substance Use Topics  . Smoking status: Current Every Day Smoker    Packs/day: 1.00    Types: Cigarettes  . Smokeless tobacco: Never Used  . Alcohol use Yes     Comment: social   Allergies   Patient has no known allergies.   Review of Systems Review of Systems  Constitutional: Positive for chills and fever.  HENT: Negative for sore throat.   Respiratory: Positive for cough.   Gastrointestinal: Positive for vomiting ( +post-tussive). Negative for diarrhea.  Musculoskeletal: Positive for arthralgias (generalized) and myalgias (generalized).  Neurological: Positive for headaches. Negative for syncope.  All other systems reviewed and are negative.  Physical Exam Updated Vital Signs BP 92/55 (BP Location: Right Arm)   Pulse 79   Temp 101.1 F (38.4 C) (Oral)   Resp 20   SpO2 100%   Physical Exam CONSTITUTIONAL: Well developed/well nourished HEAD:  Normocephalic/atraumatic EYES: EOMI/PERRL ENMT: Mucous membranes moist. Uvula midline, No erythema or exudates. NECK: supple no meningeal signs SPINE/BACK:entire spine nontender CV: S1/S2 noted, no murmurs/rubs/gallops noted LUNGS: Scattered wheezing noted bilaterally, no apparent distress ABDOMEN: soft, nontender, no rebound or guarding, bowel sounds noted throughout abdomen GU:no cva tenderness NEURO: Pt is awake/alert/appropriate, moves all extremitiesx4.  No facial droop.   EXTREMITIES: pulses  normal/equal, full ROM SKIN: warm, color normal PSYCH: no abnormalities of mood noted, alert and oriented to situation  ED Treatments / Results  DIAGNOSTIC STUDIES: Oxygen Saturation is 100% on RA, normal by my interpretation.   COORDINATION OF CARE: 1:26 AM-Discussed next steps with pt. Pt verbalized understanding and is agreeable with the plan.   Labs (all labs ordered are listed, but only abnormal results are displayed) Labs Reviewed - No data to display  EKG  EKG Interpretation None      Radiology No results found.  Procedures Procedures   Medications Ordered in ED Medications  acetaminophen (TYLENOL) tablet 650 mg (650 mg Oral Given 08/11/16 0045)   Initial Impression / Assessment and Plan / ED Course  I have reviewed the triage vital signs and the nursing notes.   Pt well appearing She is resting comfortably On my assessment at discharge, pulse ox >95% No signs of resp distress Probable influenza but since symptoms >48 hrs and she is low risk, defer testing/tamiflu Discussed strict return precautions with patient  Final Clinical Impressions(s) / ED Diagnoses   Final diagnoses:  Influenza-like illness   New Prescriptions New Prescriptions   No medications on file   I personally performed the services described in this documentation, which was scribed in my presence. The recorded information has been reviewed and is accurate.       Ripley Fraise, MD 08/11/16 [phone]

## 2016-08-11 NOTE — ED Notes (Signed)
Patient is in Peds with child.  

## 2016-10-28 ENCOUNTER — Inpatient Hospital Stay (HOSPITAL_COMMUNITY)
Admission: AD | Admit: 2016-10-28 | Discharge: 2016-10-28 | Disposition: A | Discharge status: Home / Self Care | Payer: Self-pay | Source: Ambulatory Visit | Attending: Obstetrics & Gynecology | Admitting: Obstetrics & Gynecology

## 2016-10-28 ENCOUNTER — Encounter (HOSPITAL_COMMUNITY): Payer: Self-pay

## 2016-10-28 DIAGNOSIS — F17210 Nicotine dependence, cigarettes, uncomplicated: Secondary | ICD-10-CM | POA: Insufficient documentation

## 2016-10-28 DIAGNOSIS — Z79899 Other long term (current) drug therapy: Secondary | ICD-10-CM | POA: Insufficient documentation

## 2016-10-28 DIAGNOSIS — N760 Acute vaginitis: Secondary | ICD-10-CM | POA: Insufficient documentation

## 2016-10-28 DIAGNOSIS — B373 Candidiasis of vulva and vagina: Secondary | ICD-10-CM | POA: Insufficient documentation

## 2016-10-28 DIAGNOSIS — N939 Abnormal uterine and vaginal bleeding, unspecified: Secondary | ICD-10-CM | POA: Insufficient documentation

## 2016-10-28 DIAGNOSIS — B9689 Other specified bacterial agents as the cause of diseases classified elsewhere: Secondary | ICD-10-CM | POA: Insufficient documentation

## 2016-10-28 DIAGNOSIS — B3731 Acute candidiasis of vulva and vagina: Secondary | ICD-10-CM

## 2016-10-28 DIAGNOSIS — D259 Leiomyoma of uterus, unspecified: Secondary | ICD-10-CM | POA: Insufficient documentation

## 2016-10-28 LAB — CBC WITH DIFFERENTIAL/PLATELET
Basophils Absolute: 0 10*3/uL (ref 0.0–0.1)
Basophils Relative: 0 %
Eosinophils Absolute: 0.1 10*3/uL (ref 0.0–0.7)
Eosinophils Relative: 1 %
HEMATOCRIT: 38.2 % (ref 36.0–46.0)
HEMOGLOBIN: 14.1 g/dL (ref 12.0–15.0)
LYMPHS ABS: 2.5 10*3/uL (ref 0.7–4.0)
LYMPHS PCT: 30 %
MCH: 30.9 pg (ref 26.0–34.0)
MCHC: 36.9 g/dL — AB (ref 30.0–36.0)
MCV: 83.6 fL (ref 78.0–100.0)
MONO ABS: 0.6 10*3/uL (ref 0.1–1.0)
MONOS PCT: 7 %
Neutro Abs: 5.3 10*3/uL (ref 1.7–7.7)
Neutrophils Relative %: 62 %
Platelets: 302 10*3/uL (ref 150–400)
RBC: 4.57 MIL/uL (ref 3.87–5.11)
RDW: 12.9 % (ref 11.5–15.5)
WBC: 8.4 10*3/uL (ref 4.0–10.5)

## 2016-10-28 LAB — POCT PREGNANCY, URINE: Preg Test, Ur: NEGATIVE

## 2016-10-28 LAB — URINALYSIS, ROUTINE W REFLEX MICROSCOPIC
BILIRUBIN URINE: NEGATIVE
GLUCOSE, UA: NEGATIVE mg/dL
KETONES UR: 5 mg/dL — AB
NITRITE: NEGATIVE
PH: 5 (ref 5.0–8.0)
Protein, ur: NEGATIVE mg/dL
SPECIFIC GRAVITY, URINE: 1.02 (ref 1.005–1.030)

## 2016-10-28 LAB — WET PREP, GENITAL
Sperm: NONE SEEN
Trich, Wet Prep: NONE SEEN

## 2016-10-28 MED ORDER — IBUPROFEN 600 MG PO TABS: 600.0000 mg | ORAL_TABLET | Freq: Four times a day (QID) | ORAL | 0 refills | Status: DC | PRN

## 2016-10-28 MED ORDER — METRONIDAZOLE 500 MG PO TABS: 500.0000 mg | ORAL_TABLET | Freq: Two times a day (BID) | ORAL | 0 refills | Status: DC

## 2016-10-28 MED ORDER — FLUCONAZOLE 150 MG PO TABS: 150.0000 mg | ORAL_TABLET | Freq: Every day | ORAL | 1 refills | Status: DC

## 2016-10-28 NOTE — MAU Note (Signed)
Had period last month.  Went off then came right back on for a wk, went off and is back on again.feels like she is bleeding to much.  Seeing the most blood when she wipes after using the restroom

## 2016-10-28 NOTE — Discharge Instructions (Signed)
Abnormal Uterine Bleeding Abnormal uterine bleeding can affect women at various stages in life, including teenagers, women in their reproductive years, pregnant women, and women who have reached menopause. Several kinds of uterine bleeding are considered abnormal, including:  Bleeding or spotting between periods.  Bleeding after sexual intercourse.  Bleeding that is heavier or more than normal.  Periods that last longer than usual.  Bleeding after menopause. Many cases of abnormal uterine bleeding are minor and simple to treat, while others are more serious. Any type of abnormal bleeding should be evaluated by your health care provider. Treatment will depend on the cause of the bleeding. Follow these instructions at home: Monitor your condition for any changes. The following actions may help to alleviate any discomfort you are experiencing:  Avoid the use of tampons and douches as directed by your health care provider.  Change your pads frequently. You should get regular pelvic exams and Pap tests. Keep all follow-up appointments for diagnostic tests as directed by your health care provider. Contact a health care provider if:  Your bleeding lasts more than 1 week.  You feel dizzy at times. Get help right away if:  You pass out.  You are changing pads every 15 to 30 minutes.  You have abdominal pain.  You have a fever.  You become sweaty or weak.  You are passing large blood clots from the vagina.  You start to feel nauseous and vomit. This information is not intended to replace advice given to you by your health care provider. Make sure you discuss any questions you have with your health care provider. Document Released: 06/10/2005 Document Revised: 11/22/2015 Document Reviewed: 01/07/2013 Elsevier Interactive Patient Education  2017 [employer]  Bacterial Vaginosis Bacterial vaginosis is an infection of the vagina. It happens when too many germs (bacteria) grow in the  vagina. This infection puts you at risk for infections from sex (STIs). Treating this infection can lower your risk for some STIs. You should also treat this if you are pregnant. It can cause your baby to be born early. Follow these instructions at home: Medicines   Take over-the-counter and prescription medicines only as told by your doctor.  Take or use your antibiotic medicine as told by your doctor. Do not stop taking or using it even if you start to feel better. General instructions   If you your sexual partner is a woman, tell her that you have this infection. She needs to get treatment if she has symptoms. If you have a female partner, he does not need to be treated.  During treatment:  Avoid sex.  Do not douche.  Avoid alcohol as told.  Avoid breastfeeding as told.  Drink enough fluid to keep your pee (urine) clear or pale yellow.  Keep your vagina and butt (rectum) clean.  Wash the area with warm water every day.  Wipe from front to back after you use the toilet.  Keep all follow-up visits as told by your doctor. This is important. Preventing this condition   Do not douche.  Use only warm water to wash around your vagina.  Use protection when you have sex. This includes:  Latex condoms.  Dental dams.  Limit how many people you have sex with. It is best to only have sex with the same person (be monogamous).  Get tested for STIs. Have your partner get tested.  Wear underwear that is cotton or lined with cotton.  Avoid tight pants and pantyhose. This is most important  in summer.  Do not use any products that have nicotine or tobacco in them. These include cigarettes and e-cigarettes. If you need help quitting, ask your doctor.  Do not use illegal drugs.  Limit how much alcohol you drink. Contact a doctor if:  Your symptoms do not get better, even after you are treated.  You have more discharge or pain when you pee (urinate).  You have a fever.  You  have pain in your belly (abdomen).  You have pain with sex.  Your bleed from your vagina between periods. Summary  This infection happens when too many germs (bacteria) grow in the vagina.  Treating this condition can lower your risk for some infections from sex (STIs).  You should also treat this if you are pregnant. It can cause early (premature) birth.  Do not stop taking or using your antibiotic medicine even if you start to feel better. This information is not intended to replace advice given to you by your health care provider. Make sure you discuss any questions you have with your health care provider. Document Released: 03/19/2008 Document Revised: 02/24/2016 Document Reviewed: 02/24/2016 Elsevier Interactive Patient Education  2017 [employer].

## 2016-10-28 NOTE — MAU Provider Note (Signed)
History     CSN: [id number]  Arrival date and time: 10/28/16 1621   First Provider Initiated Contact with Patient 10/28/16 1912      Chief Complaint  Patient presents with  . Vaginal Bleeding   HPI [name] is a 41 y.o. [id number] at who presents to MAU today with complaint of irregular vaginal bleeding. The patient states that she has had 3 episodes of bleeding x 1 week each in the past 4-6 weeks. She states this episode started on 10/26/16. She states bleeding is moderate. She states 5/10 lower abdominal pain. She has not taken anything for pain. She states some dizziness earlier that resolved with sitting. She denies weakness or fatigue. She has a history of fibroids noted on previous US.   OB History    Gravida Para Term Preterm AB Living   5 4 4   1 4    SAB TAB Ectopic Multiple Live Births   1       4      Past Medical History:  Diagnosis Date  . Depression   . Fibroid   . Hernia, umbilical     Past Surgical History:  Procedure Laterality Date  . TUBAL LIGATION      Family History  Problem Relation Age of Onset  . Mental retardation Father     Social History  Substance Use Topics  . Smoking status: Current Every Day Smoker    Packs/day: 1.00    Types: Cigarettes  . Smokeless tobacco: Never Used  . Alcohol use Yes     Comment: social    Allergies: No Known Allergies  Prescriptions Prior to Admission  Medication Sig Dispense Refill Last Dose  . albuterol (PROVENTIL HFA;VENTOLIN HFA) 108 (90 Base) MCG/ACT inhaler Inhale 2 puffs into the lungs every 6 (six) hours as needed for wheezing or shortness of breath.   Rescue    Review of Systems  Constitutional: Negative for fever.  Gastrointestinal: Positive for abdominal pain. Negative for constipation, diarrhea, nausea and vomiting.  Genitourinary: Positive for vaginal bleeding. Negative for dysuria, frequency, urgency and vaginal discharge.   Physical Exam   Blood pressure 101/62, pulse 61,  temperature 97.8 F (36.6 C), temperature source Oral, resp. rate 16, weight 142 lb 8 oz (64.6 kg), last menstrual period 10/26/2016, SpO2 100 %.  Physical Exam  Nursing note and vitals reviewed. Constitutional: She is oriented to person, place, and time. She appears well-developed and well-nourished. No distress.  HENT:  Head: Normocephalic and atraumatic.  Cardiovascular: Normal rate.   Respiratory: Effort normal.  GI: Soft. She exhibits no distension and no mass. There is no tenderness. There is no rebound and no guarding.  Genitourinary: Uterus is not enlarged and not tender. Cervix exhibits no motion tenderness and no discharge. Right adnexum displays no mass and no tenderness. Left adnexum displays no mass and no tenderness. There is bleeding (small) in the vagina. No vaginal discharge found.  Neurological: She is alert and oriented to person, place, and time.  Skin: Skin is warm and dry. No erythema.  Psychiatric: She has a normal mood and affect.    Results for orders placed or performed during the hospital encounter of 10/28/16 (from the past 24 hour(s))  Urinalysis, Routine w reflex microscopic     Status: Abnormal   Collection Time: 10/28/16  4:55 PM  Result Value Ref Range   Color, Urine YELLOW YELLOW   APPearance HAZY (A) CLEAR   Specific Gravity, Urine 1.020 1.005 -  1.030   pH 5.0 5.0 - 8.0   Glucose, UA NEGATIVE NEGATIVE mg/dL   Hgb urine dipstick LARGE (A) NEGATIVE   Bilirubin Urine NEGATIVE NEGATIVE   Ketones, ur 5 (A) NEGATIVE mg/dL   Protein, ur NEGATIVE NEGATIVE mg/dL   Nitrite NEGATIVE NEGATIVE   Leukocytes, UA SMALL (A) NEGATIVE   RBC / HPF 0-5 0 - 5 RBC/hpf   WBC, UA 6-30 0 - 5 WBC/hpf   Bacteria, UA FEW (A) NONE SEEN   Squamous Epithelial / LPF 0-5 (A) NONE SEEN   Mucous PRESENT   Pregnancy, urine POC     Status: None   Collection Time: 10/28/16  5:17 PM  Result Value Ref Range   Preg Test, Ur NEGATIVE NEGATIVE  CBC with Differential/Platelet      Status: Abnormal   Collection Time: 10/28/16  5:32 PM  Result Value Ref Range   WBC 8.4 4.0 - 10.5 K/uL   RBC 4.57 3.87 - 5.11 MIL/uL   Hemoglobin 14.1 12.0 - 15.0 g/dL   HCT 38.2 36.0 - 46.0 %   MCV 83.6 78.0 - 100.0 fL   MCH 30.9 26.0 - 34.0 pg   MCHC 36.9 (H) 30.0 - 36.0 g/dL   RDW 12.9 11.5 - 15.5 %   Platelets 302 150 - 400 K/uL   Neutrophils Relative % 62 %   Neutro Abs 5.3 1.7 - 7.7 K/uL   Lymphocytes Relative 30 %   Lymphs Abs 2.5 0.7 - 4.0 K/uL   Monocytes Relative 7 %   Monocytes Absolute 0.6 0.1 - 1.0 K/uL   Eosinophils Relative 1 %   Eosinophils Absolute 0.1 0.0 - 0.7 K/uL   Basophils Relative 0 %   Basophils Absolute 0.0 0.0 - 0.1 K/uL  Wet prep, genital     Status: Abnormal   Collection Time: 10/28/16  7:25 PM  Result Value Ref Range   Yeast Wet Prep HPF POC PRESENT (A) NONE SEEN   Trich, Wet Prep NONE SEEN NONE SEEN   Clue Cells Wet Prep HPF POC PRESENT (A) NONE SEEN   WBC, Wet Prep HPF POC FEW (A) NONE SEEN   Sperm NONE SEEN     MAU Course  Procedures None  MDM UPT - negative UA, wet prep, GC/Chlamydia, HIV, RPR and CBC today   Assessment and Plan  A: Abnormal uterine bleeding  Bacterial vaginosis Yeast vulvovaginitis  P:  Discharge home Rx for Flagyl and Diflucan given to patient  Warning signs for worsening condition discussed Outpatient pelvic US ordered Patient advised to follow-up with [facility] for results of [place] and management of AUB Patient may return to MAU as needed or if her condition were to change or worsen  Kerry Hough, PA-C 10/28/2016, 8:11 PM

## 2016-10-29 LAB — GC/CHLAMYDIA PROBE AMP (~~LOC~~) NOT AT ARMC
Chlamydia: NEGATIVE
Neisseria Gonorrhea: NEGATIVE

## 2016-10-29 LAB — RPR: RPR: NONREACTIVE

## 2016-10-29 LAB — HIV ANTIBODY (ROUTINE TESTING W REFLEX): HIV Screen 4th Generation wRfx: NONREACTIVE

## 2016-11-05 ENCOUNTER — Other Ambulatory Visit: Payer: Self-pay | Admitting: *Deleted

## 2016-11-05 DIAGNOSIS — N939 Abnormal uterine and vaginal bleeding, unspecified: Secondary | ICD-10-CM

## 2016-11-07 ENCOUNTER — Ambulatory Visit (HOSPITAL_COMMUNITY)
Admission: RE | Admit: 2016-11-07 | Discharge: 2016-11-07 | Disposition: A | Discharge status: Home / Self Care | Payer: Self-pay | Source: Ambulatory Visit | Attending: Medical | Admitting: Medical

## 2016-11-07 DIAGNOSIS — N939 Abnormal uterine and vaginal bleeding, unspecified: Secondary | ICD-10-CM | POA: Insufficient documentation

## 2016-11-07 DIAGNOSIS — D251 Intramural leiomyoma of uterus: Secondary | ICD-10-CM | POA: Insufficient documentation

## 2016-11-27 ENCOUNTER — Encounter: Payer: Self-pay | Admitting: Obstetrics and Gynecology

## 2016-12-18 ENCOUNTER — Encounter: Payer: Self-pay | Admitting: General Practice

## 2016-12-18 ENCOUNTER — Encounter: Payer: Self-pay | Admitting: Obstetrics and Gynecology

## 2016-12-18 NOTE — Progress Notes (Signed)
Patient no showed for appt today. Can reschedule on her own per Dr Elly Modena

## 2017-02-19 ENCOUNTER — Emergency Department (HOSPITAL_COMMUNITY)
Admission: EM | Admit: 2017-02-19 | Discharge: 2017-02-19 | Disposition: A | Discharge status: Home / Self Care | Payer: Self-pay | Attending: Emergency Medicine | Admitting: Emergency Medicine

## 2017-02-19 ENCOUNTER — Encounter (HOSPITAL_COMMUNITY): Payer: Self-pay | Admitting: Emergency Medicine

## 2017-02-19 DIAGNOSIS — R220 Localized swelling, mass and lump, head: Secondary | ICD-10-CM | POA: Insufficient documentation

## 2017-02-19 DIAGNOSIS — Z79899 Other long term (current) drug therapy: Secondary | ICD-10-CM | POA: Insufficient documentation

## 2017-02-19 DIAGNOSIS — F17210 Nicotine dependence, cigarettes, uncomplicated: Secondary | ICD-10-CM | POA: Insufficient documentation

## 2017-02-19 MED ORDER — IBUPROFEN 200 MG PO TABS
600.0000 mg | ORAL_TABLET | Freq: Once | ORAL | Status: AC
  Administered 2017-02-19: 600 mg via ORAL
  Filled 2017-02-19: qty 3

## 2017-02-19 MED ORDER — TRAMADOL HCL 50 MG PO TABS: 50.0000 mg | ORAL_TABLET | Freq: Four times a day (QID) | ORAL | 0 refills | Status: DC | PRN

## 2017-02-19 MED ORDER — PENICILLIN V POTASSIUM 500 MG PO TABS: 500.0000 mg | ORAL_TABLET | Freq: Four times a day (QID) | ORAL | 0 refills | Status: AC

## 2017-02-19 MED ORDER — OXYCODONE-ACETAMINOPHEN 5-325 MG PO TABS
1.0000 | ORAL_TABLET | Freq: Once | ORAL | Status: AC
  Administered 2017-02-19: 1 via ORAL
  Filled 2017-02-19: qty 1

## 2017-02-19 MED ORDER — PENICILLIN V POTASSIUM 500 MG PO TABS
500.0000 mg | ORAL_TABLET | Freq: Once | ORAL | Status: AC
  Administered 2017-02-19: 500 mg via ORAL
  Filled 2017-02-19: qty 1

## 2017-02-19 NOTE — ED Triage Notes (Signed)
Pt states she woke up this morning and her face was swollen  Pt states it has been back and forth doing this for a while but has never been this bad  Pt states she has bad teeth on either side that fillings have came out of

## 2017-02-19 NOTE — Discharge Instructions (Signed)
Take antibiotics until finished. See a dentist. Take ibuprofen 600 mg every 6 hours as needed in addition to the pain medication.

## 2017-03-14 NOTE — ED Provider Notes (Signed)
[facility] DEPT Provider Note   CSN: [id number] Arrival date & time: 02/19/17  8250     History   Chief Complaint Chief Complaint  Patient presents with  . Facial Swelling    HPI Renee Herrera is a 41 y.o. female.  HPI   31yF with L facial swelling and mild pain. Intermittent for months. Previous dental work and missing fillings L molars. No fever or chills. No neck pain.   Past Medical History:  Diagnosis Date  . Depression   . Fibroid   . Hernia, umbilical     There are no active problems to display for this patient.   Past Surgical History:  Procedure Laterality Date  . CESAREAN SECTION    . TUBAL LIGATION      OB History    Gravida Para Term Preterm AB Living   5 4 4   1 4    SAB TAB Ectopic Multiple Live Births   1       4       Home Medications    Prior to Admission medications   Medication Sig Start Date End Date Taking? Authorizing Provider  albuterol (PROVENTIL HFA;VENTOLIN HFA) 108 (90 Base) MCG/ACT inhaler Inhale 2 puffs into the lungs every 6 (six) hours as needed for wheezing or shortness of breath.    [provider]  fluconazole (DIFLUCAN) 150 MG tablet Take 1 tablet (150 mg total) by mouth daily. 10/28/16   Luvenia Redden, PA-C  ibuprofen (ADVIL,MOTRIN) 600 MG tablet Take 1 tablet (600 mg total) by mouth every 6 (six) hours as needed. 10/28/16   Luvenia Redden, PA-C  metroNIDAZOLE (FLAGYL) 500 MG tablet Take 1 tablet (500 mg total) by mouth 2 (two) times daily. 10/28/16   Luvenia Redden, PA-C  traMADol (ULTRAM) 50 MG tablet Take 1 tablet (50 mg total) by mouth every 6 (six) hours as needed. 02/19/17   Virgel Manifold, MD    Family History Family History  Problem Relation Age of Onset  . Mental retardation Father   . Cancer Other   . Hypertension Other     Social History Social History  Substance Use Topics  . Smoking status: Current Every Day Smoker    Packs/day: 1.00    Types: Cigarettes  . Smokeless tobacco: Never Used    . Alcohol use Yes     Comment: occ     Allergies   Patient has no known allergies.   Review of Systems Review of Systems  All systems reviewed and negative, other than as noted in HPI.   Physical Exam Updated Vital Signs BP 125/87 (BP Location: Right Arm)   Pulse 83   Temp 98.1 F (36.7 C) (Oral)   Resp 18   SpO2 100%   Physical Exam  Constitutional: She appears well-developed and well-nourished. No distress.  HENT:  Head: Normocephalic and atraumatic.  Perhaps mild L facial swelling. Missing filling LL molars. Appreciable intraoral swelling. Uvula midline. Normal sounding voice. Neck supple. Submental tissues soft.  Eyes: Conjunctivae are normal. Right eye exhibits no discharge. Left eye exhibits no discharge.  Neck: Neck supple.  Cardiovascular: Normal rate, regular rhythm and normal heart sounds.  Exam reveals no gallop and no friction rub.   No murmur heard. Pulmonary/Chest: Effort normal and breath sounds normal. No respiratory distress.  Abdominal: Soft. She exhibits no distension. There is no tenderness.  Musculoskeletal: She exhibits no edema or tenderness.  Neurological: She is alert.  Skin: Skin is warm and  dry.  Psychiatric: She has a normal mood and affect. Her behavior is normal. Thought content normal.  Nursing note and vitals reviewed.    ED Treatments / Results  Labs (all labs ordered are listed, but only abnormal results are displayed) Labs Reviewed - No data to display  EKG  EKG Interpretation None       Radiology No results found.  Procedures Procedures (including critical care time)  Medications Ordered in ED Medications  penicillin v potassium (VEETID) tablet 500 mg (500 mg Oral Given 02/19/17 0812)  oxyCODONE-acetaminophen (PERCOCET/ROXICET) 5-325 MG per tablet 1 tablet (1 tablet Oral Given 02/19/17 0812)  ibuprofen (ADVIL,MOTRIN) tablet 600 mg (600 mg Oral Given 02/19/17 6226)     Initial Impression / Assessment and Plan / ED  Course  I have reviewed the triage vital signs and the nursing notes.  Pertinent labs & imaging results that were available during my care of the patient were reviewed by me and considered in my medical decision making (see chart for details).     No evidence of serious deep space infection.   Final Clinical Impressions(s) / ED Diagnoses   Final diagnoses:  Facial swelling    New Prescriptions Discharge Medication List as of 02/19/2017  7:34 AM    START taking these medications   Details  penicillin v potassium (VEETID) 500 MG tablet Take 1 tablet (500 mg total) by mouth 4 (four) times daily., Starting Wed 02/19/2017, Until Wed 02/26/2017, Print    traMADol (ULTRAM) 50 MG tablet Take 1 tablet (50 mg total) by mouth every 6 (six) hours as needed., Starting Wed 02/19/2017, Print         Virgel Manifold, MD 03/14/17 [phone]

## 2017-04-19 ENCOUNTER — Encounter (HOSPITAL_COMMUNITY): Payer: Self-pay | Admitting: Emergency Medicine

## 2017-04-19 ENCOUNTER — Emergency Department (HOSPITAL_COMMUNITY)
Admission: EM | Admit: 2017-04-19 | Discharge: 2017-04-19 | Disposition: A | Discharge status: Home / Self Care | Payer: Self-pay | Attending: Emergency Medicine | Admitting: Emergency Medicine

## 2017-04-19 DIAGNOSIS — Z79899 Other long term (current) drug therapy: Secondary | ICD-10-CM | POA: Insufficient documentation

## 2017-04-19 DIAGNOSIS — G8929 Other chronic pain: Secondary | ICD-10-CM | POA: Insufficient documentation

## 2017-04-19 DIAGNOSIS — M545 Low back pain: Secondary | ICD-10-CM | POA: Insufficient documentation

## 2017-04-19 DIAGNOSIS — F17210 Nicotine dependence, cigarettes, uncomplicated: Secondary | ICD-10-CM | POA: Insufficient documentation

## 2017-04-19 MED ORDER — DEXAMETHASONE SODIUM PHOSPHATE 10 MG/ML IJ SOLN
10.0000 mg | Freq: Once | INTRAMUSCULAR | Status: AC
  Administered 2017-04-19: 10 mg via INTRAMUSCULAR
  Filled 2017-04-19: qty 1

## 2017-04-19 NOTE — Discharge Instructions (Signed)
You received a steroid injection for ear pain in the emergency department today. Do not take ibuprofen for the next 3 days. You may take [phone] mg of Tylenol every 6 hours. After 3 days from now if her pain is persisting, you may alternate 600 mg of ibuprofen and [phone] mg of Tylenol every 3 hours as needed for pain. Do not exceed 4000 mg of Tylenol daily. Apply ice or heat to your low back, which ever feels best. Do some gentle stretching and frequent movement. I have attached exercises you can try at home.follow up with a primary care physician for reevaluation of your symptoms especially if they persist. You may need physical therapy in the future. Return to the ED if any concerning signs or symptoms develop such as fevers, weakness, swelling, loss of control of your bowels or bladder.

## 2017-04-19 NOTE — ED Triage Notes (Signed)
Pt c/o back pain ongoing for a while. Pt denies injury.
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
## 2017-04-19 NOTE — ED Notes (Signed)
Declined W/C at D/C and was escorted to lobby by RN. 

## 2017-05-26 ENCOUNTER — Encounter (HOSPITAL_COMMUNITY): Payer: Self-pay | Admitting: Emergency Medicine

## 2017-05-26 DIAGNOSIS — M25561 Pain in right knee: Secondary | ICD-10-CM | POA: Insufficient documentation

## 2017-05-26 DIAGNOSIS — Z5321 Procedure and treatment not carried out due to patient leaving prior to being seen by health care provider: Secondary | ICD-10-CM | POA: Insufficient documentation
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
## 2017-05-27 ENCOUNTER — Emergency Department (HOSPITAL_COMMUNITY)
Admission: EM | Admit: 2017-05-27 | Discharge: 2017-05-27 | Disposition: A | Discharge status: Home / Self Care | Payer: Self-pay | Attending: Emergency Medicine | Admitting: Emergency Medicine

## 2017-05-27 ENCOUNTER — Emergency Department (HOSPITAL_COMMUNITY): Payer: Self-pay

## 2018-03-19 ENCOUNTER — Encounter (HOSPITAL_COMMUNITY): Payer: Self-pay | Admitting: Emergency Medicine

## 2018-03-19 ENCOUNTER — Other Ambulatory Visit: Payer: Self-pay

## 2018-03-19 ENCOUNTER — Emergency Department (HOSPITAL_COMMUNITY)
Admission: EM | Admit: 2018-03-19 | Discharge: 2018-03-19 | Disposition: A | Discharge status: Home / Self Care | Payer: Self-pay | Attending: Emergency Medicine | Admitting: Emergency Medicine

## 2018-03-19 DIAGNOSIS — F329 Major depressive disorder, single episode, unspecified: Secondary | ICD-10-CM | POA: Insufficient documentation

## 2018-03-19 DIAGNOSIS — Z79899 Other long term (current) drug therapy: Secondary | ICD-10-CM | POA: Insufficient documentation

## 2018-03-19 DIAGNOSIS — F17210 Nicotine dependence, cigarettes, uncomplicated: Secondary | ICD-10-CM | POA: Insufficient documentation

## 2018-03-19 DIAGNOSIS — K047 Periapical abscess without sinus: Secondary | ICD-10-CM

## 2018-03-19 MED ORDER — TRAMADOL HCL 50 MG PO TABS: 50.0000 mg | ORAL_TABLET | Freq: Four times a day (QID) | ORAL | 0 refills | Status: DC | PRN

## 2018-03-19 MED ORDER — PENICILLIN V POTASSIUM 500 MG PO TABS: 500.0000 mg | ORAL_TABLET | Freq: Four times a day (QID) | ORAL | 0 refills | Status: DC

## 2018-03-19 MED ORDER — IBUPROFEN 800 MG PO TABS: 800.0000 mg | ORAL_TABLET | Freq: Three times a day (TID) | ORAL | 0 refills | Status: DC | PRN

## 2018-03-19 MED ORDER — BUPIVACAINE HCL (PF) 0.5 % IJ SOLN
10.0000 mL | Freq: Once | INTRAMUSCULAR | Status: AC
Start: 2018-03-19 — End: 2018-03-19
  Administered 2018-03-19: 2 mL

## 2018-03-19 NOTE — Discharge Instructions (Addendum)
Return here as needed.  Follow-up with a dentist.  Rinse With warm water and peroxide 3 times a day.

## 2018-03-19 NOTE — ED Notes (Signed)
ED Provider at bedside. 

## 2018-03-19 NOTE — ED Provider Notes (Signed)
[facility]ERGENC[facility] DEPARTMENT Provider Note   CSN: [id number] Arrival date & time: 03/19/18  1454     History   Chief Complaint Chief Complaint  Patient presents with  . dental abscess    HPI Renee Herrera is a 42 y.o. female.  HPI Patient presents to the emergency department with swelling to the left lower gumline especially around the second molar.  The patient states that this tooth has been broken for quite a while she said difficulty with it in the past.  Patient states that nothing seems to make the condition better or worse.  She states that she did not take any medications prior to arrival.  Patient denies fever, nausea, throat swelling, difficulty swallowing, tongue swelling, weakness, dizziness, headache, blurred vision or syncope. Past Medical History:  Diagnosis Date  . Depression   . Fibroid   . Hernia, umbilical     There are no active problems to display for this patient.   Past Surgical History:  Procedure Laterality Date  . CESAREAN SECTION    . TUBAL LIGATION       OB History    Gravida  5   Para  4   Term  4   Preterm      AB  1   Living  4     SAB  1   TAB      Ectopic      Multiple      Live Births  4            Home Medications    Prior to Admission medications   Medication Sig Start Date End Date Taking? Authorizing Provider  albuterol (PROVENTIL HFA;VENTOLIN HFA) 108 (90 Base) MCG/ACT inhaler Inhale 2 puffs into the lungs every 6 (six) hours as needed for wheezing or shortness of breath.    [provider]  fluconazole (DIFLUCAN) 150 MG tablet Take 1 tablet (150 mg total) by mouth daily. 10/28/16   Luvenia Redden, PA-C  ibuprofen (ADVIL,MOTRIN) 600 MG tablet Take 1 tablet (600 mg total) by mouth every 6 (six) hours as needed. 10/28/16   Luvenia Redden, PA-C  metroNIDAZOLE (FLAGYL) 500 MG tablet Take 1 tablet (500 mg total) by mouth 2 (two) times daily. 10/28/16   Luvenia Redden, PA-C    traMADol (ULTRAM) 50 MG tablet Take 1 tablet (50 mg total) by mouth every 6 (six) hours as needed. 02/19/17   Virgel Manifold, MD  metoCLOPramide (REGLAN) 10 MG tablet Take 1 tablet (10 mg total) by mouth every 6 (six) hours. 12/20/13 06/27/14  Elnora Morrison, MD    Family History Family History  Problem Relation Age of Onset  . Mental retardation Father   . Cancer Other   . Hypertension Other     Social History Social History   Tobacco Use  . Smoking status: Current Every Day Smoker    Packs/day: 1.00    Types: Cigarettes  . Smokeless tobacco: Never Used  Substance Use Topics  . Alcohol use: Yes    Comment: occ  . Drug use: Yes    Types: Marijuana     Allergies   Patient has no known allergies.   Review of Systems Review of Systems  All other systems negative except as documented in the HPI. All pertinent positives and negatives as reviewed in the HPI.  Physical Exam Updated Vital Signs BP (!) 108/91 (BP Location: Right Arm)   Pulse 82   Temp 98.4 F (  36.9 C) (Oral)   Resp 16   SpO2 100%   Physical Exam  Constitutional: She is oriented to person, place, and time. She appears well-developed and well-nourished. No distress.  HENT:  Head: Normocephalic and atraumatic.  Mouth/Throat:    Eyes: Pupils are equal, round, and reactive to light.  Pulmonary/Chest: Effort normal.  Neurological: She is alert and oriented to person, place, and time.  Skin: Skin is warm and dry.  Psychiatric: She has a normal mood and affect.  Nursing note and vitals reviewed.    ED Treatments / Results  Labs (all labs ordered are listed, but only abnormal results are displayed) Labs Reviewed - No data to display  EKG None  Radiology No results found.  Procedures Dental Block Date/Time: 03/19/2018 5:07 PM Performed by: Dalia Heading, PA-C Authorized by: Dalia Heading, PA-C   Consent:    Consent obtained:  Verbal   Consent given by:  Patient Indications:     Indications: dental abscess   Location:    Block type:  Inferior alveolar Procedure details (see MAR for exact dosages):    Needle gauge:  27 G   Anesthetic injected:  Bupivacaine 0.5% w/o epi Post-procedure details:    Outcome:  Anesthesia achieved   Patient tolerance of procedure:  Tolerated well, no immediate complications   (including critical care time)  Medications Ordered in ED Medications  bupivacaine (MARCAINE) 0.5 % injection 10 mL (has no administration in time range)     Initial Impression / Assessment and Plan / ED Course  I have reviewed the triage vital signs and the nursing notes.  Pertinent labs & imaging results that were available during my care of the patient were reviewed by me and considered in my medical decision making (see chart for details).    Patient will be treated for dental infection.  Told to return here as needed.  Advised to rinse with warm water peroxide.   Final Clinical Impressions(s) / ED Diagnoses   Final diagnoses:  None    ED Discharge Orders    None       Dalia Heading, PA-C 03/19/18 1709    Daleen Bo, MD 03/20/18 [phone]

## 2018-03-19 NOTE — ED Triage Notes (Signed)
Pt presents with left lower gum and jaw swollen. She reports the swelling comes and gone.

## 2018-04-26 ENCOUNTER — Ambulatory Visit (HOSPITAL_COMMUNITY)
Admission: EM | Admit: 2018-04-26 | Discharge: 2018-04-26 | Disposition: A | Discharge status: Home / Self Care | Payer: Self-pay | Attending: Family Medicine | Admitting: Family Medicine

## 2018-04-26 ENCOUNTER — Encounter (HOSPITAL_COMMUNITY): Payer: Self-pay | Admitting: Family Medicine

## 2018-04-26 DIAGNOSIS — G43009 Migraine without aura, not intractable, without status migrainosus: Secondary | ICD-10-CM

## 2018-04-26 MED ORDER — PENICILLIN V POTASSIUM 500 MG PO TABS: 500.0000 mg | ORAL_TABLET | Freq: Four times a day (QID) | ORAL | 0 refills | Status: DC

## 2018-04-26 MED ORDER — TOPIRAMATE 50 MG PO TABS: 50.0000 mg | ORAL_TABLET | Freq: Every day | ORAL | 3 refills | Status: DC

## 2018-04-26 NOTE — ED Provider Notes (Signed)
[facility]    CSN: [id number] Arrival date & time: 04/26/18  1325     History   Chief Complaint No chief complaint on file.   HPI Renee Herrera is a 42 y.o. female.   This is a 42 year old woman who comes in for the first time in over 3 years to the [place] urgent care center.  She is complaining of what she feels is a dental abscess and also has a headache.  Patient has 3-4 headaches a month.  She has chronic problems with her teeth and the right lower first molar is swollen and red and painful.  She has had this headache for several days.  She works at [employer].  This is typical for 1 of her migraine headaches.  She does not like to take medicine, and yet she is here.     Past Medical History:  Diagnosis Date  . Depression   . Fibroid   . Hernia, umbilical     There are no active problems to display for this patient.   Past Surgical History:  Procedure Laterality Date  . CESAREAN SECTION    . TUBAL LIGATION      OB History    Gravida  5   Para  4   Term  4   Preterm      AB  1   Living  4     SAB  1   TAB      Ectopic      Multiple      Live Births  4            Home Medications    Prior to Admission medications   Medication Sig Start Date End Date Taking? Authorizing Provider  albuterol (PROVENTIL HFA;VENTOLIN HFA) 108 (90 Base) MCG/ACT inhaler Inhale 2 puffs into the lungs every 6 (six) hours as needed for wheezing or shortness of breath.    [provider]  fluconazole (DIFLUCAN) 150 MG tablet Take 1 tablet (150 mg total) by mouth daily. 10/28/16   Luvenia Redden, PA-C  penicillin v potassium (VEETID) 500 MG tablet Take 1 tablet (500 mg total) by mouth 4 (four) times daily. 04/26/18   Robyn Haber, MD  topiramate (TOPAMAX) 50 MG tablet Take 1 tablet (50 mg total) by mouth at bedtime. 04/26/18   Robyn Haber, MD    Family History Family History  Problem Relation Age of Onset    . Mental retardation Father   . Cancer Other   . Hypertension Other     Social History Social History   Tobacco Use  . Smoking status: Current Every Day Smoker    Packs/day: 1.00    Types: Cigarettes  . Smokeless tobacco: Never Used  Substance Use Topics  . Alcohol use: Yes    Comment: occ  . Drug use: Yes    Types: Marijuana     Allergies   Patient has no known allergies.   Review of Systems Review of Systems   Physical Exam Triage Vital Signs ED Triage Vitals  Enc Vitals Group     BP      Pulse      Resp      Temp      Temp src      SpO2      Weight      Height      Head Circumference      Peak Flow  Pain Score      Pain Loc      Pain Edu?      Excl. in [place]?    No data found.  Updated Vital Signs BP 126/80 (BP Location: Right Arm)   Pulse 71   Temp 97.9 F (36.6 C) (Oral)   Resp 16   SpO2 100%    Physical Exam  Constitutional: She is oriented to person, place, and time. She appears well-developed and well-nourished.  HENT:  Right Ear: External ear normal.  Left Ear: External ear normal.  Swollen right lower jaw corresponding to exposed root upper broken tooth #29  Eyes: Pupils are equal, round, and reactive to light. Conjunctivae are normal.  Neck: Normal range of motion. Neck supple.  Pulmonary/Chest: Effort normal.  Musculoskeletal: Normal range of motion.  Neurological: She is alert and oriented to person, place, and time. No cranial nerve deficit.  Skin: Skin is warm and dry.  Psychiatric: She has a normal mood and affect. Her behavior is normal. Thought content normal.  Nursing note and vitals reviewed.    UC Treatments / Results  Labs (all labs ordered are listed, but only abnormal results are displayed) Labs Reviewed - No data to display  EKG None  Radiology No results found.  Procedures Procedures (including critical care time)  Medications Ordered in UC Medications - No data to display  Initial Impression /  Assessment and Plan / UC Course  I have reviewed the triage vital signs and the nursing notes.  Pertinent labs & imaging results that were available during my care of the patient were reviewed by me and considered in my medical decision making (see chart for details).    Final Clinical Impressions(s) / UC Diagnoses   Final diagnoses:  Migraine without aura and without status migrainosus, not intractable   Discharge Instructions   None    ED Prescriptions    Medication Sig Dispense Auth. Provider   penicillin v potassium (VEETID) 500 MG tablet Take 1 tablet (500 mg total) by mouth 4 (four) times daily. 28 tablet Robyn Haber, MD   topiramate (TOPAMAX) 50 MG tablet Take 1 tablet (50 mg total) by mouth at bedtime. 30 tablet Robyn Haber, MD     Controlled Substance Prescriptions [place] Controlled Substance Registry consulted? Not Applicable   Robyn Haber, MD 04/26/18 [phone]

## 2018-06-27 ENCOUNTER — Emergency Department (HOSPITAL_COMMUNITY)
Admission: EM | Admit: 2018-06-27 | Discharge: 2018-06-27 | Disposition: A | Discharge status: Home / Self Care | Payer: Medicaid Other | Attending: Emergency Medicine | Admitting: Emergency Medicine

## 2018-06-27 ENCOUNTER — Encounter (HOSPITAL_COMMUNITY): Payer: Self-pay

## 2018-06-27 DIAGNOSIS — F17210 Nicotine dependence, cigarettes, uncomplicated: Secondary | ICD-10-CM | POA: Insufficient documentation

## 2018-06-27 DIAGNOSIS — K047 Periapical abscess without sinus: Secondary | ICD-10-CM | POA: Diagnosis not present

## 2018-06-27 DIAGNOSIS — Z79899 Other long term (current) drug therapy: Secondary | ICD-10-CM | POA: Insufficient documentation

## 2018-06-27 DIAGNOSIS — K0889 Other specified disorders of teeth and supporting structures: Secondary | ICD-10-CM | POA: Diagnosis present

## 2018-06-27 MED ORDER — IBUPROFEN 800 MG PO TABS: 800.0000 mg | ORAL_TABLET | Freq: Three times a day (TID) | ORAL | 0 refills | Status: DC | PRN

## 2018-06-27 MED ORDER — CLINDAMYCIN HCL 150 MG PO CAPS
450.0000 mg | ORAL_CAPSULE | Freq: Once | ORAL | Status: AC
  Administered 2018-06-27: 450 mg via ORAL
  Filled 2018-06-27: qty 3

## 2018-06-27 MED ORDER — CLINDAMYCIN HCL 150 MG PO CAPS: 450.0000 mg | ORAL_CAPSULE | Freq: Three times a day (TID) | ORAL | 0 refills | Status: AC

## 2018-06-27 MED ORDER — IBUPROFEN 800 MG PO TABS
800.0000 mg | ORAL_TABLET | Freq: Once | ORAL | Status: AC
  Administered 2018-06-27: 800 mg via ORAL
  Filled 2018-06-27: qty 1

## 2018-06-27 NOTE — ED Triage Notes (Signed)
Dental pian, broke tooth, right lower molar.

## 2018-06-27 NOTE — ED Provider Notes (Signed)
[facility] EMERGENCY DEPARTMENT Provider Note   CSN: [id number] Arrival date & time: 06/27/18  1554     History   Chief Complaint Chief Complaint  Patient presents with  . Dental Pain    HPI Renee Herrera is a 43 y.o. female.  The history is provided by the patient. No language interpreter was used.  Dental Pain     Renee Herrera is a 43 y.o. female who presents to the Emergency Department complaining of dental pain. She reports pain in her right lower jaw that began yesterday. She has throbbing sensation that spread throughout her face with associated swelling. She has similar symptoms in the past related to dental infections. She took some of her penicillin that she had at home with no improvement in symptoms. She denies any fevers, difficulty breathing, difficulty swallowing. She has no medical problems. She does not have a dentist. Symptoms are moderate, constant, worsening. Past Medical History:  Diagnosis Date  . Depression   . Fibroid   . Hernia, umbilical     There are no active problems to display for this patient.   Past Surgical History:  Procedure Laterality Date  . CESAREAN SECTION    . TUBAL LIGATION       OB History    Gravida  5   Para  4   Term  4   Preterm      AB  1   Living  4     SAB  1   TAB      Ectopic      Multiple      Live Births  4            Home Medications    Prior to Admission medications   Medication Sig Start Date End Date Taking? Authorizing Provider  albuterol (PROVENTIL HFA;VENTOLIN HFA) 108 (90 Base) MCG/ACT inhaler Inhale 2 puffs into the lungs every 6 (six) hours as needed for wheezing or shortness of breath.    [provider]  clindamycin (CLEOCIN) 150 MG capsule Take 3 capsules (450 mg total) by mouth 3 (three) times daily for 7 days. 06/27/18 07/04/18  Quintella Reichert, MD  fluconazole (DIFLUCAN) 150 MG tablet Take 1 tablet (150 mg total) by mouth daily. 10/28/16   Luvenia Redden, PA-C  ibuprofen (ADVIL,MOTRIN) 800 MG tablet Take 1 tablet (800 mg total) by mouth every 8 (eight) hours as needed. 06/27/18   Quintella Reichert, MD  penicillin v potassium (VEETID) 500 MG tablet Take 1 tablet (500 mg total) by mouth 4 (four) times daily. 04/26/18   Robyn Haber, MD  topiramate (TOPAMAX) 50 MG tablet Take 1 tablet (50 mg total) by mouth at bedtime. 04/26/18   Robyn Haber, MD    Family History Family History  Problem Relation Age of Onset  . Mental retardation Father   . Cancer Other   . Hypertension Other     Social History Social History   Tobacco Use  . Smoking status: Current Every Day Smoker    Packs/day: 1.00    Types: Cigarettes  . Smokeless tobacco: Never Used  Substance Use Topics  . Alcohol use: Yes    Comment: occ  . Drug use: Yes    Types: Marijuana     Allergies   Patient has no known allergies.   Review of Systems Review of Systems  All other systems reviewed and are negative.    Physical Exam Updated Vital Signs BP 119/82 (BP  Location: Left Arm)   Pulse 71   Temp 98.1 F (36.7 C) (Oral)   Resp 16   SpO2 100%   Physical Exam Vitals signs and nursing note reviewed.  Constitutional:      Appearance: Normal appearance.  HENT:     Head: Normocephalic and atraumatic.     Nose: Nose normal.     Mouth/Throat:     Mouth: Mucous membranes are moist.     Comments: 231 is fractured at the gum line. There is local gingival erythema, induration and focal tenderness at the base of the tooth. There is no focal fluctuance or drainage. No swelling in the posterior oropharynx or submental region. Neck:     Musculoskeletal: Neck supple.  Cardiovascular:     Rate and Rhythm: Normal rate and regular rhythm.  Pulmonary:     Effort: Pulmonary effort is normal. No respiratory distress.  Musculoskeletal:        General: No swelling.  Skin:    General: Skin is warm and dry.     Capillary Refill: Capillary refill takes less than 2  seconds.  Neurological:     Mental Status: She is alert and oriented to person, place, and time.     Comments: No asymmetry of facial muscles  Psychiatric:        Mood and Affect: Mood normal.        Behavior: Behavior normal.      ED Treatments / Results  Labs (all labs ordered are listed, but only abnormal results are displayed) Labs Reviewed - No data to display  EKG None  Radiology No results found.  Procedures Dental Block Date/Time: 06/27/2018 7:06 PM Performed by: Quintella Reichert, MD Authorized by: Quintella Reichert, MD   Consent:    Consent obtained:  Verbal   Consent given by:  Patient   Risks discussed:  Infection, hematoma, pain, nerve damage, swelling and unsuccessful block Indications:    Indications: dental abscess   Location:    Block type:  Inferior alveolar   Laterality:  Right Procedure details (see MAR for exact dosages):    Topical anesthetic:  Benzocaine gel   Anesthetic injected:  Bupivacaine 0.5% WITH epi   Injection procedure:  Anatomic landmarks identified, introduced needle and anatomic landmarks palpated Post-procedure details:    Outcome:  Anesthesia achieved   Patient tolerance of procedure:  Tolerated well, no immediate complications   (including critical care time)  Medications Ordered in ED Medications  clindamycin (CLEOCIN) capsule 450 mg (has no administration in time range)  ibuprofen (ADVIL,MOTRIN) tablet 800 mg (has no administration in time range)     Initial Impression / Assessment and Plan / ED Course  I have reviewed the triage vital signs and the nursing notes.  Pertinent labs & imaging results that were available during my care of the patient were reviewed by me and considered in my medical decision making (see chart for details).     Patient here for evaluation of dental pain and swelling. She is non-toxic appearing on evaluation and in no acute distress. Dental block was performed for comfort. There is no drainable  abscess on examination. Given patient has been taking penicillin at home with no improvement in symptoms will change over to clindamycin. Discussed importance of dentistry follow-up as well as home care and return precautions.  Final Clinical Impressions(s) / ED Diagnoses   Final diagnoses:  Dental abscess    ED Discharge Orders         Ordered  clindamycin (CLEOCIN) 150 MG capsule  3 times daily     06/27/18 1858    ibuprofen (ADVIL,MOTRIN) 800 MG tablet  Every 8 hours PRN     06/27/18 1858           Quintella Reichert, MD 06/27/18 1907

## 2018-07-22 ENCOUNTER — Emergency Department (HOSPITAL_COMMUNITY): Payer: Medicaid Other

## 2018-07-22 ENCOUNTER — Other Ambulatory Visit: Payer: Self-pay

## 2018-07-22 ENCOUNTER — Encounter (HOSPITAL_COMMUNITY): Payer: Self-pay | Admitting: Emergency Medicine

## 2018-07-22 ENCOUNTER — Emergency Department (HOSPITAL_COMMUNITY)
Admission: EM | Admit: 2018-07-22 | Discharge: 2018-07-22 | Disposition: A | Discharge status: Home / Self Care | Payer: Medicaid Other | Attending: Emergency Medicine | Admitting: Emergency Medicine

## 2018-07-22 DIAGNOSIS — R05 Cough: Secondary | ICD-10-CM | POA: Diagnosis present

## 2018-07-22 DIAGNOSIS — J40 Bronchitis, not specified as acute or chronic: Secondary | ICD-10-CM

## 2018-07-22 DIAGNOSIS — J209 Acute bronchitis, unspecified: Secondary | ICD-10-CM | POA: Insufficient documentation

## 2018-07-22 DIAGNOSIS — J069 Acute upper respiratory infection, unspecified: Secondary | ICD-10-CM | POA: Insufficient documentation

## 2018-07-22 DIAGNOSIS — Z79899 Other long term (current) drug therapy: Secondary | ICD-10-CM | POA: Insufficient documentation

## 2018-07-22 DIAGNOSIS — F17210 Nicotine dependence, cigarettes, uncomplicated: Secondary | ICD-10-CM | POA: Diagnosis not present

## 2018-07-22 MED ORDER — PREDNISONE 10 MG PO TABS: 50.0000 mg | ORAL_TABLET | Freq: Every day | ORAL | 0 refills | Status: AC

## 2018-07-22 MED ORDER — BENZONATATE 200 MG PO CAPS: 200.0000 mg | ORAL_CAPSULE | Freq: Three times a day (TID) | ORAL | 0 refills | Status: AC

## 2018-07-22 MED ORDER — ALBUTEROL SULFATE HFA 108 (90 BASE) MCG/ACT IN AERS: 2.0000 | INHALATION_SPRAY | Freq: Four times a day (QID) | RESPIRATORY_TRACT | 0 refills | Status: DC | PRN

## 2018-07-22 NOTE — ED Provider Notes (Signed)
[facility] Provider Note   CSN: [id number] Arrival date & time: 07/22/18  1211     History   Chief Complaint Chief Complaint  Patient presents with  . URI    HPI Renee Herrera is a 42 y.o. female.  43 year old female presents with complaint of cough and congestion x4 days.  Patient denies fevers, chills, body aches.  Patient is a daily smoker, denies history of chronic lung disease.  No other complaints or concerns.     Past Medical History:  Diagnosis Date  . Depression   . Fibroid   . Hernia, umbilical     There are no active problems to display for this patient.   Past Surgical History:  Procedure Laterality Date  . CESAREAN SECTION    . TUBAL LIGATION       OB History    Gravida  5   Para  4   Term  4   Preterm      AB  1   Living  4     SAB  1   TAB      Ectopic      Multiple      Live Births  4            Home Medications    Prior to Admission medications   Medication Sig Start Date End Date Taking? Authorizing Provider  albuterol (PROVENTIL HFA;VENTOLIN HFA) 108 (90 Base) MCG/ACT inhaler Inhale 2 puffs into the lungs every 6 (six) hours as needed for wheezing or shortness of breath. 07/22/18   Tacy Learn, PA-C  benzonatate (TESSALON) 200 MG capsule Take 1 capsule (200 mg total) by mouth every 8 (eight) hours for 10 days. 07/22/18 08/01/18  Tacy Learn, PA-C  fluconazole (DIFLUCAN) 150 MG tablet Take 1 tablet (150 mg total) by mouth daily. 10/28/16   Luvenia Redden, PA-C  ibuprofen (ADVIL,MOTRIN) 800 MG tablet Take 1 tablet (800 mg total) by mouth every 8 (eight) hours as needed. 06/27/18   Quintella Reichert, MD  penicillin v potassium (VEETID) 500 MG tablet Take 1 tablet (500 mg total) by mouth 4 (four) times daily. 04/26/18   Robyn Haber, MD  predniSONE (DELTASONE) 10 MG tablet Take 5 tablets (50 mg total) by mouth daily for 5 days. 07/22/18 07/27/18  Tacy Learn, PA-C  topiramate  (TOPAMAX) 50 MG tablet Take 1 tablet (50 mg total) by mouth at bedtime. 04/26/18   Robyn Haber, MD    Family History Family History  Problem Relation Age of Onset  . Mental retardation Father   . Cancer Other   . Hypertension Other     Social History Social History   Tobacco Use  . Smoking status: Current Every Day Smoker    Packs/day: 1.00    Types: Cigarettes  . Smokeless tobacco: Never Used  Substance Use Topics  . Alcohol use: Yes    Comment: occ  . Drug use: Yes    Types: Marijuana     Allergies   Patient has no known allergies.   Review of Systems Review of Systems  Constitutional: Negative for chills and fever.  HENT: Positive for congestion and postnasal drip. Negative for ear pain, rhinorrhea, sinus pressure, sinus pain, sneezing, sore throat, trouble swallowing and voice change.   Eyes: Negative for discharge and redness.  Respiratory: Positive for cough. Negative for shortness of breath and wheezing.   Musculoskeletal: Negative for arthralgias and myalgias.  Skin: Negative for  rash and wound.  Allergic/Immunologic: Negative for immunocompromised state.  Neurological: Negative for weakness and headaches.  Hematological: Negative for adenopathy.  All other systems reviewed and are negative.    Physical Exam Updated Vital Signs BP 116/78 (BP Location: Right Arm)   Pulse 73   Temp 98.5 F (36.9 C) (Oral)   Resp 16   SpO2 97%   Physical Exam Vitals signs and nursing note reviewed.  Constitutional:      General: She is not in acute distress.    Appearance: She is well-developed. She is not diaphoretic.  HENT:     Head: Normocephalic and atraumatic.     Nose: Mucosal edema and congestion present.     Mouth/Throat:     Mouth: Mucous membranes are moist.  Eyes:     Conjunctiva/sclera: Conjunctivae normal.  Neck:     Musculoskeletal: Neck supple.  Cardiovascular:     Rate and Rhythm: Normal rate and regular rhythm.     Pulses: Normal pulses.      Heart sounds: Normal heart sounds.  Pulmonary:     Effort: Pulmonary effort is normal.     Breath sounds: Wheezing and rales present.  Lymphadenopathy:     Cervical: No cervical adenopathy.  Skin:    General: Skin is warm and dry.     Findings: No rash.  Neurological:     Mental Status: She is alert and oriented to person, place, and time.  Psychiatric:        Behavior: Behavior normal.      ED Treatments / Results  Labs (all labs ordered are listed, but only abnormal results are displayed) Labs Reviewed - No data to display  EKG None  Radiology Dg Chest 2 View  Result Date: 07/22/2018 CLINICAL DATA:  Productive cough since Saturday. EXAM: CHEST - 2 VIEW COMPARISON:  Chest x-ray dated September 19, 2015. FINDINGS: The heart remains at the upper limits of normal in size. Coarsened interstitial markings are likely smoking-related. No focal consolidation, pleural effusion, or pneumothorax. No acute osseous abnormality. IMPRESSION: No active cardiopulmonary disease. Electronically Signed   By: Titus Dubin M.D.   On: 07/22/2018 17:18    Procedures Procedures (including critical care time)  Medications Ordered in ED Medications - No data to display   Initial Impression / Assessment and Plan / ED Course  I have reviewed the triage vital signs and the nursing notes.  Pertinent labs & imaging results that were available during my care of the patient were reviewed by me and considered in my medical decision making (see chart for details).  Clinical Course as of Jul 22 1754  Wed Jul 22, 4028  847 43 year old female with complaint of cough and congestion x4 days.  Patient is a daily smoker, has wheezing and coarse lung sounds.  Chest x-ray is negative for pneumonia.  Suspect COPD versus bronchitis with URI.  Patient given prednisone for 5-day course with Tessalon and inhaler.  Recommend OTC medications like Flonase and Zyrtec, can also take Sudafed and advised to obtain this  from the pharmacist.  Patient should recheck with her PCP.   [LM]    Clinical Course User Index [LM] Tacy Learn, PA-C   Final Clinical Impressions(s) / ED Diagnoses   Final diagnoses:  Viral upper respiratory tract infection  Bronchitis    ED Discharge Orders         Ordered    predniSONE (DELTASONE) 10 MG tablet  Daily     07/22/18 1734  benzonatate (TESSALON) 200 MG capsule  Every 8 hours     07/22/18 1734    albuterol (PROVENTIL HFA;VENTOLIN HFA) 108 (90 Base) MCG/ACT inhaler  Every 6 hours PRN     07/22/18 1734           Tacy Learn, PA-C 07/22/18 1756    Quintella Reichert, MD 07/22/18 1907

## 2018-07-22 NOTE — ED Triage Notes (Signed)
Pt with URI symptoms and lower back pain for a few days. Denies fevers, N/V/D. NAD at triage.

## 2018-07-22 NOTE — Discharge Instructions (Addendum)
Take prednisone and complete the full course. Take Tessalon as needed as prescribed for cough.  Use inhaler as needed. Take Flonase and Zyrtec daily.  You may use Sudafed as needed as directed for congestion, asked the pharmacist for this medication. Recheck with your primary care provider, return to ER for new or worsening symptoms.

## 2019-09-13 ENCOUNTER — Other Ambulatory Visit: Payer: Self-pay

## 2019-09-13 ENCOUNTER — Encounter (HOSPITAL_COMMUNITY): Payer: Self-pay

## 2019-09-13 DIAGNOSIS — H1032 Unspecified acute conjunctivitis, left eye: Secondary | ICD-10-CM | POA: Insufficient documentation

## 2019-09-13 DIAGNOSIS — J029 Acute pharyngitis, unspecified: Secondary | ICD-10-CM | POA: Insufficient documentation

## 2019-09-13 NOTE — ED Triage Notes (Signed)
Pt to er, pt c/o eye pain, states that she woke up from a nap and had some L eye pain.  Pt states that her vision is ok, but it feels like something is in her eye.  Denies contact lenses.

## 2019-09-14 ENCOUNTER — Emergency Department (HOSPITAL_COMMUNITY)
Admission: EM | Admit: 2019-09-14 | Discharge: 2019-09-14 | Disposition: A | Discharge status: Home / Self Care | Payer: Medicaid Other | Attending: Emergency Medicine | Admitting: Emergency Medicine

## 2019-09-14 DIAGNOSIS — H109 Unspecified conjunctivitis: Secondary | ICD-10-CM

## 2019-09-14 MED ORDER — TETRACAINE HCL 0.5 % OP SOLN
2.0000 [drp] | Freq: Once | OPHTHALMIC | Status: AC
  Administered 2019-09-14: 2 [drp] via OPHTHALMIC
  Filled 2019-09-14: qty 4

## 2019-09-14 MED ORDER — FLUORESCEIN SODIUM 1 MG OP STRP
2.0000 | ORAL_STRIP | Freq: Once | OPHTHALMIC | Status: AC
  Administered 2019-09-14: 2 via OPHTHALMIC
  Filled 2019-09-14: qty 2

## 2019-09-14 MED ORDER — POLYMYXIN B-TRIMETHOPRIM 10000-0.1 UNIT/ML-% OP SOLN
1.0000 [drp] | OPHTHALMIC | Status: DC
  Administered 2019-09-14: 1 [drp] via OPHTHALMIC
  Filled 2019-09-14: qty 10

## 2019-09-14 NOTE — ED Notes (Signed)
Pt ambulatory to waiting room. Pt verbalized understanding of discharge instructions.   

## 2019-09-14 NOTE — Discharge Instructions (Signed)
Use the drops 4 times a day for 5 days.  Take an over-the-counter allergy pill such as Claritin every day.

## 2019-09-14 NOTE — ED Provider Notes (Signed)
[facility] Provider Note   CSN: [id number] Arrival date & time: 09/13/19  2205     History Chief Complaint  Patient presents with  . Eye Pain    Renee Herrera is a 44 y.o. female.  Patient presents to the ER for evaluation of left eye irritation.  Patient reports that she took a nap this evening and when she woke up she felt like the eye was very irritated and there was a sensation of something being in the eye.  No change in vision.  No eye pain.  Patient also reports that she has had a scratchy, sore throat for the last couple of days.  No fever, no cough.        Past Medical History:  Diagnosis Date  . Depression   . Fibroid   . Hernia, umbilical     There are no problems to display for this patient.   Past Surgical History:  Procedure Laterality Date  . CESAREAN SECTION    . TUBAL LIGATION       OB History    Gravida  5   Para  4   Term  4   Preterm      AB  1   Living  4     SAB  1   TAB      Ectopic      Multiple      Live Births  4           Family History  Problem Relation Age of Onset  . Mental retardation Father   . Cancer Other   . Hypertension Other     Social History   Tobacco Use  . Smoking status: Current Every Day Smoker    Packs/day: 0.50    Types: Cigarettes  . Smokeless tobacco: Never Used  Substance Use Topics  . Alcohol use: Yes  . Drug use: Yes    Types: Marijuana    Home Medications Prior to Admission medications   Medication Sig Start Date End Date Taking? Authorizing Provider  albuterol (PROVENTIL HFA;VENTOLIN HFA) 108 (90 Base) MCG/ACT inhaler Inhale 2 puffs into the lungs every 6 (six) hours as needed for wheezing or shortness of breath. 07/22/18   Tacy Learn, PA-C  fluconazole (DIFLUCAN) 150 MG tablet Take 1 tablet (150 mg total) by mouth daily. 10/28/16   Luvenia Redden, PA-C  ibuprofen (ADVIL,MOTRIN) 800 MG tablet Take 1 tablet (800 mg total) by mouth every 8 (eight)  hours as needed. 06/27/18   Quintella Reichert, MD  penicillin v potassium (VEETID) 500 MG tablet Take 1 tablet (500 mg total) by mouth 4 (four) times daily. 04/26/18   Robyn Haber, MD  topiramate (TOPAMAX) 50 MG tablet Take 1 tablet (50 mg total) by mouth at bedtime. 04/26/18   Robyn Haber, MD    Allergies    Patient has no known allergies.  Review of Systems   Review of Systems  HENT: Positive for sore throat.   Eyes: Positive for redness and itching.  All other systems reviewed and are negative.   Physical Exam Updated Vital Signs BP 118/74 (BP Location: Right Arm)   Pulse 80   Temp 98.3 F (36.8 C) (Oral)   Resp 16   Ht 5\' 6"  (1.676 m)   Wt 65.8 kg   SpO2 100%   BMI 23.40 kg/m   Physical Exam Vitals and nursing note reviewed.  Constitutional:      General: She is not  in acute distress.    Appearance: Normal appearance. She is well-developed.  HENT:     Head: Normocephalic and atraumatic.     Right Ear: Hearing normal.     Left Ear: Hearing normal.     Nose: Nose normal.     Mouth/Throat:     Pharynx: No pharyngeal swelling, oropharyngeal exudate, posterior oropharyngeal erythema or uvula swelling.     Tonsils: No tonsillar exudate or tonsillar abscesses.  Eyes:     General: Lids are normal. Lids are everted, no foreign bodies appreciated. Vision grossly intact. Gaze aligned appropriately.        Left eye: No foreign body, discharge or hordeolum.     Intraocular pressure: Left eye pressure is 24 mmHg. Measurements were taken using a handheld tonometer.    Extraocular Movements: Extraocular movements intact.     Conjunctiva/sclera:     Left eye: Left conjunctiva is injected. No exudate or hemorrhage.    Pupils: Pupils are equal, round, and reactive to light.     Left eye: No corneal abrasion or fluorescein uptake.  Cardiovascular:     Rate and Rhythm: Regular rhythm.     Heart sounds: S1 normal and S2 normal. No murmur. No friction rub. No gallop.     Pulmonary:     Effort: Pulmonary effort is normal. No respiratory distress.     Breath sounds: Normal breath sounds.  Chest:     Chest wall: No tenderness.  Abdominal:     General: Bowel sounds are normal.     Palpations: Abdomen is soft.     Tenderness: There is no abdominal tenderness. There is no guarding or rebound. Negative signs include Murphy's sign and McBurney's sign.     Hernia: No hernia is present.  Musculoskeletal:        General: Normal range of motion.     Cervical back: Normal range of motion and neck supple.  Skin:    General: Skin is warm and dry.     Findings: No rash.  Neurological:     Mental Status: She is alert and oriented to person, place, and time.     GCS: GCS eye subscore is 4. GCS verbal subscore is 5. GCS motor subscore is 6.     Cranial Nerves: No cranial nerve deficit.     Sensory: No sensory deficit.     Coordination: Coordination normal.  Psychiatric:        Speech: Speech normal.        Behavior: Behavior normal.        Thought Content: Thought content normal.     ED Results / Procedures / Treatments   Labs (all labs ordered are listed, but only abnormal results are displayed) Labs Reviewed - No data to display  EKG None  Radiology No results found.  Procedures Procedures (including critical care time)  Medications Ordered in ED Medications  tetracaine (PONTOCAINE) 0.5 % ophthalmic solution 2 drop (has no administration in time range)  fluorescein ophthalmic strip 2 strip (has no administration in time range)  trimethoprim-polymyxin b (POLYTRIM) ophthalmic solution 1 drop (has no administration in time range)    ED Course  I have reviewed the triage vital signs and the nursing notes.  Pertinent labs & imaging results that were available during my care of the patient were reviewed by me and considered in my medical decision making (see chart for details).    MDM Rules/Calculators/A&P  Patient presents  to the emergency department for evaluation of eye discomfort.  Patient awakened from a nap and feels like there is something in the left eye.  Examination does not reveal any foreign body.  There is no fluorescein uptake to suggest ulcer or abrasion of the cornea.  Intraocular pressure is just very slightly elevated but she had significant difficulty with the exam and I believe some of this was her tensing against the tonometer.  This does not appear to be acute glaucoma.  Patient with sore throat, possibly viral etiology for all symptoms, possible allergic.  Final Clinical Impression(s) / ED Diagnoses Final diagnoses:  Conjunctivitis of left eye, unspecified conjunctivitis type    Rx / DC Orders ED Discharge Orders    None       Bruce Mayers, Gwenyth Allegra, MD 09/14/19 [phone]

## 2019-09-29 ENCOUNTER — Encounter (HOSPITAL_COMMUNITY): Payer: Self-pay | Admitting: *Deleted

## 2019-09-29 ENCOUNTER — Emergency Department (HOSPITAL_COMMUNITY)
Admission: EM | Admit: 2019-09-29 | Discharge: 2019-09-29 | Disposition: A | Discharge status: Home / Self Care | Payer: Self-pay | Attending: Emergency Medicine | Admitting: Emergency Medicine

## 2019-09-29 ENCOUNTER — Other Ambulatory Visit: Payer: Self-pay

## 2019-09-29 DIAGNOSIS — J3489 Other specified disorders of nose and nasal sinuses: Secondary | ICD-10-CM | POA: Insufficient documentation

## 2019-09-29 DIAGNOSIS — Z79899 Other long term (current) drug therapy: Secondary | ICD-10-CM | POA: Insufficient documentation

## 2019-09-29 DIAGNOSIS — Z20822 Contact with and (suspected) exposure to covid-19: Secondary | ICD-10-CM | POA: Insufficient documentation

## 2019-09-29 DIAGNOSIS — F17210 Nicotine dependence, cigarettes, uncomplicated: Secondary | ICD-10-CM | POA: Insufficient documentation

## 2019-09-29 DIAGNOSIS — R197 Diarrhea, unspecified: Secondary | ICD-10-CM | POA: Insufficient documentation

## 2019-09-29 HISTORY — DX: Anxiety disorder, unspecified: F41.9

## 2019-09-29 LAB — SARS CORONAVIRUS 2 (TAT 6-24 HRS): SARS Coronavirus 2: NEGATIVE

## 2019-09-29 NOTE — Discharge Instructions (Addendum)
Per our discussion, please continue to take Tylenol and ibuprofen as needed for management of your headache.  You have been tested for COVID-19 this visit.  Please be sure to quarantine for 10 days from symptom onset.  I have also given you a work note until next Monday.  Please not hesitate to return to the emergency department with any new or worsening symptoms.

## 2019-09-29 NOTE — ED Provider Notes (Signed)
[facility] EMERGENCY DEPARTMENT Provider Note   CSN: [id number] Arrival date & time: 09/29/19  [id number]     History Chief Complaint  Patient presents with  . Abdominal Pain    Renee Herrera is a 44 y.o. female.  HPI HPI Comments: Renee Herrera is a 44 y.o. female with a history of umbilical hernia who presents to the Emergency Department complaining of 3 days of mild gradually alleviating left lower quadrant pain.  Patient states she experienced some mild rhinorrhea and "scratchy throat" about 4 days ago and then 3 days ago began experiencing her left lower quadrant pain.  She reports associated diarrhea for the past 2 days which she describes as "brown watery".  No hematochezia.  She denies any diarrhea for the last 24 hours.  She denies any decreased appetite.  She also reports a diffuse frontal and bitemporal 7/10 headache which been waxing and waning since symptom onset.  She took 800 mg of ibuprofen yesterday with short-term relief.  She reports multiple sick contacts in her home with similar symptoms.  She denies any known exposure to COVID-19 but states that she does work in [employer].  She smokes 1/2 pack/day.  She denies vomiting, fevers, chills, acute cough, urinary changes, chest pain, shortness of breath, syncope.     Past Medical History:  Diagnosis Date  . Anxiety   . Depression   . Fibroid   . Hernia, umbilical     There are no problems to display for this patient.   Past Surgical History:  Procedure Laterality Date  . CESAREAN SECTION    . TUBAL LIGATION       OB History    Gravida  5   Para  4   Term  4   Preterm      AB  1   Living  4     SAB  1   TAB      Ectopic      Multiple      Live Births  4           Family History  Problem Relation Age of Onset  . Mental retardation Father   . Cancer Other   . Hypertension Other     Social History   Tobacco Use  . Smoking status: Current Every Day Smoker    Packs/day: 0.50   Types: Cigarettes  . Smokeless tobacco: Never Used  Substance Use Topics  . Alcohol use: Yes    Comment: on weekends   . Drug use: Yes    Types: Marijuana    Comment: "X pill" on Saturday 09/25/19    Home Medications Prior to Admission medications   Medication Sig Start Date End Date Taking? Authorizing Provider  albuterol (PROVENTIL HFA;VENTOLIN HFA) 108 (90 Base) MCG/ACT inhaler Inhale 2 puffs into the lungs every 6 (six) hours as needed for wheezing or shortness of breath. 07/22/18   Tacy Learn, PA-C  fluconazole (DIFLUCAN) 150 MG tablet Take 1 tablet (150 mg total) by mouth daily. 10/28/16   Luvenia Redden, PA-C  ibuprofen (ADVIL,MOTRIN) 800 MG tablet Take 1 tablet (800 mg total) by mouth every 8 (eight) hours as needed. 06/27/18   Quintella Reichert, MD  penicillin v potassium (VEETID) 500 MG tablet Take 1 tablet (500 mg total) by mouth 4 (four) times daily. 04/26/18   Robyn Haber, MD  topiramate (TOPAMAX) 50 MG tablet Take 1 tablet (50 mg total) by mouth at bedtime. 04/26/18   Lauenstein,  Synetta Shadow, MD    Allergies    Patient has no known allergies.  Review of Systems   Review of Systems  Constitutional: Negative for appetite change, chills and fever.  HENT: Positive for congestion, postnasal drip, rhinorrhea and sore throat. Negative for trouble swallowing and voice change.   Respiratory: Positive for cough (chronic). Negative for shortness of breath.   Cardiovascular: Negative for chest pain and leg swelling.  Gastrointestinal: Positive for abdominal pain and diarrhea. Negative for anal bleeding, blood in stool, constipation, nausea and vomiting.  Genitourinary: Negative for difficulty urinating, dysuria and hematuria.  Neurological: Positive for headaches. Negative for syncope.  All other systems reviewed and are negative.  Physical Exam Updated Vital Signs BP 114/79 (BP Location: Left Arm)   Pulse 70   Temp 98.2 F (36.8 C) (Oral)   Resp 18   Ht 5\' 6"  (1.676 m)   Wt 72.6  kg   LMP 09/16/2019   SpO2 100%   BMI 25.82 kg/m   Physical Exam Vitals and nursing note reviewed.  Constitutional:      General: She is not in acute distress.    Appearance: She is well-developed and normal weight. She is not ill-appearing, toxic-appearing or diaphoretic.  HENT:     Head: Normocephalic and atraumatic.     Mouth/Throat:     Mouth: Mucous membranes are moist.     Comments: Red streaking noted in the posterior oropharynx. Eyes:     Extraocular Movements: Extraocular movements intact.     Pupils: Pupils are equal, round, and reactive to light.  Cardiovascular:     Rate and Rhythm: Normal rate and regular rhythm.     Heart sounds: No murmur. No friction rub. No gallop.   Pulmonary:     Effort: Pulmonary effort is normal. No respiratory distress.     Breath sounds: Normal breath sounds. No stridor. No wheezing, rhonchi or rales.  Abdominal:     General: Abdomen is flat. Bowel sounds are normal.     Palpations: Abdomen is soft.     Tenderness: There is no abdominal tenderness. There is no guarding or rebound. Negative signs include Murphy's sign and McBurney's sign.     Hernia: A hernia is present. Hernia is present in the umbilical area.     Comments: Reducible umbilical hernia noted.  No TTP appreciated in all 4 abdominal quadrants with deep palpation.  Skin:    General: Skin is warm and dry.     Capillary Refill: Capillary refill takes less than 2 seconds.  Neurological:     General: No focal deficit present.     Mental Status: She is alert and oriented to person, place, and time.  Psychiatric:        Mood and Affect: Mood normal.        Behavior: Behavior normal.    ED Results / Procedures / Treatments   Labs (all labs ordered are listed, but only abnormal results are displayed) Labs Reviewed  SARS CORONAVIRUS 2 (TAT 6-24 HRS)   EKG None  Radiology No results found.  Procedures Procedures   Medications Ordered in ED Medications - No data to  display  ED Course  I have reviewed the triage vital signs and the nursing notes.  Pertinent labs & imaging results that were available during my care of the patient were reviewed by me and considered in my medical decision making (see chart for details).    MDM Rules/Calculators/A&P  Patient is a pleasant 44 year old female.  She presents today with gradually alleviating left lower quadrant pain as well as some mild diarrhea and a diffuse tension type headache.  Her physical exam is very reassuring.  She has no focal abdominal pain.  Nonsurgical abdomen.  Her vital signs are stable.  Afebrile.  She works in [employer] and is concerned that she could have contracted COVID-19.  I have ordered a COVID-19 test and discussed how she can look up these results on MyChart.  She understands that she needs to quarantine for 10 days from symptom onset.  She is already discussed this with her employer and is not going to be returning to work this week.  Discussed continued use of Tylenol and ibuprofen for management of her headache.  She understands to return to the emergency department any new or worsening symptoms.  She verbalized understanding the above plan.  Her questions were answered and she was amicable at the time of discharge.  Patient discharged to home/self care.  Condition at discharge: Stable  Note: Portions of this report may have been transcribed using voice recognition software. Every effort was made to ensure accuracy; however, inadvertent computerized transcription errors may be present.    Final Clinical Impression(s) / ED Diagnoses Final diagnoses:  Diarrhea, unspecified type  Rhinorrhea    Rx / DC Orders ED Discharge Orders    None       Rayna Sexton, PA-C 09/29/19 1035    Noemi Chapel, MD 10/01/19 1144

## 2019-09-29 NOTE — ED Triage Notes (Signed)
Pt c/o LLQ abdominal cramping that started Sunday night, diarrhea since Monday and headache x 3 days. Pt reports several family members have had similar symptoms.

## 2019-12-23 ENCOUNTER — Encounter (HOSPITAL_COMMUNITY): Payer: Self-pay

## 2019-12-23 ENCOUNTER — Emergency Department (HOSPITAL_COMMUNITY)
Admission: EM | Admit: 2019-12-23 | Discharge: 2019-12-23 | Disposition: A | Discharge status: Home / Self Care | Payer: Self-pay | Attending: Emergency Medicine | Admitting: Emergency Medicine

## 2019-12-23 ENCOUNTER — Other Ambulatory Visit: Payer: Self-pay

## 2019-12-23 DIAGNOSIS — F17210 Nicotine dependence, cigarettes, uncomplicated: Secondary | ICD-10-CM | POA: Insufficient documentation

## 2019-12-23 DIAGNOSIS — F329 Major depressive disorder, single episode, unspecified: Secondary | ICD-10-CM | POA: Insufficient documentation

## 2019-12-23 DIAGNOSIS — F419 Anxiety disorder, unspecified: Secondary | ICD-10-CM | POA: Insufficient documentation

## 2019-12-23 DIAGNOSIS — F32A Depression, unspecified: Secondary | ICD-10-CM

## 2019-12-23 LAB — COMPREHENSIVE METABOLIC PANEL
ALT: 15 U/L (ref 0–44)
AST: 18 U/L (ref 15–41)
Albumin: 3.9 g/dL (ref 3.5–5.0)
Alkaline Phosphatase: 63 U/L (ref 38–126)
Anion gap: 9 (ref 5–15)
BUN: 10 mg/dL (ref 6–20)
CO2: 22 mmol/L (ref 22–32)
Calcium: 8.9 mg/dL (ref 8.9–10.3)
Chloride: 104 mmol/L (ref 98–111)
Creatinine, Ser: 0.79 mg/dL (ref 0.44–1.00)
GFR calc Af Amer: >60 mL/min (ref >60)
GFR calc non Af Amer: >60 mL/min (ref >60)
Glucose, Bld: 95 mg/dL (ref 70–99)
Potassium: 3.7 mmol/L (ref 3.5–5.1)
Sodium: 135 mmol/L (ref 135–145)
Total Bilirubin: 0.7 mg/dL (ref 0.3–1.2)
Total Protein: 7 g/dL (ref 6.5–8.1)

## 2019-12-23 LAB — CBC
HCT: 39.8 % (ref 36.0–46.0)
Hemoglobin: 14.3 g/dL (ref 12.0–15.0)
MCH: 31 pg (ref 26.0–34.0)
MCHC: 35.9 g/dL (ref 30.0–36.0)
MCV: 86.1 fL (ref 80.0–100.0)
Platelets: 290 10*3/uL (ref 150–400)
RBC: 4.62 MIL/uL (ref 3.87–5.11)
RDW: 13.2 % (ref 11.5–15.5)
WBC: 8.2 10*3/uL (ref 4.0–10.5)
nRBC: 0 % (ref 0.0–0.2)

## 2019-12-23 LAB — RAPID URINE DRUG SCREEN, HOSP PERFORMED
Amphetamines: NOT DETECTED
Barbiturates: NOT DETECTED
Benzodiazepines: NOT DETECTED
Cocaine: NOT DETECTED
Opiates: NOT DETECTED
Tetrahydrocannabinol: NOT DETECTED

## 2019-12-23 LAB — ETHANOL: Alcohol, Ethyl (B): <10 mg/dL (ref <10)

## 2019-12-23 LAB — SALICYLATE LEVEL: Salicylate Lvl: <7 mg/dL — ABNORMAL LOW (ref 7.0–30.0)

## 2019-12-23 LAB — ACETAMINOPHEN LEVEL: Acetaminophen (Tylenol), Serum: <10 ug/mL — ABNORMAL LOW (ref 10–30)

## 2019-12-23 LAB — PREGNANCY, URINE: Preg Test, Ur: NEGATIVE

## 2019-12-23 NOTE — ED Notes (Signed)
Pt dressed in burgundy scrubs. Pt belongings locked in the pt locker room and valuables were noted and locked up by security. Pt calm on bed.

## 2019-12-23 NOTE — ED Notes (Signed)
Pt denies SI or HI .  

## 2019-12-23 NOTE — ED Triage Notes (Signed)
Pt presents to ED with "really bad anxiety and depression and if I don't get help I am going to hurt myself." "My daughter needs to be in here clearly, she acts like she is so grown, this is what brought all this on." When asked about SI plan pt states "not really"

## 2019-12-23 NOTE — Discharge Instructions (Addendum)
Return if any problems.

## 2019-12-23 NOTE — ED Provider Notes (Signed)
[facility] Provider Note   CSN: [id number] Arrival date & time: 12/23/19  1349     History Chief Complaint  Patient presents with  . V70.1    Renee Herrera is a 44 y.o. female.  The history is provided by the patient. No language interpreter was used.  Depression This is a new problem. The problem occurs constantly. The problem has been gradually worsening. Nothing aggravates the symptoms. Nothing relieves the symptoms. She has tried nothing for the symptoms.  Pt reports she has a history of depression.  Pt reports suicidal thoughts on and off for years.  Pt reports she is not currently  feeling suicidal but she feels like she needs to get into some counseling.  Pt reports she is trying to help her daughter and granddaughter.  Pt denies thoughts of self harm or harming anyone.      Past Medical History:  Diagnosis Date  . Anxiety   . Depression   . Fibroid   . Hernia, umbilical     There are no problems to display for this patient.   Past Surgical History:  Procedure Laterality Date  . CESAREAN SECTION    . TUBAL LIGATION       OB History    Gravida  5   Para  4   Term  4   Preterm      AB  1   Living  4     SAB  1   TAB      Ectopic      Multiple      Live Births  4           Family History  Problem Relation Age of Onset  . Mental retardation Father   . Cancer Other   . Hypertension Other     Social History   Tobacco Use  . Smoking status: Current Every Day Smoker    Packs/day: 0.50    Types: Cigarettes  . Smokeless tobacco: Never Used  Vaping Use  . Vaping Use: Never used  Substance Use Topics  . Alcohol use: Yes    Comment: on weekends   . Drug use: Not Currently    Types: Marijuana    Comment: "X pill" on Saturday 09/25/19    Home Medications Prior to Admission medications   Medication Sig Start Date End Date Taking? Authorizing Provider  albuterol (PROVENTIL HFA;VENTOLIN HFA) 108 (90 Base) MCG/ACT  inhaler Inhale 2 puffs into the lungs every 6 (six) hours as needed for wheezing or shortness of breath. 07/22/18   Tacy Learn, PA-C  fluconazole (DIFLUCAN) 150 MG tablet Take 1 tablet (150 mg total) by mouth daily. 10/28/16   Luvenia Redden, PA-C  ibuprofen (ADVIL,MOTRIN) 800 MG tablet Take 1 tablet (800 mg total) by mouth every 8 (eight) hours as needed. 06/27/18   Quintella Reichert, MD  penicillin v potassium (VEETID) 500 MG tablet Take 1 tablet (500 mg total) by mouth 4 (four) times daily. 04/26/18   Robyn Haber, MD  topiramate (TOPAMAX) 50 MG tablet Take 1 tablet (50 mg total) by mouth at bedtime. 04/26/18   Robyn Haber, MD    Allergies    Patient has no known allergies.  Review of Systems   Review of Systems  Psychiatric/Behavioral: Positive for depression.  All other systems reviewed and are negative.   Physical Exam Updated Vital Signs BP (!) 134/100 (BP Location: Right Arm)   Pulse 71   Temp 98.5 F (  36.9 C) (Oral)   Resp 16   Ht 5\' 6"  (1.676 m)   Wt 72.6 kg   LMP 12/02/2019   SpO2 97%   BMI 25.82 kg/m   Physical Exam Vitals and nursing note reviewed.  Constitutional:      Appearance: She is well-developed.  HENT:     Head: Normocephalic.     Right Ear: Tympanic membrane normal.     Left Ear: Tympanic membrane normal.     Mouth/Throat:     Mouth: Mucous membranes are moist.  Eyes:     Pupils: Pupils are equal, round, and reactive to light.  Cardiovascular:     Rate and Rhythm: Normal rate.     Pulses: Normal pulses.  Pulmonary:     Effort: Pulmonary effort is normal.  Abdominal:     General: There is no distension.  Musculoskeletal:        General: Normal range of motion.     Cervical back: Normal range of motion.  Skin:    General: Skin is warm.  Neurological:     Mental Status: She is alert and oriented to person, place, and time.  Psychiatric:        Mood and Affect: Mood normal.     ED Results / Procedures / Treatments   Labs (all  labs ordered are listed, but only abnormal results are displayed) Labs Reviewed  COMPREHENSIVE METABOLIC PANEL  CBC  RAPID URINE DRUG SCREEN, HOSP PERFORMED  PREGNANCY, URINE  ETHANOL  SALICYLATE LEVEL  ACETAMINOPHEN LEVEL    EKG None  Radiology No results found.  Procedures Procedures (including critical care time)  Medications Ordered in ED Medications - No data to display  ED Course  I have reviewed the triage vital signs and the nursing notes.  Pertinent labs & imaging results that were available during my care of the patient were reviewed by me and considered in my medical decision making (see chart for details).    MDM Rules/Calculators/A&P                          MDM:  Pt given referral information.  Pt advised to return if symptoms worsen or change Final Clinical Impression(s) / ED Diagnoses Final diagnoses:  Depression, unspecified depression type    Rx / DC Orders ED Discharge Orders    None    An After Visit Summary was printed and given to the patient.    Fransico Meadow, Hershal Coria 12/24/19 1603    Milton Ferguson, MD 12/25/19 [phone]

## 2021-06-14 ENCOUNTER — Encounter (HOSPITAL_COMMUNITY): Payer: Self-pay | Admitting: *Deleted

## 2021-06-14 ENCOUNTER — Emergency Department (HOSPITAL_COMMUNITY)
Admission: EM | Admit: 2021-06-14 | Discharge: 2021-06-14 | Disposition: A | Discharge status: Home / Self Care | Payer: Medicaid - Out of State | Attending: Emergency Medicine | Admitting: Emergency Medicine

## 2021-06-14 DIAGNOSIS — F17210 Nicotine dependence, cigarettes, uncomplicated: Secondary | ICD-10-CM | POA: Insufficient documentation

## 2021-06-14 DIAGNOSIS — R1030 Lower abdominal pain, unspecified: Secondary | ICD-10-CM | POA: Insufficient documentation

## 2021-06-14 LAB — URINALYSIS, ROUTINE W REFLEX MICROSCOPIC
Bilirubin Urine: NEGATIVE
Glucose, UA: NEGATIVE mg/dL
Ketones, ur: NEGATIVE mg/dL
Leukocytes,Ua: NEGATIVE
Nitrite: NEGATIVE
Protein, ur: NEGATIVE mg/dL
Specific Gravity, Urine: 1.025 (ref 1.005–1.030)
pH: 6 (ref 5.0–8.0)

## 2021-06-14 LAB — CBC
HCT: 42.4 % (ref 36.0–46.0)
Hemoglobin: 15.3 g/dL — ABNORMAL HIGH (ref 12.0–15.0)
MCH: 31 pg (ref 26.0–34.0)
MCHC: 36.1 g/dL — ABNORMAL HIGH (ref 30.0–36.0)
MCV: 86 fL (ref 80.0–100.0)
Platelets: 326 10*3/uL (ref 150–400)
RBC: 4.93 MIL/uL (ref 3.87–5.11)
RDW: 14 % (ref 11.5–15.5)
WBC: 9.9 10*3/uL (ref 4.0–10.5)
nRBC: 0 % (ref 0.0–0.2)

## 2021-06-14 LAB — COMPREHENSIVE METABOLIC PANEL
ALT: 15 U/L (ref 0–44)
AST: 16 U/L (ref 15–41)
Albumin: 4 g/dL (ref 3.5–5.0)
Alkaline Phosphatase: 63 U/L (ref 38–126)
Anion gap: 12 (ref 5–15)
BUN: 12 mg/dL (ref 6–20)
CO2: 23 mmol/L (ref 22–32)
Calcium: 8.6 mg/dL — ABNORMAL LOW (ref 8.9–10.3)
Chloride: 99 mmol/L (ref 98–111)
Creatinine, Ser: 0.83 mg/dL (ref 0.44–1.00)
GFR, Estimated: >60 mL/min (ref >60)
Glucose, Bld: 140 mg/dL — ABNORMAL HIGH (ref 70–99)
Potassium: 3.5 mmol/L (ref 3.5–5.1)
Sodium: 134 mmol/L — ABNORMAL LOW (ref 135–145)
Total Bilirubin: 0.9 mg/dL (ref 0.3–1.2)
Total Protein: 7 g/dL (ref 6.5–8.1)

## 2021-06-14 LAB — URINALYSIS, MICROSCOPIC (REFLEX)

## 2021-06-14 LAB — LIPASE, BLOOD: Lipase: 26 U/L (ref 11–51)

## 2021-06-14 NOTE — ED Provider Notes (Signed)
[facility] EMERGENCY DEPARTMENT Provider Note   CSN: [id number] Arrival date & time: 06/14/21  1647     History Chief Complaint  Patient presents with   Abdominal Pain    Renee Herrera is a 45 y.o. female who presents to the emergency department with intermittent poorly characterized lower abdominal pain has been ongoing for the last couple days.  Patient does describe similar symptoms in the past which was attributed to gas.  Patient does mention that she is sexually active with 2 female partners with no condom use.  She denies any nausea, vomiting, diarrhea, vaginal discharge, foul odor, urinary complaints, pelvic pain, fever, chills, chest pain, and shortness of breath.  Nothing seems to make her abdominal pain better or worse. She rates her abdominal pain mild in severity. She is not take anything for abdominal pain.  Patient does mention that one of her partners stated that his "penis hurt" which prompted her arrival to come get evaluated.   Abdominal Pain     Past Medical History:  Diagnosis Date   Anxiety    Depression    Fibroid    Hernia, umbilical     There are no problems to display for this patient.   Past Surgical History:  Procedure Laterality Date   CESAREAN SECTION     TUBAL LIGATION       OB History     Gravida  5   Para  4   Term  4   Preterm      AB  1   Living  4      SAB  1   IAB      Ectopic      Multiple      Live Births  4           Family History  Problem Relation Age of Onset   Mental retardation Father    Cancer Other    Hypertension Other     Social History   Tobacco Use   Smoking status: Every Day    Packs/day: 0.50    Types: Cigarettes   Smokeless tobacco: Never  Vaping Use   Vaping Use: Never used  Substance Use Topics   Alcohol use: Yes    Comment: on weekends    Drug use: Yes    Types: Marijuana    Comment: "X pill" on Saturday 09/25/19    Home Medications Prior to Admission medications    Not on File    Allergies    Patient has no known allergies.  Review of Systems   Review of Systems  Gastrointestinal:  Positive for abdominal pain.  All other systems reviewed and are negative.  Physical Exam Updated Vital Signs BP 121/75 (BP Location: Right Arm)    Pulse 72    Temp 98.4 F (36.9 C)    Resp 16    SpO2 99%   Physical Exam Vitals and nursing note reviewed.  Constitutional:      General: She is not in acute distress.    Appearance: Normal appearance.  HENT:     Head: Normocephalic and atraumatic.  Eyes:     General:        Right eye: No discharge.        Left eye: No discharge.  Cardiovascular:     Comments: Regular rate and rhythm.  S1/S2 are distinct without any evidence of murmur, rubs, or gallops.  Radial pulses are 2+ bilaterally.  Dorsalis pedis pulses are 2+ bilaterally.  No evidence of pedal edema. Pulmonary:     Comments: Clear to auscultation bilaterally.  Normal effort.  No respiratory distress.  No evidence of wheezes, rales, or rhonchi heard throughout. Abdominal:     General: Abdomen is flat. Bowel sounds are normal. There is no distension.     Tenderness: There is no abdominal tenderness. There is no guarding or rebound.  Musculoskeletal:        General: Normal range of motion.     Cervical back: Neck supple.  Skin:    General: Skin is warm and dry.     Findings: No rash.  Neurological:     General: No focal deficit present.     Mental Status: She is alert.  Psychiatric:        Mood and Affect: Mood normal.        Behavior: Behavior normal.    ED Results / Procedures / Treatments   Labs (all labs ordered are listed, but only abnormal results are displayed) Labs Reviewed  COMPREHENSIVE METABOLIC PANEL - Abnormal; Notable for the following components:      Result Value   Sodium 134 (*)    Glucose, Bld 140 (*)    Calcium 8.6 (*)    All other components within normal limits  CBC - Abnormal; Notable for the following components:    Hemoglobin 15.3 (*)    MCHC 36.1 (*)    All other components within normal limits  URINALYSIS, ROUTINE W REFLEX MICROSCOPIC - Abnormal; Notable for the following components:   Hgb urine dipstick SMALL (*)    All other components within normal limits  URINALYSIS, MICROSCOPIC (REFLEX) - Abnormal; Notable for the following components:   Bacteria, UA RARE (*)    All other components within normal limits  LIPASE, BLOOD    EKG None  Radiology No results found.  Procedures Procedures   Medications Ordered in ED Medications - No data to display  ED Course  I have reviewed the triage vital signs and the nursing notes.  Pertinent labs & imaging results that were available during my care of the patient were reviewed by me and considered in my medical decision making (see chart for details).    MDM Rules/Calculators/A&P                          Renee Herrera is a 45 y.o. female who presents the emergency department with lower abdominal pain.  At this point her abdomen is nontender on my exam I will probably defer imaging for now.  Will reevaluate after we get lab results back.  Shared decision making was done with the patient in regards to pelvic exam and STD testing.  Patient states she would like to hold off for now as she is not having any of the symptoms.  I discussed that without doing these tests we are unable to further evaluate whether or not she has an STD infection or other gynecological pathology.  Patient expressed full understanding. Vital signs are normal and she is resting comfortably in the emergency department.  CBC was without any evidence of leukocytosis.  CMP showed slightly elevated glucose was otherwise without any significant abnormality.  Urinalysis was negative for any infection.  Lipase is normal.  I discussed at length with the patient her laboratory findings and shared decision-making was done with regards to pursuing CT abdomen with contrast.  We discussed that  although her lab work was normal this still  could be early in time course for potential pathology including appendicitis, torsion, PID, diverticulitis, kidney stones, hepatobiliary pathology, bowel obstruction.  Patient expressed full understanding with these diagnoses and wishes to hold off on imaging at this time.  She is in no acute distress at this time and abdomen was nontender on reevaluation.  Strict return precautions were given.  She is safe for discharge.     Final Clinical Impression(s) / ED Diagnoses Final diagnoses:  Lower abdominal pain    Rx / DC Orders ED Discharge Orders     None        Cherrie Gauze 06/14/21 1932    Luna Fuse, MD 06/21/21 2320

## 2021-06-14 NOTE — Discharge Instructions (Addendum)
Your lab work was reassuring.  As we discussed, please return to the emergency department if you experience worsening abdominal pain, fevers, intractable vomiting/diarrhea, or any other concerns you might have.  This is important as we will likely have to pursue imaging at that time.  You may also return if you experience any vaginal symptoms.  Please follow-up with your primary care doctor in the meantime.

## 2021-06-14 NOTE — ED Triage Notes (Signed)
Abdominal pain x 2 days, has a history of hernia

## 2022-04-01 ENCOUNTER — Ambulatory Visit (HOSPITAL_COMMUNITY)
Admission: EM | Admit: 2022-04-01 | Discharge: 2022-04-01 | Disposition: A | Discharge status: Home / Self Care | Payer: Medicaid - Out of State | Attending: Internal Medicine | Admitting: Internal Medicine

## 2022-04-01 DIAGNOSIS — R519 Headache, unspecified: Secondary | ICD-10-CM

## 2022-04-01 DIAGNOSIS — J301 Allergic rhinitis due to pollen: Secondary | ICD-10-CM

## 2022-04-01 DIAGNOSIS — M545 Low back pain, unspecified: Secondary | ICD-10-CM

## 2022-04-01 MED ORDER — IBUPROFEN 800 MG PO TABS
800.0000 mg | ORAL_TABLET | Freq: Once | ORAL | Status: AC
  Administered 2022-04-01: 800 mg via ORAL

## 2022-04-01 MED ORDER — TIZANIDINE HCL 4 MG PO TABS: 4.0000 mg | ORAL_TABLET | Freq: Four times a day (QID) | ORAL | 0 refills | Status: DC | PRN

## 2022-04-01 MED ORDER — IBUPROFEN 800 MG PO TABS
ORAL_TABLET | ORAL | Status: AC
  Filled 2022-04-01: qty 1

## 2022-04-01 MED ORDER — CETIRIZINE HCL 10 MG PO TABS: 10.0000 mg | ORAL_TABLET | Freq: Every day | ORAL | 2 refills | Status: DC

## 2022-04-01 NOTE — ED Triage Notes (Signed)
Patient with c/o headaches, umbilical hernia with back pain, and trouble controlling bladder. Said she was diagnosed with fibroid tumors in 2012.

## 2022-04-01 NOTE — ED Provider Notes (Signed)
[name]    CSN: [id number] Arrival date & time: 04/01/22  1021      History   Chief Complaint Chief Complaint  Patient presents with   multiple complaints    HPI Renee Herrera is a 46 y.o. female.   Patient presents urgent care with multiple complaints, but most concerning is her headache and back pain.  Headache is intermittent and has improved significantly since onset yesterday.  She reports a history of migraine headaches and states that she has headaches very frequently.  She sometimes experiences spots in her vision and other times she does not.  She is not currently experiencing any visual changes, decreased visual acuity, dizziness, nausea, vomiting, abdominal pain, diarrhea, or fever/chills.  No neck pain reported.  She is concerned that she may have COVID-19 since the last time that she was sick with COVID-19 she experienced a headache as well.  She reports that when she gets headaches, she usually "just needs some coffee or some marijuana".  She smoked marijuana yesterday and had a cup of coffee this morning.  She states that neither of these interventions helped very much with her headache.  5 on a scale of 0-10 and localized to the left side of her head.  Headache sometimes crosses midline and goes over to the right side of her head as well to the frontal aspect of her forehead.  She has not taken any medications prior to arrival urgent care for her headache symptoms.  She would also like to be evaluated for her lower back discomfort that she states has been on and off over the last few years.  She works at [employer] and works second shift where she is on her feet frequently.  No known injury to cause lower back discomfort, no numbness or tingling to the bilateral lower extremities, saddle anesthesia symptoms, urinary symptoms, bowel/bladder incontinence, or difficulty ambulating without limp.  She has not attempted use of any over the counter meds for her  back pain prior to arrival at urgent care.     Past Medical History:  Diagnosis Date   Anxiety    Depression    Fibroid    Hernia, umbilical     There are no problems to display for this patient.   Past Surgical History:  Procedure Laterality Date   CESAREAN SECTION     TUBAL LIGATION      OB History     Gravida  5   Para  4   Term  4   Preterm      AB  1   Living  4      SAB  1   IAB      Ectopic      Multiple      Live Births  4            Home Medications    Prior to Admission medications   Medication Sig Start Date End Date Taking? Authorizing Provider  cetirizine (ZYRTEC) 10 MG tablet Take 1 tablet (10 mg total) by mouth daily. 04/01/22  Yes Talbot Grumbling, FNP  tiZANidine (ZANAFLEX) 4 MG tablet Take 1 tablet (4 mg total) by mouth every 6 (six) hours as needed for muscle spasms. 04/01/22  Yes StanhopeStasia Cavalier, FNP    Family History Family History  Problem Relation Age of Onset   Mental retardation Father    Cancer Other    Hypertension Other     Social History  Social History   Tobacco Use   Smoking status: Every Day    Packs/day: 0.50    Types: Cigarettes   Smokeless tobacco: Never  Vaping Use   Vaping Use: Never used  Substance Use Topics   Alcohol use: Yes    Comment: on weekends    Drug use: Yes    Types: Marijuana    Comment: "X pill" on Saturday 09/25/19     Allergies   Patient has no known allergies.   Review of Systems Review of Systems Per HPI  Physical Exam Triage Vital Signs ED Triage Vitals  Enc Vitals Group     BP 04/01/22 1207 138/88     Pulse Rate 04/01/22 1205 63     Resp 04/01/22 1205 16     Temp 04/01/22 1205 97.8 F (36.6 C)     Temp Source 04/01/22 1205 Oral     SpO2 04/01/22 1205 99 %     Weight --      Height --      Head Circumference --      Peak Flow --      Pain Score --      Pain Loc --      Pain Edu? --      Excl. in [place]? --    No data found.  Updated Vital  Signs BP 138/88 (BP Location: Right Arm)   Pulse 63   Temp 97.8 F (36.6 C) (Oral)   Resp 16   SpO2 99%   Visual Acuity Right Eye Distance:   Left Eye Distance:   Bilateral Distance:    Right Eye Near:   Left Eye Near:    Bilateral Near:     Physical Exam Vitals and nursing note reviewed.  Constitutional:      Appearance: Normal appearance. She is not ill-appearing or toxic-appearing.  HENT:     Head: Normocephalic and atraumatic.     Right Ear: Hearing, tympanic membrane, ear canal and external ear normal.     Left Ear: Hearing, tympanic membrane, ear canal and external ear normal.     Nose: Rhinorrhea present.     Right Turbinates: Swollen and pale.     Left Turbinates: Swollen and pale.     Mouth/Throat:     Lips: Pink.     Mouth: Mucous membranes are moist.     Pharynx: Oropharynx is clear. Uvula midline. No posterior oropharyngeal erythema.     Comments: Small amount of clear postnasal drainage visualized to the posterior oropharynx.  Eyes:     General: Lids are normal. Vision grossly intact. Gaze aligned appropriately.        Right eye: No discharge.        Left eye: No discharge.     Extraocular Movements: Extraocular movements intact.     Conjunctiva/sclera: Conjunctivae normal.     Pupils: Pupils are equal, round, and reactive to light.     Comments: EOMS are normal without dizziness or pain to eyes elicited.  Cardiovascular:     Rate and Rhythm: Normal rate and regular rhythm.     Heart sounds: Normal heart sounds, S1 normal and S2 normal.  Pulmonary:     Effort: Pulmonary effort is normal. No tachypnea or respiratory distress.     Breath sounds: Normal breath sounds and air entry.  Musculoskeletal:     Cervical back: Neck supple.  Lymphadenopathy:     Cervical: No cervical adenopathy.  Skin:    General: Skin is warm  and dry.     Capillary Refill: Capillary refill takes less than 2 seconds.     Findings: No rash.  Neurological:     General: No focal  deficit present.     Mental Status: She is alert and oriented to person, place, and time. Mental status is at baseline.     Cranial Nerves: Cranial nerves 2-12 are intact. No dysarthria or facial asymmetry.     Sensory: Sensation is intact.     Motor: Motor function is intact.     Coordination: Coordination is intact.     Comments: 5/5 strength to bilateral upper extremities. 5/5 strength against resistance to bilateral lower extremities with resistance. Non focal neuro exam.  Psychiatric:        Mood and Affect: Mood normal.        Speech: Speech normal.        Behavior: Behavior normal.        Thought Content: Thought content normal.        Judgment: Judgment normal.      UC Treatments / Results  Labs (all labs ordered are listed, but only abnormal results are displayed) Labs Reviewed - No data to display  EKG   Radiology No results found.  Procedures Procedures (including critical care time)  Medications Ordered in UC Medications  ibuprofen (ADVIL) tablet 800 mg (800 mg Oral Given 04/01/22 1240)    Initial Impression / Assessment and Plan / UC Course  I have reviewed the triage vital signs and the nursing notes.  Pertinent labs & imaging results that were available during my care of the patient were reviewed by me and considered in my medical decision making (see chart for details).     *** Final Clinical Impressions(s) / UC Diagnoses   Final diagnoses:  Seasonal allergic rhinitis due to pollen  Bad headache  Acute bilateral low back pain without sciatica     Discharge Instructions      Your headache and nasal congestion are likely related to seasonal allergies. I would like for you to start taking cetirizine 10 mg once daily during fall allergy season to help with your headaches and nasal congestion/drainage.    You may use ibuprofen 600 mg every 6 hours as needed at home to help with your headache and low back pain. Take this medicine with food to avoid  stomach upset.  You may also use Zanaflex muscle relaxer every 6 hours as needed for muscle spasm, but do not take this medication during the daytime, go to work, drive, or drink alcohol while taking this medicine as it can make you sleepy.  Apply heat and perform gentle range of motion exercises to the lower back.   To find a primary care provider go to [email or web address] and follow the prompts to schedule a new patient appointment for primary care.  Someone from [facility] health will be reaching out to you either via MyChart or phone call to help you set up a primary care provider appointment as well.  It is very important to have a primary care provider to manage your overall health and prevent/manage chronic medical conditions.   If you develop any new or worsening symptoms or do not improve in the next 2 to 3 days, please return.  If your symptoms are severe, please go to the emergency room.  Follow-up with your primary care provider for further evaluation and management of your symptoms as well as ongoing wellness visits.  I hope you feel better!  ED Prescriptions     Medication Sig Dispense Auth. Provider   cetirizine (ZYRTEC) 10 MG tablet Take 1 tablet (10 mg total) by mouth daily. 30 tablet Talbot Grumbling, FNP   tiZANidine (ZANAFLEX) 4 MG tablet Take 1 tablet (4 mg total) by mouth every 6 (six) hours as needed for muscle spasms. 30 tablet Talbot Grumbling, FNP      PDMP not reviewed this encounter.

## 2022-04-01 NOTE — Discharge Instructions (Addendum)
Your headache and nasal congestion are likely related to seasonal allergies. I would like for you to start taking cetirizine 10 mg once daily during fall allergy season to help with your headaches and nasal congestion/drainage.    You may use ibuprofen 600 mg every 6 hours as needed at home to help with your headache and low back pain. Take this medicine with food to avoid stomach upset.  You may also use Zanaflex muscle relaxer every 6 hours as needed for muscle spasm, but do not take this medication during the daytime, go to work, drive, or drink alcohol while taking this medicine as it can make you sleepy.  Apply heat and perform gentle range of motion exercises to the lower back.   To find a primary care provider go to [email or web address] and follow the prompts to schedule a new patient appointment for primary care.  Someone from [facility] health will be reaching out to you either via MyChart or phone call to help you set up a primary care provider appointment as well.  It is very important to have a primary care provider to manage your overall health and prevent/manage chronic medical conditions.   If you develop any new or worsening symptoms or do not improve in the next 2 to 3 days, please return.  If your symptoms are severe, please go to the emergency room.  Follow-up with your primary care provider for further evaluation and management of your symptoms as well as ongoing wellness visits.  I hope you feel better!

## 2022-06-19 DIAGNOSIS — J020 Streptococcal pharyngitis: Secondary | ICD-10-CM | POA: Insufficient documentation

## 2022-06-19 DIAGNOSIS — B974 Respiratory syncytial virus as the cause of diseases classified elsewhere: Secondary | ICD-10-CM | POA: Insufficient documentation

## 2022-06-19 DIAGNOSIS — U071 COVID-19: Secondary | ICD-10-CM | POA: Insufficient documentation

## 2022-06-20 ENCOUNTER — Emergency Department (HOSPITAL_COMMUNITY)
Admission: EM | Admit: 2022-06-20 | Discharge: 2022-06-20 | Disposition: A | Discharge status: Home / Self Care | Payer: Self-pay | Attending: Emergency Medicine | Admitting: Emergency Medicine

## 2022-06-20 ENCOUNTER — Encounter (HOSPITAL_COMMUNITY): Payer: Self-pay

## 2022-06-20 DIAGNOSIS — B338 Other specified viral diseases: Secondary | ICD-10-CM

## 2022-06-20 DIAGNOSIS — U071 COVID-19: Secondary | ICD-10-CM

## 2022-06-20 DIAGNOSIS — J020 Streptococcal pharyngitis: Secondary | ICD-10-CM

## 2022-06-20 LAB — RESP PANEL BY RT-PCR (RSV, FLU A&B, COVID)  RVPGX2
Influenza A by PCR: NEGATIVE
Influenza B by PCR: NEGATIVE
Resp Syncytial Virus by PCR: POSITIVE — AB
SARS Coronavirus 2 by RT PCR: POSITIVE — AB

## 2022-06-20 LAB — GROUP A STREP BY PCR: Group A Strep by PCR: DETECTED — AB

## 2022-06-20 MED ORDER — ACETAMINOPHEN 325 MG PO TABS
650.0000 mg | ORAL_TABLET | Freq: Once | ORAL | Status: AC
  Administered 2022-06-20: 650 mg via ORAL
  Filled 2022-06-20: qty 2

## 2022-06-20 MED ORDER — PENICILLIN V POTASSIUM 500 MG PO TABS: 500.0000 mg | ORAL_TABLET | Freq: Three times a day (TID) | ORAL | 0 refills | Status: AC

## 2022-06-20 NOTE — ED Provider Notes (Signed)
[facility] EMERGENCY DEPARTMENT Provider Note   CSN: [id number] Arrival date & time: 06/19/22  2301     History  Chief Complaint  Patient presents with   Sore Throat    Renee Herrera is a 46 y.o. female.  She is presenting with sore throat that is been going on for a few days.  Associated with some nasal congestion and cough.  Low-grade fevers.  Multiple sick contacts.   Sore Throat This is a new problem. The current episode started 2 days ago. The problem occurs constantly. The problem has not changed since onset.Pertinent negatives include no chest pain, no abdominal pain, no headaches and no shortness of breath. The symptoms are aggravated by swallowing. Nothing relieves the symptoms. She has tried nothing for the symptoms. The treatment provided no relief.       Home Medications Prior to Admission medications   Medication Sig Start Date End Date Taking? Authorizing Provider  cetirizine (ZYRTEC) 10 MG tablet Take 1 tablet (10 mg total) by mouth daily. 04/01/22   Talbot Grumbling, FNP  tiZANidine (ZANAFLEX) 4 MG tablet Take 1 tablet (4 mg total) by mouth every 6 (six) hours as needed for muscle spasms. 04/01/22   Talbot Grumbling, FNP      Allergies    Patient has no known allergies.    Review of Systems   Review of Systems  Constitutional:  Positive for fever.  HENT:  Positive for sneezing and sore throat.   Respiratory:  Positive for cough. Negative for shortness of breath.   Cardiovascular:  Negative for chest pain.  Gastrointestinal:  Negative for abdominal pain.  Neurological:  Negative for headaches.    Physical Exam Updated Vital Signs BP (!) 104/90 (BP Location: Right Arm)   Pulse 95   Temp 99.7 F (37.6 C)   Resp 14   SpO2 97%  Physical Exam Vitals and nursing note reviewed.  Constitutional:      General: She is not in acute distress.    Appearance: She is well-developed.  HENT:     Head: Normocephalic and atraumatic.      Right Ear: Tympanic membrane normal.     Left Ear: Tympanic membrane normal.     Mouth/Throat:     Mouth: Mucous membranes are moist.     Pharynx: Uvula midline. Posterior oropharyngeal erythema present. No pharyngeal swelling or oropharyngeal exudate.  Eyes:     Conjunctiva/sclera: Conjunctivae normal.  Cardiovascular:     Rate and Rhythm: Normal rate and regular rhythm.     Heart sounds: No murmur heard. Pulmonary:     Effort: Pulmonary effort is normal. No respiratory distress.     Breath sounds: Normal breath sounds.  Abdominal:     Palpations: Abdomen is soft.     Tenderness: There is no abdominal tenderness.  Musculoskeletal:        General: No swelling.     Cervical back: Neck supple.  Skin:    General: Skin is warm and dry.     Capillary Refill: Capillary refill takes less than 2 seconds.  Neurological:     General: No focal deficit present.     Mental Status: She is alert.     ED Results / Procedures / Treatments   Labs (all labs ordered are listed, but only abnormal results are displayed) Labs Reviewed  RESP PANEL BY RT-PCR (RSV, FLU A&B, COVID)  RVPGX2 - Abnormal; Notable for the following components:      Result  Value   SARS Coronavirus 2 by RT PCR POSITIVE (*)    Resp Syncytial Virus by PCR POSITIVE (*)    All other components within normal limits  GROUP A STREP BY PCR - Abnormal; Notable for the following components:   Group A Strep by PCR DETECTED (*)    All other components within normal limits    EKG None  Radiology No results found.  Procedures Procedures    Medications Ordered in ED Medications  acetaminophen (TYLENOL) tablet 650 mg (650 mg Oral Given 06/20/22 0130)    ED Course/ Medical Decision Making/ A&P                           Medical Decision Making Risk Prescription drug management.   This patient complains of sore throat; this involves an extensive number of treatment Options and is a complaint that carries with it a  high risk of complications and morbidity. The differential includes strep, viral, COVID, flu  I ordered, reviewed and interpreted labs, which included strep throat positive.  Also tested positive for COVID and RSV Previous records obtained and reviewed in epic no recent admissions Social determinants considered, ongoing tobacco use Critical Interventions: None  After the interventions stated above, I reevaluated the patient and found patient to be symptomatic although otherwise well-appearing Admission and further testing considered, no evidence of abscess or obstruction at this time.  Will cover with antibiotics and recommended other symptomatic treatment.  Return instructions discussed         Final Clinical Impression(s) / ED Diagnoses Final diagnoses:  Strep throat  COVID-19 virus infection  RSV infection    Rx / DC Orders ED Discharge Orders          Ordered    penicillin v potassium (VEETID) 500 MG tablet  3 times daily        06/20/22 0825              Hayden Rasmussen, MD 06/20/22 1820

## 2022-06-20 NOTE — ED Provider Triage Note (Signed)
Emergency Medicine Provider Triage Evaluation Note  Renee Herrera , Herrera 46 y.o. female  was evaluated in triage.  Pt complains of sore throat and ear pain x 2 days. Some associated cough. Pain worse with swallowing. Unknown sick contacts however works at [employer]. No CP, SOB, HA.  Review of Systems  Positive: Sore throat, ear pain, cough Negative:   Physical Exam  BP (!) 136/91   Pulse (!) 103   Temp (!) 100.4 F (38 C) (Oral)   Resp 20   SpO2 98%  Gen:   Awake, no distress   Resp:  Normal effort  Throat:  PO erythematous, uvula midline, tonsils 1+ BIL. No pooling of secretions MSK:   Moves extremities without difficulty  Other:    Medical Decision Making  Medically screening exam initiated at 1:11 AM.  Appropriate orders placed.  Renee Herrera was informed that the remainder of the evaluation will be completed by another provider, this initial triage assessment does not replace that evaluation, and the importance of remaining in the ED until their evaluation is complete.  Sore throat, ear pain   Renee Bessler A, PA-C 06/20/22 0112

## 2022-06-20 NOTE — ED Triage Notes (Signed)
Pt states that she has been having a sore throat and R ear pain for the past 2 days.

## 2022-06-20 NOTE — Discharge Instructions (Signed)
You were seen in the emergency department for sore throat.  You tested positive for strep throat and we are sending a prescription for antibiotics to your pharmacy.  You also tested positive for COVID and RSV.  You should isolate for 5 days.  Drink plenty of fluids, Tylenol for pain.  Return to the emergency department if any worsening or concerning symptoms.

## 2022-10-31 ENCOUNTER — Emergency Department (HOSPITAL_COMMUNITY): Payer: Self-pay

## 2022-10-31 ENCOUNTER — Emergency Department (HOSPITAL_COMMUNITY)
Admission: EM | Admit: 2022-10-31 | Discharge: 2022-10-31 | Disposition: A | Discharge status: Home / Self Care | Payer: Self-pay | Attending: Emergency Medicine | Admitting: Emergency Medicine

## 2022-10-31 DIAGNOSIS — B353 Tinea pedis: Secondary | ICD-10-CM

## 2022-10-31 DIAGNOSIS — B351 Tinea unguium: Secondary | ICD-10-CM | POA: Insufficient documentation

## 2022-10-31 MED ORDER — ACETAMINOPHEN 500 MG PO TABS
1000.0000 mg | ORAL_TABLET | Freq: Once | ORAL | Status: AC
  Administered 2022-10-31: 1000 mg via ORAL
  Filled 2022-10-31: qty 2

## 2022-10-31 NOTE — ED Provider Triage Note (Signed)
Emergency Medicine Provider Triage Evaluation Note  Renee Herrera , a 47 y.o. female  was evaluated in triage.  Pt complains of toe pain for several months, reports "putting off" getting it evaluated. She feels like it could be broken, but denies any injury to it. Complaining of R little toe pain  Review of Systems  Positive: Toe pain Negative:   Physical Exam  BP (!) 120/98 (BP Location: Right Arm)   Pulse 81   Temp (!) 100.6 F (38.1 C) (Oral)   Resp 16   SpO2 100%  Gen:   Awake, no distress   Resp:  Normal effort  MSK:   Moves extremities without difficulty  Other:  Greenish growth between the 4th and 5th digits of the right foot, could be fungal, maybe some skin breakdown but no obvious deep wound  Medical Decision Making  Medically screening exam initiated at 5:59 PM.  Appropriate orders placed.  Renee Herrera was informed that the remainder of the evaluation will be completed by another provider, this initial triage assessment does not replace that evaluation, and the importance of remaining in the ED until their evaluation is complete.  Febrile in triage, given tylenol Denies any other infectious sx, no hx of diabetes so doubt diabetic foot infection   Renee Skelley T, PA-C 10/31/22 1800

## 2022-10-31 NOTE — ED Provider Notes (Signed)
[place] EMERGENCY DEPARTMENT AT [facility] Provider Note   CSN: [id number] Arrival date & time: 10/31/22  1734     History  Chief Complaint  Patient presents with   Toe Pain    Renee Herrera is a 47 y.o. female.  47 year old female presents with pain between her right fourth and fifth toe.  States she stands around at work and has had increasing moisture in her feet.  Denies any fever or chills.  Slight URI symptoms.  No drainage noted.       Home Medications Prior to Admission medications   Medication Sig Start Date End Date Taking? Authorizing Provider  cetirizine (ZYRTEC) 10 MG tablet Take 1 tablet (10 mg total) by mouth daily. 04/01/22   Carlisle Beers, FNP  tiZANidine (ZANAFLEX) 4 MG tablet Take 1 tablet (4 mg total) by mouth every 6 (six) hours as needed for muscle spasms. 04/01/22   Carlisle Beers, FNP      Allergies    Patient has no known allergies.    Review of Systems   Review of Systems  All other systems reviewed and are negative.   Physical Exam Updated Vital Signs BP (!) 120/98 (BP Location: Right Arm)   Pulse 81   Temp (!) 100.6 F (38.1 C) (Oral)   Resp 16   SpO2 100%  Physical Exam Vitals and nursing note reviewed.  Constitutional:      General: She is not in acute distress.    Appearance: Normal appearance. She is well-developed. She is not toxic-appearing.  HENT:     Head: Normocephalic and atraumatic.  Eyes:     General: Lids are normal.     Conjunctiva/sclera: Conjunctivae normal.     Pupils: Pupils are equal, round, and reactive to light.  Neck:     Thyroid: No thyroid mass.     Trachea: No tracheal deviation.  Cardiovascular:     Rate and Rhythm: Normal rate and regular rhythm.     Heart sounds: Normal heart sounds. No murmur heard.    No gallop.  Pulmonary:     Effort: Pulmonary effort is normal. No respiratory distress.     Breath sounds: Normal breath sounds. No stridor. No decreased breath sounds,  wheezing, rhonchi or rales.  Abdominal:     General: There is no distension.     Palpations: Abdomen is soft.     Tenderness: There is no abdominal tenderness. There is no rebound.  Musculoskeletal:        General: No tenderness. Normal range of motion.     Cervical back: Normal range of motion and neck supple.  Feet:     Comments: Evidence of cellulitis.  No deformities noted. Skin:    General: Skin is warm and dry.     Findings: No abrasion or rash.  Neurological:     Mental Status: She is alert and oriented to person, place, and time. Mental status is at baseline.     GCS: GCS eye subscore is 4. GCS verbal subscore is 5. GCS motor subscore is 6.     Cranial Nerves: No cranial nerve deficit.     Sensory: No sensory deficit.     Motor: Motor function is intact.  Psychiatric:        Attention and Perception: Attention normal.        Speech: Speech normal.        Behavior: Behavior normal.     ED Results / Procedures / Treatments  Labs (all labs ordered are listed, but only abnormal results are displayed) Labs Reviewed - No data to display  EKG None  Radiology DG Foot Complete Right  Result Date: 10/31/2022 CLINICAL DATA:  Right fifth digit pain EXAM: RIGHT FOOT COMPLETE - 3+ VIEW COMPARISON:  None Available. FINDINGS: Frontal, oblique, and lateral views of the right foot are obtained. No acute displaced fracture, subluxation, or dislocation. Moderate hallux valgus deformity. Soft tissues swelling throughout the fifth digit. No radiopaque foreign body. IMPRESSION: 1. No acute bony abnormality. 2. Moderate hallux valgus deformity of the first metatarsophalangeal joint. 3. Soft tissue swelling fifth digit.  No radiopaque foreign body. Electronically Signed   By: Sharlet Salina M.D.   On: 10/31/2022 18:46    Procedures Procedures    Medications Ordered in ED Medications  acetaminophen (TYLENOL) tablet 1,000 mg (1,000 mg Oral Given 10/31/22 1822)    ED Course/ Medical  Decision Making/ A&P                             Medical Decision Making Risk OTC drugs.   Patient's x-ray of her right foot per interpretation negative.  She does have some toe fungus.  Instructed her to use foot powder.  Initial temp was 100.6 but repeat without antipyretics was 97.4.  Hospital viral URI.  She has not had any shortness of breath, vomiting, diarrhea.  Will discharge home        Final Clinical Impression(s) / ED Diagnoses Final diagnoses:  None    Rx / DC Orders ED Discharge Orders     None         Lorre Nick, MD 10/31/22 2044

## 2022-10-31 NOTE — ED Triage Notes (Signed)
Pt c/o R pinky toe pain ongoing for several years. Pt without open wound noted. No known trauma.

## 2022-12-22 ENCOUNTER — Other Ambulatory Visit: Payer: Self-pay

## 2022-12-22 ENCOUNTER — Ambulatory Visit (INDEPENDENT_AMBULATORY_CARE_PROVIDER_SITE_OTHER): Payer: Medicaid Other

## 2022-12-22 ENCOUNTER — Emergency Department (HOSPITAL_COMMUNITY): Payer: No Typology Code available for payment source

## 2022-12-22 ENCOUNTER — Ambulatory Visit (HOSPITAL_COMMUNITY)
Admission: EM | Admit: 2022-12-22 | Discharge: 2022-12-22 | Disposition: A | Discharge status: Home / Self Care | Payer: Medicaid Other | Attending: Emergency Medicine | Admitting: Emergency Medicine

## 2022-12-22 ENCOUNTER — Inpatient Hospital Stay (HOSPITAL_COMMUNITY)
Admission: EM | Admit: 2022-12-22 | Discharge: 2022-12-27 | DRG: 540 | Disposition: A | Discharge status: Home / Self Care | Payer: No Typology Code available for payment source | Attending: Internal Medicine | Admitting: Internal Medicine

## 2022-12-22 ENCOUNTER — Encounter (HOSPITAL_COMMUNITY): Payer: Self-pay

## 2022-12-22 DIAGNOSIS — B353 Tinea pedis: Secondary | ICD-10-CM

## 2022-12-22 DIAGNOSIS — F17210 Nicotine dependence, cigarettes, uncomplicated: Secondary | ICD-10-CM | POA: Diagnosis present

## 2022-12-22 DIAGNOSIS — M86171 Other acute osteomyelitis, right ankle and foot: Secondary | ICD-10-CM

## 2022-12-22 DIAGNOSIS — Z8249 Family history of ischemic heart disease and other diseases of the circulatory system: Secondary | ICD-10-CM | POA: Diagnosis not present

## 2022-12-22 DIAGNOSIS — F1910 Other psychoactive substance abuse, uncomplicated: Secondary | ICD-10-CM | POA: Diagnosis present

## 2022-12-22 DIAGNOSIS — L03115 Cellulitis of right lower limb: Secondary | ICD-10-CM | POA: Diagnosis present

## 2022-12-22 DIAGNOSIS — Z810 Family history of intellectual disabilities: Secondary | ICD-10-CM | POA: Diagnosis not present

## 2022-12-22 DIAGNOSIS — M8618 Other acute osteomyelitis, other site: Secondary | ICD-10-CM | POA: Diagnosis not present

## 2022-12-22 DIAGNOSIS — Z809 Family history of malignant neoplasm, unspecified: Secondary | ICD-10-CM | POA: Diagnosis not present

## 2022-12-22 DIAGNOSIS — E663 Overweight: Secondary | ICD-10-CM | POA: Diagnosis present

## 2022-12-22 DIAGNOSIS — L03119 Cellulitis of unspecified part of limb: Secondary | ICD-10-CM | POA: Diagnosis not present

## 2022-12-22 DIAGNOSIS — M869 Osteomyelitis, unspecified: Secondary | ICD-10-CM | POA: Diagnosis present

## 2022-12-22 DIAGNOSIS — Z6825 Body mass index (BMI) 25.0-25.9, adult: Secondary | ICD-10-CM | POA: Diagnosis not present

## 2022-12-22 DIAGNOSIS — Z79899 Other long term (current) drug therapy: Secondary | ICD-10-CM | POA: Diagnosis not present

## 2022-12-22 LAB — CBC WITH DIFFERENTIAL/PLATELET
Abs Immature Granulocytes: 0.02 10*3/uL (ref 0.00–0.07)
Basophils Absolute: 0.1 10*3/uL (ref 0.0–0.1)
Basophils Relative: 1 %
Eosinophils Absolute: 0.1 10*3/uL (ref 0.0–0.5)
Eosinophils Relative: 1 %
HCT: 37.5 % (ref 36.0–46.0)
Hemoglobin: 13.6 g/dL (ref 12.0–15.0)
Immature Granulocytes: 0 %
Lymphocytes Relative: 23 %
Lymphs Abs: 2.1 10*3/uL (ref 0.7–4.0)
MCH: 29 pg (ref 26.0–34.0)
MCHC: 36.3 g/dL — ABNORMAL HIGH (ref 30.0–36.0)
MCV: 80 fL (ref 80.0–100.0)
Monocytes Absolute: 0.7 10*3/uL (ref 0.1–1.0)
Monocytes Relative: 8 %
Neutro Abs: 6.1 10*3/uL (ref 1.7–7.7)
Neutrophils Relative %: 67 %
Platelets: 310 10*3/uL (ref 150–400)
RBC: 4.69 MIL/uL (ref 3.87–5.11)
RDW: 13.5 % (ref 11.5–15.5)
WBC: 9.1 10*3/uL (ref 4.0–10.5)
nRBC: 0 % (ref 0.0–0.2)

## 2022-12-22 LAB — COMPREHENSIVE METABOLIC PANEL
ALT: 10 U/L (ref 0–44)
AST: 14 U/L — ABNORMAL LOW (ref 15–41)
Albumin: 3.7 g/dL (ref 3.5–5.0)
Alkaline Phosphatase: 65 U/L (ref 38–126)
Anion gap: 11 (ref 5–15)
BUN: 12 mg/dL (ref 6–20)
CO2: 25 mmol/L (ref 22–32)
Calcium: 9.5 mg/dL (ref 8.9–10.3)
Chloride: 101 mmol/L (ref 98–111)
Creatinine, Ser: 1.08 mg/dL — ABNORMAL HIGH (ref 0.44–1.00)
GFR, Estimated: >60 mL/min (ref >60)
Glucose, Bld: 102 mg/dL — ABNORMAL HIGH (ref 70–99)
Potassium: 3.5 mmol/L (ref 3.5–5.1)
Sodium: 137 mmol/L (ref 135–145)
Total Bilirubin: 0.5 mg/dL (ref 0.3–1.2)
Total Protein: 6.8 g/dL (ref 6.5–8.1)

## 2022-12-22 LAB — HCG, QUANTITATIVE, PREGNANCY: hCG, Beta Chain, Quant, S: 2 m[IU]/mL (ref <5)

## 2022-12-22 LAB — URINALYSIS, ROUTINE W REFLEX MICROSCOPIC
Bilirubin Urine: NEGATIVE
Glucose, UA: NEGATIVE mg/dL
Ketones, ur: NEGATIVE mg/dL
Nitrite: NEGATIVE
Protein, ur: NEGATIVE mg/dL
Specific Gravity, Urine: 1.023 (ref 1.005–1.030)
pH: 5 (ref 5.0–8.0)

## 2022-12-22 LAB — C-REACTIVE PROTEIN: CRP: 1.9 mg/dL — ABNORMAL HIGH (ref <1.0)

## 2022-12-22 LAB — LACTIC ACID, PLASMA: Lactic Acid, Venous: 0.7 mmol/L (ref 0.5–1.9)

## 2022-12-22 LAB — SEDIMENTATION RATE: Sed Rate: 12 mm/hr (ref 0–22)

## 2022-12-22 MED ORDER — SODIUM CHLORIDE 0.9 % IV SOLN
2.0000 g | Freq: Three times a day (TID) | INTRAVENOUS | Status: DC
  Administered 2022-12-22 – 2022-12-23 (×2): 2 g via INTRAVENOUS
  Filled 2022-12-22 (×2): qty 12.5

## 2022-12-22 MED ORDER — ENOXAPARIN SODIUM 40 MG/0.4ML IJ SOSY
40.0000 mg | PREFILLED_SYRINGE | INTRAMUSCULAR | Status: DC
  Administered 2022-12-23 – 2022-12-26 (×4): 40 mg via SUBCUTANEOUS
  Filled 2022-12-22 (×4): qty 0.4

## 2022-12-22 MED ORDER — MELATONIN 5 MG PO TABS: 5.0000 mg | ORAL_TABLET | Freq: Every evening | ORAL | Status: DC | PRN

## 2022-12-22 MED ORDER — CEPHALEXIN 500 MG PO CAPS: 1000.0000 mg | ORAL_CAPSULE | Freq: Two times a day (BID) | ORAL | 0 refills | Status: DC

## 2022-12-22 MED ORDER — ACETAMINOPHEN 325 MG PO TABS
650.0000 mg | ORAL_TABLET | Freq: Four times a day (QID) | ORAL | Status: DC | PRN
  Administered 2022-12-25 – 2022-12-26 (×3): 650 mg via ORAL
  Filled 2022-12-22 (×3): qty 2

## 2022-12-22 MED ORDER — VANCOMYCIN HCL 1500 MG/300ML IV SOLN
1500.0000 mg | Freq: Once | INTRAVENOUS | Status: AC
  Administered 2022-12-22: 1500 mg via INTRAVENOUS
  Filled 2022-12-22: qty 300

## 2022-12-22 MED ORDER — HYDROMORPHONE HCL 1 MG/ML IJ SOLN
0.5000 mg | INTRAMUSCULAR | Status: AC | PRN
  Administered 2022-12-23 – 2022-12-24 (×4): 0.5 mg via INTRAVENOUS
  Filled 2022-12-22 (×4): qty 0.5

## 2022-12-22 MED ORDER — OXYCODONE HCL 5 MG PO TABS
5.0000 mg | ORAL_TABLET | Freq: Four times a day (QID) | ORAL | Status: DC | PRN
  Administered 2022-12-23 – 2022-12-27 (×13): 5 mg via ORAL
  Filled 2022-12-22 (×13): qty 1

## 2022-12-22 MED ORDER — SODIUM CHLORIDE 0.9 % IV SOLN: 2.0000 g | INTRAVENOUS | Status: DC

## 2022-12-22 MED ORDER — LACTATED RINGERS IV BOLUS
1000.0000 mL | Freq: Once | INTRAVENOUS | Status: AC
  Administered 2022-12-22: 1000 mL via INTRAVENOUS

## 2022-12-22 MED ORDER — METRONIDAZOLE 500 MG/100ML IV SOLN: 500.0000 mg | Freq: Two times a day (BID) | INTRAVENOUS | Status: DC

## 2022-12-22 MED ORDER — PROCHLORPERAZINE EDISYLATE 10 MG/2ML IJ SOLN: 5.0000 mg | Freq: Four times a day (QID) | INTRAMUSCULAR | Status: DC | PRN

## 2022-12-22 MED ORDER — IBUPROFEN 600 MG PO TABS: 600.0000 mg | ORAL_TABLET | Freq: Four times a day (QID) | ORAL | 0 refills | Status: DC | PRN

## 2022-12-22 MED ORDER — KETOCONAZOLE 2 % EX CREA: 1.0000 | TOPICAL_CREAM | Freq: Every day | CUTANEOUS | 1 refills | Status: DC

## 2022-12-22 MED ORDER — POLYETHYLENE GLYCOL 3350 17 G PO PACK: 17.0000 g | PACK | Freq: Every day | ORAL | Status: DC | PRN

## 2022-12-22 MED ORDER — DOXYCYCLINE HYCLATE 100 MG PO CAPS: 100.0000 mg | ORAL_CAPSULE | Freq: Two times a day (BID) | ORAL | 0 refills | Status: DC

## 2022-12-22 MED ORDER — VANCOMYCIN HCL 1500 MG/300ML IV SOLN
1500.0000 mg | INTRAVENOUS | Status: DC
  Administered 2022-12-23 – 2022-12-26 (×4): 1500 mg via INTRAVENOUS
  Filled 2022-12-22 (×4): qty 300

## 2022-12-22 NOTE — Discharge Instructions (Addendum)
Your x-ray was concerning for bone infection.  Please go to the emergency department for further evaluation.  You may need IV antibiotics and/or admission.    If they send you home, I have already prescribed Keflex and doxycycline.  Keep this clean with soap and water.  May take ibuprofen combined with 1000 mg of Tylenol 3-4 times a day as needed for pain.  Apply the ketoconazole cream twice a day for 4 weeks or 4-week until after this has healed.  I have low put in a referral to podiatry.  Please follow-up with them ASAP.  Below is a list of primary care practices who are taking new patients for you to follow-up with.  [facility] -multiple locations.  See website at [email or web address]  [facility] internal medicine clinic Ground Floor - [facility], [address] [place], [place], [place] [address] [phone]  [facility] Primary Care at [facility] [address] [place], [place] [address] [phone]  [facility]- will see patients with no insurance [address] [place], [place] [address] [place], [place] [address] [phone]  [facility] [phone] [address] [place] [address]  [facility]: [address] [place] [address]  [phone]  [facility]: [address] [place] [address]  [phone]  [facility] : [address] [place] [address] [phone]  [facility]: [address] [place] [address] [phone]  [facility]: [address] [place] [address] [phone]  Dr. Oneal Grout [address] [place]  [facility]- will see patients with no insurance. [address], [place], [place] [address] [phone]  Go to www.goodrx.com  or  www.costplusdrugs.com to look up your medications. This will give you a list of where you can find your prescriptions at the most affordable prices. Or ask the pharmacist what the cash price is, or if they have any other discount programs available to help make your medication more affordable. This can be less expensive than what you would pay with insurance.

## 2022-12-22 NOTE — ED Triage Notes (Signed)
Pt presents with R-foot and toe pain x 3 days. Denies any falls or trauma to her foot.

## 2022-12-22 NOTE — H&P (Incomplete)
History and Physical  Renee Herrera:[id number] DOB: 16-Nov-1975 DOA: 12/22/2022  Referring physician: Deatra Robinson PA-EDP  PCP: Pcp, No  Outpatient Specialists: None Patient coming from: Home  Chief Complaint: Right Foot pain.  HPI: Renee Herrera is a 47 y.o. female with no significant medical history who presented with several weeks of right foot pain.  Mainly involving a wound between her fourth and fifth right toes.  She initially presented to the ED in May, was thought to have a foot fungal infection.  Her symptoms did not improve with outpatient management.  She continued to have pain in her right foot, worsened with walking.  For the past 24 hours she noted that the dorsum part of her right foot was swollen, erythematous, tender and warm to the touch.  She presented to urgent care today where an x-ray of her right foot was done.  It revealed findings concerning for possible osteomyelitis of the fifth toe.  The patient was sent to the ED for further evaluation and management of R foot cellulitis and possible 5th toe osteomyelitis.  In the ED, noted to have a low-grade fever 99.5.  No leukocytosis.  Peripheral blood cultures x 2 were obtained.  The patient was started on broad-spectrum IV antibiotics IV vancomycin and cefepime.  Additionally, the patient received 1 L IV fluid bolus LR x1.    Right foot x-ray 12/22/2022 revealed the following findings:  1. Moderate fifth and mild-to-moderate fourth toe soft tissue swelling appears similar to 10/31/2022. 2. New 1.5 mm lucency at the distal medial aspect of the proximal phalanx of the fifth toe may represent a new cortical erosion. Given the fifth toe soft tissue swelling, recommend clinical correlation for possible posttraumatic change versus possible very early osteomyelitis. 3. Moderate hallux valgus.  [facility], hospitalist service, was asked to admit.  EDP discussed the case with orthopedic surgery, Dr. Steward Drone, was consulted by EDP.  No  urgent debridement needed per orthopedic surgery.  The patient was admitted to MedSurg unit as inpatient status for Right foot cellulitis and suspected right fifth toe osteomyelitis.  ED Course: Tmax 97.7.  BP 150/95, pulse 70, respiratory 18, O2 saturation 100% on room air.  Lab studies remarkable for serum glucose 102, creatinine 1.08 with GFR greater than 60.  Rest of labs were unremarkable.  Review of Systems: Review of systems as noted in the HPI. All other systems reviewed and are negative.   Past Medical History:  Diagnosis Date   Anxiety    Depression    Fibroid    Hernia, umbilical    Past Surgical History:  Procedure Laterality Date   CESAREAN SECTION     TUBAL LIGATION      Social History:  reports that she has been smoking cigarettes. She has been smoking an average of .5 packs per day. She has never used smokeless tobacco. She reports current alcohol use. She reports current drug use. Drug: Marijuana.   No Known Allergies  Family History  Problem Relation Age of Onset   Mental retardation Father    Cancer Other    Hypertension Other       Prior to Admission medications   Medication Sig Start Date End Date Taking? Authorizing Provider  cephALEXin (KEFLEX) 500 MG capsule Take 2 capsules (1,000 mg total) by mouth 2 (two) times daily for 7 days. 12/22/22 12/29/22  Renee Gong, MD  doxycycline (VIBRAMYCIN) 100 MG capsule Take 1 capsule (100 mg total) by mouth 2 (two) times daily for 7  days. 12/22/22 12/29/22  Renee Gong, MD  ibuprofen (ADVIL) 600 MG tablet Take 1 tablet (600 mg total) by mouth every 6 (six) hours as needed. 12/22/22   Renee Gong, MD  ketoconazole (NIZORAL) 2 % cream Apply 1 Application topically daily. Apply bid until rash resolved and for 1 week after 12/22/22   Renee Gong, MD    Physical Exam: BP (!) 148/97   Pulse 61   Temp 99.5 F (37.5 C) (Oral)   Resp 18   Ht 5\' 6"  (1.676 m)   Wt 73 kg   LMP 12/20/2022 (Approximate)    SpO2 98%   BMI 25.98 kg/m   General: 47 y.o. year-old female well developed well nourished in no acute distress.  Alert and oriented x3. Cardiovascular: Regular rate and rhythm with no rubs or gallops.  No thyromegaly or JVD noted.  No lower extremity edema. 2/4 pulses in all 4 extremities. Respiratory: Clear to auscultation with no wheezes or rales. Good inspiratory effort. Abdomen: Soft nontender nondistended with normal bowel sounds x4 quadrants. Muskuloskeletal: No cyanosis, clubbing or edema noted bilaterally Neuro: CN II-XII intact, strength, sensation, reflexes  Right foot 12/22/2022. Psychiatry: Judgement and insight appear normal. Mood is appropriate for condition and setting          Labs on Admission:  Basic Metabolic Panel: Recent Labs  Lab 12/22/22 2020  NA 137  K 3.5  CL 101  CO2 25  GLUCOSE 102*  BUN 12  CREATININE 1.08*  CALCIUM 9.5   Liver Function Tests: Recent Labs  Lab 12/22/22 2020  AST 14*  ALT 10  ALKPHOS 65  BILITOT 0.5  PROT 6.8  ALBUMIN 3.7   No results for input(s): "LIPASE", "AMYLASE" in the last 168 hours. No results for input(s): "AMMONIA" in the last 168 hours. CBC: Recent Labs  Lab 12/22/22 2020  WBC 9.1  NEUTROABS 6.1  HGB 13.6  HCT 37.5  MCV 80.0  PLT 310   Cardiac Enzymes: No results for input(s): "CKTOTAL", "CKMB", "CKMBINDEX", "TROPONINI" in the last 168 hours.  BNP (last 3 results) No results for input(s): "BNP" in the last 8760 hours.  ProBNP (last 3 results) No results for input(s): "PROBNP" in the last 8760 hours.  CBG: No results for input(s): "GLUCAP" in the last 168 hours.  Radiological Exams on Admission: DG Chest 2 View  Result Date: 12/22/2022 CLINICAL DATA:  Edema/infection EXAM: CHEST - 2 VIEW COMPARISON:  07/22/2018 FINDINGS: Lungs are clear.  No pleural effusion or pneumothorax. The heart is normal in size. Visualized osseous structures are within normal limits. IMPRESSION: Normal chest  radiographs. Electronically Signed   By: Charline Bills M.D.   On: 12/22/2022 20:48   DG Foot Complete Right  Result Date: 12/22/2022 CLINICAL DATA:  Pain between the fourth and fifth toes for 3 days. Diffuse dorsal swelling. Evaluate for osteomyelitis. I for metatarsophalangeal fracture of the fifth toe. No known injury. EXAM: RIGHT FOOT COMPLETE - 3+ VIEW COMPARISON:  Right foot radiographs 10/31/2022 FINDINGS: Moderate fifth and mild-to-moderate fourth toe soft tissue swelling appears similar to 10/31/2022. On oblique view there is a 1.5 mm lucency at the distal medial aspect of the proximal phalanx of the fifth toe that appears new from 10/31/2022, although slight differences in projection may be why this is not seen on the prior study. A new cortical erosion in the setting of fifth toe soft tissue swelling is possible. No subcutaneous air. Moderate hallux valgus is unchanged. No acute fracture or dislocation. IMPRESSION:  1. Moderate fifth and mild-to-moderate fourth toe soft tissue swelling appears similar to 10/31/2022. 2. New 1.5 mm lucency at the distal medial aspect of the proximal phalanx of the fifth toe may represent a new cortical erosion. Given the fifth toe soft tissue swelling, recommend clinical correlation for possible posttraumatic change versus possible very early osteomyelitis. 3. Moderate hallux valgus. Electronically Signed   By: Neita Garnet M.D.   On: 12/22/2022 18:57    EKG: I independently viewed the EKG done and my findings are as followed: None available at the time of this visit.  Assessment/Plan Present on Admission:  Osteomyelitis (HCC)  Principal Problem:   Osteomyelitis (HCC)  Right foot dorsum cellulitis and suspected right fifth toe osteomyelitis, poa Orthopedic surgery consulted for possible debridement Obtain MRI right foot to confirm suspicion for osteomyelitis, if requested by orthopedic surgery. Continue empiric IV antibiotics IV vancomycin and  cefepime If debrided, obtain deep wound culture and narrow down antibiotics as able, according to ID and sensitivities. Continue IV fluid hydration Pain control and bowel regimen  Tobacco use disorder Tobacco cessation recommended  Polysubstance abuse Polysubstance cessation recommended   Time: 75 minutes.   DVT prophylaxis: Subcu Lovenox daily  Code Status: Full code  Family Communication: None at bedside  Disposition Plan: Admitted to MedSurg unit  Consults called: Orthopedic surgery  Admission status: Inpatient status.   Status is: Inpatient The patient requires at least 2 midnights for further evaluation and treatment of present condition.   Darlin Drop MD [facility] Pager [phone]  If 7PM-7AM, please contact night-coverage [email or web address] Password [facility]  12/22/2022, 11:27 PM

## 2022-12-22 NOTE — ED Notes (Signed)
ED TO INPATIENT HANDOFF REPORT  ED Nurse Name and Phone #: Manus Gunning, RN   S Name/Age/Gender Renee Herrera 47 y.o. female Room/Bed: RESUSC/RESUSC  Code Status   Code Status: Full Code  Home/SNF/Other Home Patient oriented to: self, place, time, and situation Is this baseline? Yes   Triage Complete: Triage complete  Chief Complaint Osteomyelitis [facility]) [M86.9]  Triage Note Pt arrived from Urgent Care via POV c/o right foot pain. Pt states that Urgent care sent her to ED to continue work up on cellulitis of right foot. 8/10 on pain scale   Allergies No Known Allergies  Level of Care/Admitting Diagnosis ED Disposition     ED Disposition  Admit   Condition  --   Comment  Hospital Area: [facility] [[id number]]  Level of Care: Med-Surg [16]  May admit patient to [facility] or [facility] if equivalent level of care is available:: No  Covid Evaluation: Asymptomatic - no recent exposure (last 10 days) testing not required  Diagnosis: Osteomyelitis [facility]) [[id number]]  Admitting Physician: Darlin Drop [[id number]]  Attending Physician: Darlin Drop [[id number]]  Certification:: I certify this patient will need inpatient services for at least 2 midnights  Estimated Length of Stay: 2          B Medical/Surgery History Past Medical History:  Diagnosis Date   Anxiety    Depression    Fibroid    Hernia, umbilical    Past Surgical History:  Procedure Laterality Date   CESAREAN SECTION     TUBAL LIGATION       A IV Location/Drains/Wounds Patient Lines/Drains/Airways Status     Active Line/Drains/Airways     Name Placement date Placement time Site Days   Peripheral IV 12/22/22 Left Antecubital 12/22/22  2204  Antecubital  less than 1            Intake/Output Last 24 hours No intake or output data in the 24 hours ending 12/22/22 2333  Labs/Imaging Results for orders placed or performed during the hospital encounter of 12/22/22 (from the past  48 hour(s))  Lactic acid, plasma     Status: None   Collection Time: 12/22/22  8:20 PM  Result Value Ref Range   Lactic Acid, Venous 0.7 0.5 - 1.9 mmol/L    Comment: Performed at [facility], [address], [place], [place] [address]  Comprehensive metabolic panel     Status: Abnormal   Collection Time: 12/22/22  8:20 PM  Result Value Ref Range   Sodium 137 135 - 145 mmol/L   Potassium 3.5 3.5 - 5.1 mmol/L   Chloride 101 98 - 111 mmol/L   CO2 25 22 - 32 mmol/L   Glucose, Bld 102 (H) 70 - 99 mg/dL    Comment: Glucose reference range applies only to samples taken after fasting for at least 8 hours.   BUN 12 6 - 20 mg/dL   Creatinine, Ser 1.30 (H) 0.44 - 1.00 mg/dL   Calcium 9.5 8.9 - 86.5 mg/dL   Total Protein 6.8 6.5 - 8.1 g/dL   Albumin 3.7 3.5 - 5.0 g/dL   AST 14 (L) 15 - 41 U/L   ALT 10 0 - 44 U/L   Alkaline Phosphatase 65 38 - 126 U/L   Total Bilirubin 0.5 0.3 - 1.2 mg/dL   GFR, Estimated >78 >46 mL/min    Comment: (NOTE) Calculated using the CKD-EPI Creatinine Equation (2021)    Anion gap 11 5 - 15  Comment: Performed at [facility], [address], [place], [place] [address]  CBC with Differential     Status: Abnormal   Collection Time: 12/22/22  8:20 PM  Result Value Ref Range   WBC 9.1 4.0 - 10.5 K/uL   RBC 4.69 3.87 - 5.11 MIL/uL   Hemoglobin 13.6 12.0 - 15.0 g/dL   HCT 60.4 54.0 - 98.1 %   MCV 80.0 80.0 - 100.0 fL   MCH 29.0 26.0 - 34.0 pg   MCHC 36.3 (H) 30.0 - 36.0 g/dL   RDW 19.1 47.8 - 29.5 %   Platelets 310 150 - 400 K/uL   nRBC 0.0 0.0 - 0.2 %   Neutrophils Relative % 67 %   Neutro Abs 6.1 1.7 - 7.7 K/uL   Lymphocytes Relative 23 %   Lymphs Abs 2.1 0.7 - 4.0 K/uL   Monocytes Relative 8 %   Monocytes Absolute 0.7 0.1 - 1.0 K/uL   Eosinophils Relative 1 %   Eosinophils Absolute 0.1 0.0 - 0.5 K/uL   Basophils Relative 1 %   Basophils Absolute 0.1 0.0 - 0.1 K/uL   Immature Granulocytes 0 %   Abs Immature Granulocytes 0.02 0.00 - 0.07  K/uL    Comment: Performed at [facility], [address], [place], [place] [address]  Urinalysis, Routine w reflex microscopic -Urine, Clean Catch     Status: Abnormal   Collection Time: 12/22/22  8:20 PM  Result Value Ref Range   Color, Urine YELLOW YELLOW   APPearance HAZY (A) CLEAR   Specific Gravity, Urine 1.023 1.005 - 1.030   pH 5.0 5.0 - 8.0   Glucose, UA NEGATIVE NEGATIVE mg/dL   Hgb urine dipstick MODERATE (A) NEGATIVE   Bilirubin Urine NEGATIVE NEGATIVE   Ketones, ur NEGATIVE NEGATIVE mg/dL   Protein, ur NEGATIVE NEGATIVE mg/dL   Nitrite NEGATIVE NEGATIVE   Leukocytes,Ua MODERATE (A) NEGATIVE   RBC / HPF 11-20 0 - 5 RBC/hpf   WBC, UA 21-50 0 - 5 WBC/hpf   Bacteria, UA FEW (A) NONE SEEN   Squamous Epithelial / HPF 6-10 0 - 5 /HPF   Mucus PRESENT     Comment: Performed at [facility], [address] [place], [place] [address]  hCG, quantitative, pregnancy     Status: None   Collection Time: 12/22/22  8:20 PM  Result Value Ref Range   hCG, Beta Chain, Quant, S 2 <5 mIU/mL    Comment:          GEST. AGE      CONC.  (mIU/mL)   <=1 WEEK        5 - 50     2 WEEKS       50 - 500     3 WEEKS       100 - 10,000     4 WEEKS     1,000 - 30,000     5 WEEKS     3,500 - 115,000   6-8 WEEKS     12,000 - 270,000    12 WEEKS     15,000 - 220,000        FEMALE AND NON-PREGNANT FEMALE:     LESS THAN 5 mIU/mL Performed at [facility], [address], [place], [place] [address]   C-reactive protein     Status: Abnormal   Collection Time: 12/22/22 10:04 PM  Result Value Ref Range   CRP 1.9 (H) <1.0 mg/dL  Comment: Performed at [facility], [address] [place], [place] [address]   DG Chest 2 View  Result Date: 12/22/2022 CLINICAL DATA:  Edema/infection EXAM: CHEST - 2 VIEW COMPARISON:  07/22/2018 FINDINGS: Lungs are clear.  No pleural effusion or pneumothorax. The heart is normal in size. Visualized osseous structures are within normal limits.  IMPRESSION: Normal chest radiographs. Electronically Signed   By: Charline Bills M.D.   On: 12/22/2022 20:48   DG Foot Complete Right  Result Date: 12/22/2022 CLINICAL DATA:  Pain between the fourth and fifth toes for 3 days. Diffuse dorsal swelling. Evaluate for osteomyelitis. I for metatarsophalangeal fracture of the fifth toe. No known injury. EXAM: RIGHT FOOT COMPLETE - 3+ VIEW COMPARISON:  Right foot radiographs 10/31/2022 FINDINGS: Moderate fifth and mild-to-moderate fourth toe soft tissue swelling appears similar to 10/31/2022. On oblique view there is a 1.5 mm lucency at the distal medial aspect of the proximal phalanx of the fifth toe that appears new from 10/31/2022, although slight differences in projection may be why this is not seen on the prior study. A new cortical erosion in the setting of fifth toe soft tissue swelling is possible. No subcutaneous air. Moderate hallux valgus is unchanged. No acute fracture or dislocation. IMPRESSION: 1. Moderate fifth and mild-to-moderate fourth toe soft tissue swelling appears similar to 10/31/2022. 2. New 1.5 mm lucency at the distal medial aspect of the proximal phalanx of the fifth toe may represent a new cortical erosion. Given the fifth toe soft tissue swelling, recommend clinical correlation for possible posttraumatic change versus possible very early osteomyelitis. 3. Moderate hallux valgus. Electronically Signed   By: Neita Garnet M.D.   On: 12/22/2022 18:57    Pending Labs Unresulted Labs (From admission, onward)     Start     Ordered   12/22/22 2325  HIV Antibody (routine testing w rflx)  (HIV Antibody (Routine testing w reflex) panel)  Once,   R        12/22/22 2325   12/22/22 2140  Sedimentation rate  Once,   URGENT        12/22/22 2139   12/22/22 2124  Blood Cultures x 2 sites  BLOOD CULTURE X 2,   STAT      12/22/22 2134            Vitals/Pain Today's Vitals   12/22/22 2012 12/22/22 2012 12/22/22 2200 12/22/22 2312  BP:  (!) 134/94 (!) 134/94  (!) 148/97  Pulse: 75 73  61  Resp: 16 20  18   Temp: 99.5 F (37.5 C) 99.5 F (37.5 C)    TempSrc: Oral Oral    SpO2: 99% 99%  98%  Weight:   160 lb 15 oz (73 kg)   Height:   5\' 6"  (1.676 m)   PainSc:        Isolation Precautions No active isolations  Medications Medications  vancomycin (VANCOREADY) IVPB 1500 mg/300 mL (1,500 mg Intravenous New Bag/Given 12/22/22 2238)  ceFEPIme (MAXIPIME) 2 g in sodium chloride 0.9 % 100 mL IVPB (0 g Intravenous Stopped 12/22/22 2310)  vancomycin (VANCOREADY) IVPB 1500 mg/300 mL (has no administration in time range)  acetaminophen (TYLENOL) tablet 650 mg (has no administration in time range)  oxyCODONE (Oxy IR/ROXICODONE) immediate release tablet 5 mg (has no administration in time range)  HYDROmorphone (DILAUDID) injection 0.5 mg (has no administration in time range)  polyethylene glycol (MIRALAX / GLYCOLAX) packet 17 g (has no administration in time range)  prochlorperazine (  COMPAZINE) injection 5 mg (has no administration in time range)  melatonin tablet 5 mg (has no administration in time range)  enoxaparin (LOVENOX) injection 40 mg (has no administration in time range)  lactated ringers bolus 1,000 mL (1,000 mLs Intravenous New Bag/Given 12/22/22 2236)    Mobility walks     Focused Assessments     R Recommendations: See Admitting Provider Note  Report given to:   Additional Notes:  Previous encounter to urgent care showed osteomyelitis in right foot. Pain ongoing for > 1 year. Alert, oriented, and ambulatory at baseline. NKA. Received 1L LR, cefepime, and vancomycin in ED.

## 2022-12-22 NOTE — ED Provider Notes (Signed)
HPI  SUBJECTIVE:  Renee Herrera is a 47 y.o. female who presents with hypersensitivity, erythema, swelling on the dorsum of her right foot starting this morning.  She reports several months of achy, burning pain between her fourth and fifth toes. Patient presented to the ED on 5/9 with pain between her right fourth and fifth toe, thought to have tinea pedis and was instructed to use foot powder.  She has been using an unknown skin cream and topical Benadryl without improvement in her symptoms.  Symptoms are worse when she walks or when she brushes up against things.  No fevers, trauma to the foot, prolonged exposure to wet environments.  She states that her little toe feels broken, and has pain with little toe movement.  No history of diabetes, MRSA, neuropathy or gout.  LMP: 2 days ago.  Denies possibility being pregnant.  PCP: None.  Past Medical History:  Diagnosis Date   Anxiety    Depression    Fibroid    Hernia, umbilical     Past Surgical History:  Procedure Laterality Date   CESAREAN SECTION     TUBAL LIGATION      Family History  Problem Relation Age of Onset   Mental retardation Father    Cancer Other    Hypertension Other     Social History   Tobacco Use   Smoking status: Every Day    Packs/day: .5    Types: Cigarettes   Smokeless tobacco: Never  Vaping Use   Vaping Use: Never used  Substance Use Topics   Alcohol use: Yes    Comment: on weekends    Drug use: Yes    Types: Marijuana    Comment: "X pill" on Saturday 09/25/19    No current facility-administered medications for this encounter.  Current Outpatient Medications:    cephALEXin (KEFLEX) 500 MG capsule, Take 2 capsules (1,000 mg total) by mouth 2 (two) times daily for 7 days., Disp: 28 capsule, Rfl: 0   doxycycline (VIBRAMYCIN) 100 MG capsule, Take 1 capsule (100 mg total) by mouth 2 (two) times daily for 7 days., Disp: 14 capsule, Rfl: 0   ibuprofen (ADVIL) 600 MG tablet, Take 1 tablet (600 mg total)  by mouth every 6 (six) hours as needed., Disp: 30 tablet, Rfl: 0   ketoconazole (NIZORAL) 2 % cream, Apply 1 Application topically daily. Apply bid until rash resolved and for 1 week after, Disp: 30 g, Rfl: 1  No Known Allergies   ROS  As noted in HPI.   Physical Exam  BP 131/78 (BP Location: Left Arm)   Pulse 92   Temp 98.1 F (36.7 C) (Oral)   Resp 18   SpO2 97%   Constitutional: Well developed, well nourished, no acute distress Eyes:  EOMI, conjunctiva normal bilaterally HENT: Normocephalic, atraumatic,mucus membranes moist Respiratory: Normal inspiratory effort Cardiovascular: Normal rate GI: nondistended skin: No rash, skin intact Musculoskeletal: Diffuse swelling, erythema over the lateral dorsum of the right foot.  Extensive fissuring with odorous white material between fourth and fifth toe.  Fifth toe erythematous, tender.  No tenderness at the base of the fifth metatarsal.  No other toe tenderness.  DP 2+.         Neurologic: Alert & oriented x 3, no focal neuro deficits Psychiatric: Speech and behavior appropriate   ED Course   Medications - No data to display  Orders Placed This Encounter  Procedures   DG Foot Complete Right    Standing Status:  Standing    Number of Occurrences:   1    Order Specific Question:   Reason for Exam (SYMPTOM  OR DIAGNOSIS REQUIRED)    Answer:   Pain between fourth and fifth toes.  Diffuse dorsal swelling.  Rule out osteomyelitis, metatarsal or phalangeal fracture fifth toe   Ambulatory referral to Podiatry    Referral Priority:   Routine    Referral Type:   Consultation    Referral Reason:   Specialty Services Required    Requested Specialty:   Podiatry    Number of Visits Requested:   1    No results found for this or any previous visit (from the past 24 hour(s)). DG Foot Complete Right  Result Date: 12/22/2022 CLINICAL DATA:  Pain between the fourth and fifth toes for 3 days. Diffuse dorsal swelling. Evaluate  for osteomyelitis. I for metatarsophalangeal fracture of the fifth toe. No known injury. EXAM: RIGHT FOOT COMPLETE - 3+ VIEW COMPARISON:  Right foot radiographs 10/31/2022 FINDINGS: Moderate fifth and mild-to-moderate fourth toe soft tissue swelling appears similar to 10/31/2022. On oblique view there is a 1.5 mm lucency at the distal medial aspect of the proximal phalanx of the fifth toe that appears new from 10/31/2022, although slight differences in projection may be why this is not seen on the prior study. A new cortical erosion in the setting of fifth toe soft tissue swelling is possible. No subcutaneous air. Moderate hallux valgus is unchanged. No acute fracture or dislocation. IMPRESSION: 1. Moderate fifth and mild-to-moderate fourth toe soft tissue swelling appears similar to 10/31/2022. 2. New 1.5 mm lucency at the distal medial aspect of the proximal phalanx of the fifth toe may represent a new cortical erosion. Given the fifth toe soft tissue swelling, recommend clinical correlation for possible posttraumatic change versus possible very early osteomyelitis. 3. Moderate hallux valgus. Electronically Signed   By: Neita Garnet M.D.   On: 12/22/2022 18:57    ED Clinical Impression  1. Cellulitis of foot   2. Tinea pedis of right foot   3. Other acute osteomyelitis of right foot [facility])      ED Assessment/Plan     ER records reviewed.  As noted in HPI.  Will x-ray foot to evaluate for osteomyelitis, fracture.    X-ray concerning for osteomyelitis.  Transferring down to the emergency department for labs and possible advanced imaging to confirm provisional diagnosis of osteomyelitis.  I have already prescribed pain medication, doxycycline and Keflex, and have put in a podiatry referral in case she goes home.  Discussed rationale for transfer to the emergency department with the patient.  Discussed importance of going now for further evaluation.  She agrees to go.  Meds ordered this encounter   Medications   cephALEXin (KEFLEX) 500 MG capsule    Sig: Take 2 capsules (1,000 mg total) by mouth 2 (two) times daily for 7 days.    Dispense:  28 capsule    Refill:  0   doxycycline (VIBRAMYCIN) 100 MG capsule    Sig: Take 1 capsule (100 mg total) by mouth 2 (two) times daily for 7 days.    Dispense:  14 capsule    Refill:  0   ketoconazole (NIZORAL) 2 % cream    Sig: Apply 1 Application topically daily. Apply bid until rash resolved and for 1 week after    Dispense:  30 g    Refill:  1   ibuprofen (ADVIL) 600 MG tablet    Sig: Take  1 tablet (600 mg total) by mouth every 6 (six) hours as needed.    Dispense:  30 tablet    Refill:  0      *This clinic note was created using Scientist, clinical (histocompatibility and immunogenetics). Therefore, there may be occasional mistakes despite careful proofreading.  ?    Domenick Gong, MD 12/22/22 1911

## 2022-12-22 NOTE — ED Provider Notes (Signed)
[place] [facility] AT [facility] Provider Note   CSN: [id number] Arrival date & time: 12/22/22  1939     History  Chief Complaint  Patient presents with   Foot Pain    Renee Herrera is a 47 y.o. female with history of depression, fibroids, anxiety who presents the emergency department complaining of right foot pain.  She was sent over from urgent care for further evaluation of cellulitis.  She states that she has had several months of an aching burning pain between her fourth and fifth toes on the right foot.  She initially presented to the ER at the beginning of May, and the infection was thought to be fungal.  She was instructed to use foot powder.  She has been trying a skin cream and topical Benadryl without relief.  She states that symptoms are worse when she walks or the foot brushes up against something.  Denies any fever.  She feels as though her little toe is broken, has pain with any movement.  No history of diabetes, MRSA, neuropathy, or gout.   Foot Pain       Home Medications Prior to Admission medications   Medication Sig Start Date End Date Taking? Authorizing Provider  cephALEXin (KEFLEX) 500 MG capsule Take 2 capsules (1,000 mg total) by mouth 2 (two) times daily for 7 days. 12/22/22 12/29/22  Domenick Gong, MD  doxycycline (VIBRAMYCIN) 100 MG capsule Take 1 capsule (100 mg total) by mouth 2 (two) times daily for 7 days. 12/22/22 12/29/22  Domenick Gong, MD  ibuprofen (ADVIL) 600 MG tablet Take 1 tablet (600 mg total) by mouth every 6 (six) hours as needed. 12/22/22   Domenick Gong, MD  ketoconazole (NIZORAL) 2 % cream Apply 1 Application topically daily. Apply bid until rash resolved and for 1 week after 12/22/22   Domenick Gong, MD      Allergies    Patient has no known allergies.    Review of Systems   Review of Systems  Physical Exam Updated Vital Signs BP (!) 148/97   Pulse 61   Temp 99.5 F (37.5 C) (Oral)   Resp 18    Ht 5\' 6"  (1.676 m)   Wt 73 kg   LMP 12/20/2022 (Approximate)   SpO2 98%   BMI 25.98 kg/m  Physical Exam Vitals and nursing note reviewed.  Constitutional:      Appearance: Normal appearance.  HENT:     Head: Normocephalic and atraumatic.  Eyes:     Conjunctiva/sclera: Conjunctivae normal.  Pulmonary:     Effort: Pulmonary effort is normal. No respiratory distress.  Feet:     Comments: Significant erythema and edema of the right foot, with tissue breakdown between the fourth and fifth digits.  Wet in appearance.  Significant tenderness. Skin:    General: Skin is warm and dry.  Neurological:     Mental Status: She is alert.  Psychiatric:        Mood and Affect: Mood normal.        Behavior: Behavior normal.    (Photo from urgent care visit just prior to ER arrival)  ED Results / Procedures / Treatments   Labs (all labs ordered are listed, but only abnormal results are displayed) Labs Reviewed  COMPREHENSIVE METABOLIC PANEL - Abnormal; Notable for the following components:      Result Value   Glucose, Bld 102 (*)    Creatinine, Ser 1.08 (*)    AST 14 (*)  All other components within normal limits  CBC WITH DIFFERENTIAL/PLATELET - Abnormal; Notable for the following components:   MCHC 36.3 (*)    All other components within normal limits  URINALYSIS, ROUTINE W REFLEX MICROSCOPIC - Abnormal; Notable for the following components:   APPearance HAZY (*)    Hgb urine dipstick MODERATE (*)    Leukocytes,Ua MODERATE (*)    Bacteria, UA FEW (*)    All other components within normal limits  C-REACTIVE PROTEIN - Abnormal; Notable for the following components:   CRP 1.9 (*)    All other components within normal limits  CULTURE, BLOOD (ROUTINE X 2)  CULTURE, BLOOD (ROUTINE X 2)  LACTIC ACID, PLASMA  HCG, QUANTITATIVE, PREGNANCY  SEDIMENTATION RATE  HIV ANTIBODY (ROUTINE TESTING W REFLEX)    EKG None  Radiology DG Chest 2 View  Result Date: 12/22/2022 CLINICAL  DATA:  Edema/infection EXAM: CHEST - 2 VIEW COMPARISON:  07/22/2018 FINDINGS: Lungs are clear.  No pleural effusion or pneumothorax. The heart is normal in size. Visualized osseous structures are within normal limits. IMPRESSION: Normal chest radiographs. Electronically Signed   By: Charline Bills M.D.   On: 12/22/2022 20:48   DG Foot Complete Right  Result Date: 12/22/2022 CLINICAL DATA:  Pain between the fourth and fifth toes for 3 days. Diffuse dorsal swelling. Evaluate for osteomyelitis. I for metatarsophalangeal fracture of the fifth toe. No known injury. EXAM: RIGHT FOOT COMPLETE - 3+ VIEW COMPARISON:  Right foot radiographs 10/31/2022 FINDINGS: Moderate fifth and mild-to-moderate fourth toe soft tissue swelling appears similar to 10/31/2022. On oblique view there is a 1.5 mm lucency at the distal medial aspect of the proximal phalanx of the fifth toe that appears new from 10/31/2022, although slight differences in projection may be why this is not seen on the prior study. A new cortical erosion in the setting of fifth toe soft tissue swelling is possible. No subcutaneous air. Moderate hallux valgus is unchanged. No acute fracture or dislocation. IMPRESSION: 1. Moderate fifth and mild-to-moderate fourth toe soft tissue swelling appears similar to 10/31/2022. 2. New 1.5 mm lucency at the distal medial aspect of the proximal phalanx of the fifth toe may represent a new cortical erosion. Given the fifth toe soft tissue swelling, recommend clinical correlation for possible posttraumatic change versus possible very early osteomyelitis. 3. Moderate hallux valgus. Electronically Signed   By: Neita Garnet M.D.   On: 12/22/2022 18:57    Procedures Procedures    Medications Ordered in ED Medications  vancomycin (VANCOREADY) IVPB 1500 mg/300 mL (1,500 mg Intravenous New Bag/Given 12/22/22 2238)  ceFEPIme (MAXIPIME) 2 g in sodium chloride 0.9 % 100 mL IVPB (0 g Intravenous Stopped 12/22/22 2310)   vancomycin (VANCOREADY) IVPB 1500 mg/300 mL (has no administration in time range)  acetaminophen (TYLENOL) tablet 650 mg (has no administration in time range)  oxyCODONE (Oxy IR/ROXICODONE) immediate release tablet 5 mg (has no administration in time range)  HYDROmorphone (DILAUDID) injection 0.5 mg (has no administration in time range)  polyethylene glycol (MIRALAX / GLYCOLAX) packet 17 g (has no administration in time range)  prochlorperazine (COMPAZINE) injection 5 mg (has no administration in time range)  melatonin tablet 5 mg (has no administration in time range)  lactated ringers bolus 1,000 mL (1,000 mLs Intravenous New Bag/Given 12/22/22 2236)    ED Course/ Medical Decision Making/ A&P Clinical Course as of 12/22/22 2327  Sun Dec 22, 2022  2300 Contacted flow manager as I have not heard back about admission yet.  She did not receive page. Contacted Diplomatic Services operational officer who will page out for admission.  [LR]    Clinical Course User Index [LR] Alonie Gazzola, Lora Paula, PA-C                             Medical Decision Making Amount and/or Complexity of Data Reviewed Labs: ordered. Radiology: ordered.  Risk Prescription drug management. Decision regarding hospitalization.  This patient is a 47 y.o. female  who presents to the ED for concern of right foot pain for months. Sent from [facility] for cellulitis.    Past Medical History / Co-morbidities / Social History: depression, fibroids, anxiety  Additional history: Chart reviewed. Pertinent results include: Reviewed urgent care note and images.  X-ray performed at that facility showed moderate fifth and mild to moderate fourth toe soft tissue swelling, with new 1.5 mm lucency at proximal phalanx of fifth toe concerning for osteomyelitis.  Physical Exam: Physical exam performed. The pertinent findings include: Normal vital signs, no acute distress.  Resting comfortably in exam bed.  Significant erythema of the right foot, with tissue breakdown  between the fourth and fifth digits.  Wet in appearance.  Significant tenderness.  Lab Tests/Imaging studies: I personally interpreted labs/imaging and the pertinent results include: No leukocytosis, normal hemoglobin.  CMP with moderately elevated creatinine, otherwise unremarkable.  Urinalysis with moderate hemoglobin, moderate leukocytes, few bacteria.  Chest X-ray ordered per sepsis protocol, no acute findings.  I agree with the radiologist interpretation.  Medications: I ordered medication including IV antibiotics.  I have reviewed the patients home medicines and have made adjustments as needed.  Consultations obtained: I consulted with hospitalist Dr Margo Aye who will admit. She asked I discuss with orthopedics.   I consulted with orthopedic surgeon Dr Steward Drone who did not feel patient required urgent debridement.   Disposition: After consideration of the diagnostic results and the patients response to treatment, I feel that patient would benefit from medical admission for cellulitis with XR findings of osteomyelitis.   I discussed this case with my attending physician Dr. Karene Fry who cosigned this note including patient's presenting symptoms, physical exam, and planned diagnostics and interventions. Attending physician stated agreement with plan or made changes to plan which were implemented.   Final Clinical Impression(s) / ED Diagnoses Final diagnoses:  Osteomyelitis of right foot, unspecified type (HCC)    Rx / DC Orders ED Discharge Orders     None      Portions of this report may have been transcribed using voice recognition software. Every effort was made to ensure accuracy; however, inadvertent computerized transcription errors may be present.    Jeanella Flattery 12/22/22 2327    Ernie Avena, MD 12/22/22 [id number]

## 2022-12-22 NOTE — ED Triage Notes (Signed)
Pt arrived from Urgent Care via POV c/o right foot pain. Pt states that Urgent care sent her to ED to continue work up on cellulitis of right foot. 8/10 on pain scale

## 2022-12-22 NOTE — Progress Notes (Signed)
Pharmacy Antibiotic Note  Renee Herrera is a 47 y.o. female admitted on 12/22/2022 with osteomyelitis.  Pharmacy has been consulted for Vancomycin and Cefepime dosing.  CrCl 60-70 mL/min  Plan: Vancomycin 1500mg  Q24H (eAUC 524, Scr 1.08, Vd 0.72) Cefepime 2g Q8H  Height: 5\' 6"  (167.6 cm) Weight: 73 kg (160 lb 15 oz) IBW/kg (Calculated) : 59.3  Temp (24hrs), Avg:99 F (37.2 C), Min:98.1 F (36.7 C), Max:99.5 F (37.5 C)  Recent Labs  Lab 12/22/22 2020  WBC 9.1  CREATININE 1.08*  LATICACIDVEN 0.7    Estimated Creatinine Clearance: 65.9 mL/min (A) (by C-G formula based on SCr of 1.08 mg/dL (H)).    No Known Allergies  Thank you for allowing pharmacy to be a part of this patient's care.  Ellis Savage, PharmD Clinical Pharmacist 12/22/2022 10:09 PM

## 2022-12-23 ENCOUNTER — Inpatient Hospital Stay (HOSPITAL_COMMUNITY): Payer: No Typology Code available for payment source

## 2022-12-23 DIAGNOSIS — L03115 Cellulitis of right lower limb: Secondary | ICD-10-CM

## 2022-12-23 DIAGNOSIS — M8618 Other acute osteomyelitis, other site: Secondary | ICD-10-CM

## 2022-12-23 LAB — BASIC METABOLIC PANEL
Anion gap: 6 (ref 5–15)
BUN: 9 mg/dL (ref 6–20)
CO2: 27 mmol/L (ref 22–32)
Calcium: 8.6 mg/dL — ABNORMAL LOW (ref 8.9–10.3)
Chloride: 105 mmol/L (ref 98–111)
Creatinine, Ser: 0.91 mg/dL (ref 0.44–1.00)
GFR, Estimated: >60 mL/min (ref >60)
Glucose, Bld: 135 mg/dL — ABNORMAL HIGH (ref 70–99)
Potassium: 4.1 mmol/L (ref 3.5–5.1)
Sodium: 138 mmol/L (ref 135–145)

## 2022-12-23 LAB — CBC
HCT: 35.5 % — ABNORMAL LOW (ref 36.0–46.0)
Hemoglobin: 12.8 g/dL (ref 12.0–15.0)
MCH: 29.1 pg (ref 26.0–34.0)
MCHC: 36.1 g/dL — ABNORMAL HIGH (ref 30.0–36.0)
MCV: 80.7 fL (ref 80.0–100.0)
Platelets: 283 10*3/uL (ref 150–400)
RBC: 4.4 MIL/uL (ref 3.87–5.11)
RDW: 13.5 % (ref 11.5–15.5)
WBC: 8 10*3/uL (ref 4.0–10.5)
nRBC: 0 % (ref 0.0–0.2)

## 2022-12-23 LAB — CULTURE, BLOOD (ROUTINE X 2)

## 2022-12-23 LAB — MAGNESIUM: Magnesium: 1.9 mg/dL (ref 1.7–2.4)

## 2022-12-23 LAB — PHOSPHORUS: Phosphorus: 3.5 mg/dL (ref 2.5–4.6)

## 2022-12-23 LAB — HIV ANTIBODY (ROUTINE TESTING W REFLEX): HIV Screen 4th Generation wRfx: NONREACTIVE

## 2022-12-23 MED ORDER — LACTATED RINGERS IV SOLN: INTRAVENOUS | Status: AC

## 2022-12-23 MED ORDER — SODIUM CHLORIDE 0.9 % IV SOLN
2.0000 g | INTRAVENOUS | Status: DC
  Administered 2022-12-23 – 2022-12-26 (×4): 2 g via INTRAVENOUS
  Filled 2022-12-23 (×4): qty 20

## 2022-12-23 NOTE — Progress Notes (Signed)
Ok to change cefepime to ceftriaxone 2g IV q24 empirically since she doesn't have a hx of resistance per Dr. Benjamine Mola.  Ulyses Southward, PharmD, BCIDP, AAHIVP, CPP Infectious Disease Pharmacist 12/23/2022 10:59 AM

## 2022-12-23 NOTE — Consult Note (Signed)
[facility]    Date of Admission:  12/22/2022     Total days of antibiotics 2               Reason for Consult: Osteomyelitis    Referring Provider: Dr. Benjamine Mola Primary Care Provider: Pcp, No   ASSESSMENT:  Renee Herrera is a 47 year old with chronic foot problems admitted with acute on chronic onset swelling and pain found to have early acute osteomyelitis.  No surgical intervention planned.  Blood cultures are without growth.  Continue current broad-spectrum coverage with vancomycin and ceftriaxone.  Therapeutic drug monitoring of renal function and vancomycin levels.  Continue to monitor blood cultures for presence of bacteremia.  Encouraged tobacco cessation which can lead to delayed healing and potential further infection. Discussed route of medication and Renee Herrera is "not a good pill taker." Will check with options for Home Health. Remaining medical and supportive care per Internal Medicine.  PLAN:  Continue current dose of vancomycin and ceftriaxone. Monitor cultures for presence of bacteremia. Elevate foot as able.  Encourage tobacco cessation.  Remaining medical and supportive care per Internal Medicine.    Principal Problem:   Osteomyelitis (HCC)    enoxaparin (LOVENOX) injection  40 mg Subcutaneous Q24H     HPI: Renee Herrera is a 47 y.o. female with previous medical history of anxiety and depression presenting to the hospital with right foot pain.   Renee Herrera was initially seen in the ED on 10/31/2022 with pain between her right fourth and fifth toes in the absence of trauma/injury.  Febrile with initial temperature of 100.6 F.  X-rays with no acute bony abnormality soft tissue swelling on the fifth digit.  Discharged with antifungal powder.  Return to urgent care on 12/22/22 with hypersensitivity, erythema, and swelling on the dorsum of her right foot that worsened the morning of presentation.  X-rays with moderate fifth and mild to moderate fourth  toe soft tissue swelling and new 1.5 mm lucency at the distal medial aspect of the proximal phalanx may represent a new cortical erosion and possible early osteomyelitis.  MRI right foot with findings suspicious for acute osteomyelitis of proximal middle phalanges and possible distal phalanx of the fifth toe.  Seen by orthopedic surgery with no indications for surgical intervention and recommendation of medical treatment with antibiotics.  Afebrile on admission with no leukocytosis.  Blood cultures with no growth to date.  Initially started on broad-spectrum vancomycin and cefepime and narrowed to vancomycin and ceftriaxone. Currently living with her daughter. Not a good pill taker.    Review of Systems: Review of Systems  Constitutional:  Negative for chills, fever and weight loss.  Respiratory:  Negative for cough, shortness of breath and wheezing.   Cardiovascular:  Negative for chest pain and leg swelling.  Gastrointestinal:  Negative for abdominal pain, constipation, diarrhea, nausea and vomiting.  Musculoskeletal:        Right 5th toe pain.   Skin:  Negative for rash.     Past Medical History:  Diagnosis Date   Anxiety    Depression    Fibroid    Hernia, umbilical     Social History   Tobacco Use   Smoking status: Every Day    Packs/day: .5    Types: Cigarettes   Smokeless tobacco: Never  Vaping Use   Vaping Use: Never used  Substance Use Topics   Alcohol use: Yes    Comment: on weekends    Drug  use: Yes    Types: Marijuana    Comment: "X pill" on Saturday 09/25/19    Family History  Problem Relation Age of Onset   Mental retardation Father    Cancer Other    Hypertension Other     No Known Allergies  OBJECTIVE: Blood pressure (!) 129/92, pulse 67, temperature 98.1 F (36.7 C), resp. rate 18, height 5\' 6"  (1.676 m), weight 73 kg, last menstrual period 12/20/2022, SpO2 100 %.  Physical Exam Constitutional:      General: Renee Herrera is not in acute distress.     Appearance: Renee Herrera is well-developed.  Cardiovascular:     Rate and Rhythm: Normal rate and regular rhythm.     Heart sounds: Normal heart sounds.  Pulmonary:     Effort: Pulmonary effort is normal.     Breath sounds: Normal breath sounds.  Musculoskeletal:     Comments: Erythema and edema of the right toe with tenderness of the right fifth toe around the MCP joint and distally.   Skin:    General: Skin is warm and dry.  Neurological:     Mental Status: Renee Herrera is alert and oriented to person, place, and time.  Psychiatric:        Mood and Affect: Mood normal.     Lab Results Lab Results  Component Value Date   WBC 8.0 12/23/2022   HGB 12.8 12/23/2022   HCT 35.5 (L) 12/23/2022   MCV 80.7 12/23/2022   PLT 283 12/23/2022    Lab Results  Component Value Date   CREATININE 0.91 12/23/2022   BUN 9 12/23/2022   NA 138 12/23/2022   K 4.1 12/23/2022   CL 105 12/23/2022   CO2 27 12/23/2022    Lab Results  Component Value Date   ALT 10 12/22/2022   AST 14 (L) 12/22/2022   ALKPHOS 65 12/22/2022   BILITOT 0.5 12/22/2022     Microbiology: Recent Results (from the past 240 hour(s))  Blood Cultures x 2 sites     Status: None (Preliminary result)   Collection Time: 12/22/22  9:24 PM   Specimen: BLOOD  Result Value Ref Range Status   Specimen Description BLOOD LEFT ANTECUBITAL  Final   Special Requests   Final    BOTTLES DRAWN AEROBIC AND ANAEROBIC Blood Culture results may not be optimal due to an inadequate volume of blood received in culture bottles   Culture   Final    NO GROWTH < 24 HOURS Performed at [facility], [address] [place], [place] [address]    Report Status PENDING  Incomplete  Blood Cultures x 2 sites     Status: None (Preliminary result)   Collection Time: 12/22/22  9:29 PM   Specimen: BLOOD  Result Value Ref Range Status   Specimen Description BLOOD BLOOD RIGHT FOREARM  Final   Special Requests   Final    BOTTLES DRAWN AEROBIC AND ANAEROBIC Blood  Culture adequate volume   Culture   Final    NO GROWTH < 24 HOURS Performed at [facility], [address] [place], [place] [address]    Report Status PENDING  Incomplete     Marcos Eke, NP [facility]  12/23/2022  4:08 PM

## 2022-12-23 NOTE — Consult Note (Signed)
Reason for Consult:Right 5th toe osteo Referring Physician: Marlin Canary Time called: 1156 Time at bedside: 1245   Renee Herrera is an 47 y.o. female.  HPI: Renee Herrera was admitted yesterday with right foot cellulitis and 5th toe osteo. Orthopedic surgery was consulted and recommended non-operative management with IV abx directed by ID given her young age and no complicating diagnoses. She says she's been dealing with problems with this toe for 2+ years but obviously much worse lately.  Past Medical History:  Diagnosis Date   Anxiety    Depression    Fibroid    Hernia, umbilical     Past Surgical History:  Procedure Laterality Date   CESAREAN SECTION     TUBAL LIGATION      Family History  Problem Relation Age of Onset   Mental retardation Father    Cancer Other    Hypertension Other     Social History:  reports that she has been smoking cigarettes. She has been smoking an average of .5 packs per day. She has never used smokeless tobacco. She reports current alcohol use. She reports current drug use. Drug: Marijuana.  Allergies: No Known Allergies  Medications: I have reviewed the patient's current medications.  Results for orders placed or performed during the hospital encounter of 12/22/22 (from the past 48 hour(s))  Lactic acid, plasma     Status: None   Collection Time: 12/22/22  8:20 PM  Result Value Ref Range   Lactic Acid, Venous 0.7 0.5 - 1.9 mmol/L    Comment: Performed at [facility], [address] [place], [place] [address]  Comprehensive metabolic panel     Status: Abnormal   Collection Time: 12/22/22  8:20 PM  Result Value Ref Range   Sodium 137 135 - 145 mmol/L   Potassium 3.5 3.5 - 5.1 mmol/L   Chloride 101 98 - 111 mmol/L   CO2 25 22 - 32 mmol/L   Glucose, Bld 102 (H) 70 - 99 mg/dL    Comment: Glucose reference range applies only to samples taken after fasting for at least 8 hours.   BUN 12 6 - 20 mg/dL   Creatinine, Ser 7.84 (H) 0.44 - 1.00  mg/dL   Calcium 9.5 8.9 - 69.6 mg/dL   Total Protein 6.8 6.5 - 8.1 g/dL   Albumin 3.7 3.5 - 5.0 g/dL   AST 14 (L) 15 - 41 U/L   ALT 10 0 - 44 U/L   Alkaline Phosphatase 65 38 - 126 U/L   Total Bilirubin 0.5 0.3 - 1.2 mg/dL   GFR, Estimated >29 >52 mL/min    Comment: (NOTE) Calculated using the CKD-EPI Creatinine Equation (2021)    Anion gap 11 5 - 15    Comment: Performed at [facility], [address], [place], [place] [address]  CBC with Differential     Status: Abnormal   Collection Time: 12/22/22  8:20 PM  Result Value Ref Range   WBC 9.1 4.0 - 10.5 K/uL   RBC 4.69 3.87 - 5.11 MIL/uL   Hemoglobin 13.6 12.0 - 15.0 g/dL   HCT 44.0 10.2 - 72.5 %   MCV 80.0 80.0 - 100.0 fL   MCH 29.0 26.0 - 34.0 pg   MCHC 36.3 (H) 30.0 - 36.0 g/dL   RDW 36.6 44.0 - 34.7 %   Platelets 310 150 - 400 K/uL   nRBC 0.0 0.0 - 0.2 %   Neutrophils Relative % 67 %   Neutro  Abs 6.1 1.7 - 7.7 K/uL   Lymphocytes Relative 23 %   Lymphs Abs 2.1 0.7 - 4.0 K/uL   Monocytes Relative 8 %   Monocytes Absolute 0.7 0.1 - 1.0 K/uL   Eosinophils Relative 1 %   Eosinophils Absolute 0.1 0.0 - 0.5 K/uL   Basophils Relative 1 %   Basophils Absolute 0.1 0.0 - 0.1 K/uL   Immature Granulocytes 0 %   Abs Immature Granulocytes 0.02 0.00 - 0.07 K/uL    Comment: Performed at [facility], [address], [place], [place] [address]  Urinalysis, Routine w reflex microscopic -Urine, Clean Catch     Status: Abnormal   Collection Time: 12/22/22  8:20 PM  Result Value Ref Range   Color, Urine YELLOW YELLOW   APPearance HAZY (A) CLEAR   Specific Gravity, Urine 1.023 1.005 - 1.030   pH 5.0 5.0 - 8.0   Glucose, UA NEGATIVE NEGATIVE mg/dL   Hgb urine dipstick MODERATE (A) NEGATIVE   Bilirubin Urine NEGATIVE NEGATIVE   Ketones, ur NEGATIVE NEGATIVE mg/dL   Protein, ur NEGATIVE NEGATIVE mg/dL   Nitrite NEGATIVE NEGATIVE   Leukocytes,Ua MODERATE (A) NEGATIVE   RBC / HPF 11-20 0 - 5 RBC/hpf   WBC, UA 21-50 0 - 5  WBC/hpf   Bacteria, UA FEW (A) NONE SEEN   Squamous Epithelial / HPF 6-10 0 - 5 /HPF   Mucus PRESENT     Comment: Performed at [facility], [address], [place], [place] [address]  hCG, quantitative, pregnancy     Status: None   Collection Time: 12/22/22  8:20 PM  Result Value Ref Range   hCG, Beta Chain, Quant, S 2 <5 mIU/mL    Comment:          GEST. AGE      CONC.  (mIU/mL)   <=1 WEEK        5 - 50     2 WEEKS       50 - 500     3 WEEKS       100 - 10,000     4 WEEKS     1,000 - 30,000     5 WEEKS     3,500 - 115,000   6-8 WEEKS     12,000 - 270,000    12 WEEKS     15,000 - 220,000        FEMALE AND NON-PREGNANT FEMALE:     LESS THAN 5 mIU/mL Performed at [facility], [address] [place], [place] [address]   Sedimentation rate     Status: None   Collection Time: 12/22/22 10:04 PM  Result Value Ref Range   Sed Rate 12 0 - 22 mm/hr    Comment: Performed at [facility], [address], [place], [place] [address]  C-reactive protein     Status: Abnormal   Collection Time: 12/22/22 10:04 PM  Result Value Ref Range   CRP 1.9 (H) <1.0 mg/dL    Comment: Performed at [facility], [address] [place], [place] [address]  HIV Antibody (routine testing w rflx)     Status: None   Collection Time: 12/22/22 10:04 PM  Result Value Ref Range   HIV Screen 4th Generation wRfx Non Reactive Non Reactive    Comment: Performed at [facility], [address], [place], [place] [address]  CBC     Status: Abnormal   Collection  Time: 12/23/22  8:42 AM  Result Value Ref Range   WBC 8.0 4.0 - 10.5 K/uL   RBC 4.40 3.87 - 5.11 MIL/uL   Hemoglobin 12.8 12.0 - 15.0 g/dL   HCT 11.9 (L) 14.7 - 82.9 %   MCV 80.7 80.0 - 100.0 fL   MCH 29.1 26.0 - 34.0 pg   MCHC 36.1 (H) 30.0 - 36.0 g/dL   RDW 56.2 13.0 - 86.5 %   Platelets 283 150 - 400 K/uL   nRBC 0.0 0.0 - 0.2 %    Comment: Performed at [facility], [address], [place], [place] [address]  Basic  metabolic panel     Status: Abnormal   Collection Time: 12/23/22  8:42 AM  Result Value Ref Range   Sodium 138 135 - 145 mmol/L   Potassium 4.1 3.5 - 5.1 mmol/L   Chloride 105 98 - 111 mmol/L   CO2 27 22 - 32 mmol/L   Glucose, Bld 135 (H) 70 - 99 mg/dL    Comment: Glucose reference range applies only to samples taken after fasting for at least 8 hours.   BUN 9 6 - 20 mg/dL   Creatinine, Ser 6.29 0.44 - 1.00 mg/dL   Calcium 8.6 (L) 8.9 - 10.3 mg/dL   GFR, Estimated >52 >84 mL/min    Comment: (NOTE) Calculated using the CKD-EPI Creatinine Equation (2021)    Anion gap 6 5 - 15    Comment: Performed at [facility], [address] [place], [place] [address]  Magnesium     Status: None   Collection Time: 12/23/22  8:42 AM  Result Value Ref Range   Magnesium 1.9 1.7 - 2.4 mg/dL    Comment: Performed at [facility], [address], [place], [place] [address]  Phosphorus     Status: None   Collection Time: 12/23/22  8:42 AM  Result Value Ref Range   Phosphorus 3.5 2.5 - 4.6 mg/dL    Comment: Performed at [facility], [address], [place], [place] [address]    MR FOOT RIGHT WO CONTRAST  Result Date: 12/23/2022 CLINICAL DATA:  Several weeks of right foot pain. Wound between fourth and fifth toes. Initially presented to the ED 10/2022 horizontal foot fungal infection. For the past 24 hours dorsal right foot is swollen, erythematous, tender, and warm to the touch. Urgent care x-ray revealed findings concerning for possible osteomyelitis of the fifth toe. EXAM: MRI OF THE RIGHT FOREFOOT WITHOUT CONTRAST TECHNIQUE: Multiplanar, multisequence MR imaging of the right forefoot was performed. No intravenous contrast was administered. COMPARISON:  Right foot radiographs 12/22/2022, 10/31/2022 FINDINGS: Bones/Joint/Cartilage On yesterday's right foot radiographs there was a minimal 1.5 mm possible lucency at the distal medial aspect of the proximal phalanx of fifth toe that may be  new from 10/31/2022. On the current MRI, there is moderate fifth toe soft tissue swelling and edema. There is high-grade increased T2 signal throughout the shaft through the distal aspect of the proximal phalanx and throughout the middle phalanx and likely mildly within the distal phalanx of the fifth toe on axial T2 weighted images (axial series 5 images 26 through 33). This marrow edema appears to be confirmed on sagittal STIR series 4 images 4 and 5 and coronal STIR series 6 images 12 and 13). There may be mild cortical erosion within portions of the lateral aspect of the middle phalanx (axial series 2, image 31) and lateral aspect of the distal  phalanx (axial series 2, image 33 and 34), suspicious for acute osteomyelitis. There appears to be edema at the interface of the bone cortex and the surrounding musculature of the majority of the fourth metatarsal shaft (axial series 5 images 12 through 22 and coronal series 6, image 12), however no cortical defect or marrow edema is seen. Trace edema is also seen medial to the medial cortex of the mid fifth metatarsal shaft (axial series 5, image 13). Moderate hallux valgus.  No acute fracture is seen. Ligaments The Lisfranc ligament complex is intact. The medial and lateral metatarsophalangeal and interphalangeal ligaments appear intact. Muscles and Tendons Mild edema within the lumbar fall muscles around the fourth metatarsal shaft. No tendon tear is identified. Soft tissues There is moderate dorsal lateral greater than medial infection forefoot soft tissue swelling extending into the subcutaneous fat of the fifth toe greater than the fourth toe, and the subcutaneous fat plantar to the third through fifth toe proximal aspect of the proximal phalanges. IMPRESSION: 1. Moderate dorsal lateral greater than medial forefoot soft tissue swelling and edema extending into the subcutaneous fat of the fifth toe greater than the fourth toe, and the subcutaneous fat plantar to the  third through fifth toe proximal phalanges. 2. Findings suspicious for acute osteomyelitis of a proximal middle phalanges and possible distal phalanx of the fifth toe. 3. There appears to be edema at the interface of the bone cortex and the surrounding musculature of the majority of the fourth metatarsal shaft, however no cortical defect or marrow edema is seen to indicate acute osteomyelitis. 4. Moderate hallux valgus. Electronically Signed   By: Neita Garnet M.D.   On: 12/23/2022 11:40   DG Chest 2 View  Result Date: 12/22/2022 CLINICAL DATA:  Edema/infection EXAM: CHEST - 2 VIEW COMPARISON:  07/22/2018 FINDINGS: Lungs are clear.  No pleural effusion or pneumothorax. The heart is normal in size. Visualized osseous structures are within normal limits. IMPRESSION: Normal chest radiographs. Electronically Signed   By: Charline Bills M.D.   On: 12/22/2022 20:48   DG Foot Complete Right  Result Date: 12/22/2022 CLINICAL DATA:  Pain between the fourth and fifth toes for 3 days. Diffuse dorsal swelling. Evaluate for osteomyelitis. I for metatarsophalangeal fracture of the fifth toe. No known injury. EXAM: RIGHT FOOT COMPLETE - 3+ VIEW COMPARISON:  Right foot radiographs 10/31/2022 FINDINGS: Moderate fifth and mild-to-moderate fourth toe soft tissue swelling appears similar to 10/31/2022. On oblique view there is a 1.5 mm lucency at the distal medial aspect of the proximal phalanx of the fifth toe that appears new from 10/31/2022, although slight differences in projection may be why this is not seen on the prior study. A new cortical erosion in the setting of fifth toe soft tissue swelling is possible. No subcutaneous air. Moderate hallux valgus is unchanged. No acute fracture or dislocation. IMPRESSION: 1. Moderate fifth and mild-to-moderate fourth toe soft tissue swelling appears similar to 10/31/2022. 2. New 1.5 mm lucency at the distal medial aspect of the proximal phalanx of the fifth toe may represent a  new cortical erosion. Given the fifth toe soft tissue swelling, recommend clinical correlation for possible posttraumatic change versus possible very early osteomyelitis. 3. Moderate hallux valgus. Electronically Signed   By: Neita Garnet M.D.   On: 12/22/2022 18:57    Review of Systems  Constitutional:  Negative for chills, diaphoresis and fever.  HENT:  Negative for ear discharge, ear pain, hearing loss and tinnitus.   Eyes:  Negative for  photophobia and pain.  Respiratory:  Negative for cough and shortness of breath.   Cardiovascular:  Negative for chest pain.  Gastrointestinal:  Negative for abdominal pain, nausea and vomiting.  Genitourinary:  Negative for dysuria, flank pain, frequency and urgency.  Musculoskeletal:  Positive for arthralgias (Right 4th and 5th toes). Negative for back pain, myalgias and neck pain.  Neurological:  Negative for dizziness and headaches.  Hematological:  Does not bruise/bleed easily.  Psychiatric/Behavioral:  The patient is not nervous/anxious.    Blood pressure (!) 139/96, pulse 74, temperature 98 F (36.7 C), resp. rate 18, height 5\' 6"  (1.676 m), weight 73 kg, last menstrual period 12/20/2022, SpO2 100 %. Physical Exam Constitutional:      General: She is not in acute distress.    Appearance: She is well-developed. She is not diaphoretic.  HENT:     Head: Normocephalic and atraumatic.  Eyes:     General: No scleral icterus.       Right eye: No discharge.        Left eye: No discharge.     Conjunctiva/sclera: Conjunctivae normal.  Cardiovascular:     Rate and Rhythm: Normal rate and regular rhythm.  Pulmonary:     Effort: Pulmonary effort is normal. No respiratory distress.  Musculoskeletal:     Cervical back: Normal range of motion.  Feet:     Comments: LLE Sens DPN, SPN, TN intact  Motor EHL, ext, flex, evers 5/5             Erythema dorsal forefoot, fusiform edema 5th toe  DP 2+, PT 2+, No significant edema Skin:    General: Skin is  warm and dry.  Neurological:     Mental Status: She is alert.  Psychiatric:        Mood and Affect: Mood normal.        Behavior: Behavior normal.     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 F/u with Dr. Lajoyce Corners at conclusion of therapy if needed.    Freeman Caldron, PA-C Orthopedic Surgery [phone] 12/23/2022, 12:52 PM

## 2022-12-23 NOTE — Progress Notes (Signed)
PROGRESS NOTE    Renee Herrera  ZOX:[id number] DOB: [birth year] DOA: 12/22/2022 PCP: Pcp, No    Brief Narrative:  Renee Herrera is a 47 y.o. female with no significant medical history who presented with several weeks of right foot pain.  Mainly involving a wound between her fourth and fifth right toes.  She initially presented to the ED in May, was thought to have a foot fungal infection.  Her symptoms did not improve with outpatient management.  She continued to have pain in her right foot, worsened with walking.  For the past 24 hours she noted that the dorsum part of her right foot was swollen, erythematous, tender and warm to the touch.  She presented to urgent care today where an x-ray of her right foot was done.  It revealed findings concerning for possible osteomyelitis of the fifth toe.  The patient was sent to the ED for further evaluation and management of R foot cellulitis and possible 5th toe osteomyelitis.   In the ED, noted to have a low-grade fever 99.5.  No leukocytosis.  Peripheral blood cultures x 2 were obtained.  The patient was started on broad-spectrum IV antibiotics IV vancomycin and cefepime.  Additionally, the patient received 1 L IV fluid bolus LR x1.     Right foot x-ray 12/22/2022 revealed the following findings:   1. Moderate fifth and mild-to-moderate fourth toe soft tissue swelling appears similar to 10/31/2022. 2. New 1.5 mm lucency at the distal medial aspect of the proximal phalanx of the fifth toe may represent a new cortical erosion. Given the fifth toe soft tissue swelling, recommend clinical correlation for possible posttraumatic change versus possible very early osteomyelitis. 3. Moderate hallux valgus.   Assessment and Plan: Right foot dorsum cellulitis and suspected right fifth toe osteomyelitis, poa -unclear if ortho was actually consulted-- will get MRI of foot - IV antibiotics IV vancomycin and cefepime -Pain control and bowel regimen    Tobacco use disorder Tobacco cessation recommended   Polysubstance abuse Polysubstance cessation recommended     DVT prophylaxis: enoxaparin (LOVENOX) injection 40 mg Start: 12/23/22 1800 SCDs Start: 12/22/22 2325    Code Status: Full Code  Disposition Plan:  Level of care: Med-Surg Status is: Inpatient Remains inpatient appropriate     Consultants:  Ortho?-- per ER [place]     Subjective: C/o pain in foot  Objective: Vitals:   12/22/22 2315 12/22/22 2341 12/23/22 0023 12/23/22 0745  BP: 135/81  (!) 150/95 (!) 139/96  Pulse: (!) 52  70 74  Resp: 18  18 18   Temp:  97.6 F (36.4 C) 97.7 F (36.5 C) 98 F (36.7 C)  TempSrc:  Oral    SpO2: 97%  100% 100%  Weight:      Height:        Intake/Output Summary (Last 24 hours) at 12/23/2022 1008 Last data filed at 12/23/2022 0100 Gross per 24 hour  Intake 240 ml  Output --  Net 240 ml   Filed Weights   12/22/22 2200  Weight: 73 kg    Examination:   General: Appearance:     Overweight female in no acute distress     Lungs:     Clear to auscultation bilaterally, respirations unlabored  Heart:    Normal heart rate. Normal rhythm. No murmurs, rubs, or gallops.    MS:   All extremities are intact.  Right foot swollen and erythematous    Neurologic:   Awake, alert, oriented x 3. No  apparent focal neurological           defect.        Data Reviewed: I have personally reviewed following labs and imaging studies  CBC: Recent Labs  Lab 12/22/22 2020 12/23/22 0842  WBC 9.1 8.0  NEUTROABS 6.1  --   HGB 13.6 12.8  HCT 37.5 35.5*  MCV 80.0 80.7  PLT 310 283   Basic Metabolic Panel: Recent Labs  Lab 12/22/22 2020 12/23/22 0842  NA 137 138  K 3.5 4.1  CL 101 105  CO2 25 27  GLUCOSE 102* 135*  BUN 12 9  CREATININE 1.08* 0.91  CALCIUM 9.5 8.6*  MG  --  1.9  PHOS  --  3.5   GFR: Estimated Creatinine Clearance: 78.2 mL/min (by C-G formula based on SCr of 0.91 mg/dL). Liver Function  Tests: Recent Labs  Lab 12/22/22 2020  AST 14*  ALT 10  ALKPHOS 65  BILITOT 0.5  PROT 6.8  ALBUMIN 3.7   No results for input(s): "LIPASE", "AMYLASE" in the last 168 hours. No results for input(s): "AMMONIA" in the last 168 hours. Coagulation Profile: No results for input(s): "INR", "PROTIME" in the last 168 hours. Cardiac Enzymes: No results for input(s): "CKTOTAL", "CKMB", "CKMBINDEX", "TROPONINI" in the last 168 hours. BNP (last 3 results) No results for input(s): "PROBNP" in the last 8760 hours. HbA1C: No results for input(s): "HGBA1C" in the last 72 hours. CBG: No results for input(s): "GLUCAP" in the last 168 hours. Lipid Profile: No results for input(s): "CHOL", "HDL", "LDLCALC", "TRIG", "CHOLHDL", "LDLDIRECT" in the last 72 hours. Thyroid Function Tests: No results for input(s): "TSH", "T4TOTAL", "FREET4", "T3FREE", "THYROIDAB" in the last 72 hours. Anemia Panel: No results for input(s): "VITAMINB12", "FOLATE", "FERRITIN", "TIBC", "IRON", "RETICCTPCT" in the last 72 hours. Sepsis Labs: Recent Labs  Lab 12/22/22 2020  LATICACIDVEN 0.7    No results found for this or any previous visit (from the past 240 hour(s)).       Radiology Studies: DG Chest 2 View  Result Date: 12/22/2022 CLINICAL DATA:  Edema/infection EXAM: CHEST - 2 VIEW COMPARISON:  07/22/2018 FINDINGS: Lungs are clear.  No pleural effusion or pneumothorax. The heart is normal in size. Visualized osseous structures are within normal limits. IMPRESSION: Normal chest radiographs. Electronically Signed   By: Charline Bills M.D.   On: 12/22/2022 20:48   DG Foot Complete Right  Result Date: 12/22/2022 CLINICAL DATA:  Pain between the fourth and fifth toes for 3 days. Diffuse dorsal swelling. Evaluate for osteomyelitis. I for metatarsophalangeal fracture of the fifth toe. No known injury. EXAM: RIGHT FOOT COMPLETE - 3+ VIEW COMPARISON:  Right foot radiographs 10/31/2022 FINDINGS: Moderate fifth and  mild-to-moderate fourth toe soft tissue swelling appears similar to 10/31/2022. On oblique view there is a 1.5 mm lucency at the distal medial aspect of the proximal phalanx of the fifth toe that appears new from 10/31/2022, although slight differences in projection may be why this is not seen on the prior study. A new cortical erosion in the setting of fifth toe soft tissue swelling is possible. No subcutaneous air. Moderate hallux valgus is unchanged. No acute fracture or dislocation. IMPRESSION: 1. Moderate fifth and mild-to-moderate fourth toe soft tissue swelling appears similar to 10/31/2022. 2. New 1.5 mm lucency at the distal medial aspect of the proximal phalanx of the fifth toe may represent a new cortical erosion. Given the fifth toe soft tissue swelling, recommend clinical correlation for possible posttraumatic change versus possible very  early osteomyelitis. 3. Moderate hallux valgus. Electronically Signed   By: Neita Garnet M.D.   On: 12/22/2022 18:57        Scheduled Meds:  enoxaparin (LOVENOX) injection  40 mg Subcutaneous Q24H   Continuous Infusions:  ceFEPime (MAXIPIME) IV 2 g (12/23/22 0602)   lactated ringers 75 mL/hr at 12/23/22 0054   vancomycin       LOS: 1 day    Time spent: 45 minutes spent on chart review, discussion with nursing staff, consultants, updating family and interview/physical exam; more than 50% of that time was spent in counseling and/or coordination of care.    Joseph Art, DO [facility] Available via Epic secure chat 7am-7pm After these hours, please refer to coverage provider listed on [email or web address] 12/23/2022, 10:08 AM

## 2022-12-24 ENCOUNTER — Other Ambulatory Visit (HOSPITAL_COMMUNITY): Payer: Self-pay

## 2022-12-24 DIAGNOSIS — Z79899 Other long term (current) drug therapy: Secondary | ICD-10-CM

## 2022-12-24 DIAGNOSIS — F17210 Nicotine dependence, cigarettes, uncomplicated: Secondary | ICD-10-CM

## 2022-12-24 DIAGNOSIS — M86171 Other acute osteomyelitis, right ankle and foot: Principal | ICD-10-CM

## 2022-12-24 LAB — CULTURE, BLOOD (ROUTINE X 2): Culture: NO GROWTH

## 2022-12-24 MED ORDER — SENNA 8.6 MG PO TABS
1.0000 | ORAL_TABLET | Freq: Every day | ORAL | Status: DC | PRN
  Administered 2022-12-24: 8.6 mg via ORAL
  Filled 2022-12-24: qty 1

## 2022-12-24 MED ORDER — DOXYCYCLINE HYCLATE 100 MG PO TABS
100.0000 mg | ORAL_TABLET | Freq: Two times a day (BID) | ORAL | 0 refills | Status: DC
  Filled 2022-12-24: qty 56, 28d supply, fill #0

## 2022-12-24 MED ORDER — DIPHENHYDRAMINE HCL 25 MG PO CAPS
25.0000 mg | ORAL_CAPSULE | Freq: Four times a day (QID) | ORAL | Status: DC | PRN
  Administered 2022-12-24: 25 mg via ORAL
  Filled 2022-12-24: qty 1

## 2022-12-24 MED ORDER — CEFADROXIL 500 MG PO CAPS
1000.0000 mg | ORAL_CAPSULE | Freq: Two times a day (BID) | ORAL | 0 refills | Status: DC
  Filled 2022-12-24: qty 112, 28d supply, fill #0

## 2022-12-24 NOTE — Progress Notes (Signed)
PROGRESS NOTE    JENNILEE CULLOP  WUJ:[id number] DOB: 1976-04-13 DOA: 12/22/2022 PCP: Pcp, No    Brief Narrative:  Renee Herrera is a 47 y.o. female with no significant medical history who presented with several weeks of right foot pain.  Mainly involving a wound between her fourth and fifth right toes.  She initially presented to the ED in May, was thought to have a foot fungal infection.  Her symptoms did not improve with outpatient management.  She continued to have pain in her right foot, worsened with walking.  For the past 24 hours she noted that the dorsum part of her right foot was swollen, erythematous, tender and warm to the touch.  She presented to urgent care today where an x-ray of her right foot was done.  It revealed findings concerning for possible osteomyelitis of the fifth toe.  The patient was sent to the ED for further evaluation and management of R foot cellulitis and possible 5th toe osteomyelitis.   Seen by ID and ortho.  PLan for IV abx for now   Assessment and Plan: Right foot dorsum cellulitis and suspected right fifth toe osteomyelitis, poa -MRI with ? Osteo -ortho consult-- recommend abx and outpatient follow up - IV antibiotics -ID consulted -Pain control with bowel regimen -elevate extremity   Tobacco use disorder Tobacco cessation recommended   Polysubstance abuse Polysubstance cessation recommended     DVT prophylaxis: enoxaparin (LOVENOX) injection 40 mg Start: 12/23/22 1800 SCDs Start: 12/22/22 2325    Code Status: Full Code  Disposition Plan:  Level of care: Med-Surg Status is: Inpatient Remains inpatient appropriate     Consultants:  Ortho ID     Subjective: Still with pain  Objective: Vitals:   12/23/22 1307 12/23/22 2121 12/24/22 0404 12/24/22 0740  BP: (!) 129/92 127/84 117/85 120/80  Pulse: 67 79 81 68  Resp: 18 16 16 16   Temp: 98.1 F (36.7 C) 98.8 F (37.1 C) 99.1 F (37.3 C) 98.3 F (36.8 C)  TempSrc:  Oral Oral  Oral  SpO2: 100% 100% 98% 98%  Weight:      Height:        Intake/Output Summary (Last 24 hours) at 12/24/2022 1032 Last data filed at 12/23/2022 1800 Gross per 24 hour  Intake 474 ml  Output 2 ml  Net 472 ml   Filed Weights   12/22/22 2200  Weight: 73 kg    Examination:    General: Appearance:     Overweight female in no acute distress     Lungs:     respirations unlabored  Heart:    Normal heart rate.  MS:   All extremities are intact.   Neurologic:   Awake, alert       Data Reviewed: I have personally reviewed following labs and imaging studies  CBC: Recent Labs  Lab 12/22/22 2020 12/23/22 0842  WBC 9.1 8.0  NEUTROABS 6.1  --   HGB 13.6 12.8  HCT 37.5 35.5*  MCV 80.0 80.7  PLT 310 283   Basic Metabolic Panel: Recent Labs  Lab 12/22/22 2020 12/23/22 0842  NA 137 138  K 3.5 4.1  CL 101 105  CO2 25 27  GLUCOSE 102* 135*  BUN 12 9  CREATININE 1.08* 0.91  CALCIUM 9.5 8.6*  MG  --  1.9  PHOS  --  3.5   GFR: Estimated Creatinine Clearance: 78.2 mL/min (by C-G formula based on SCr of 0.91 mg/dL). Liver Function Tests: Recent Labs  Lab 12/22/22 2020  AST 14*  ALT 10  ALKPHOS 65  BILITOT 0.5  PROT 6.8  ALBUMIN 3.7   No results for input(s): "LIPASE", "AMYLASE" in the last 168 hours. No results for input(s): "AMMONIA" in the last 168 hours. Coagulation Profile: No results for input(s): "INR", "PROTIME" in the last 168 hours. Cardiac Enzymes: No results for input(s): "CKTOTAL", "CKMB", "CKMBINDEX", "TROPONINI" in the last 168 hours. BNP (last 3 results) No results for input(s): "PROBNP" in the last 8760 hours. HbA1C: No results for input(s): "HGBA1C" in the last 72 hours. CBG: No results for input(s): "GLUCAP" in the last 168 hours. Lipid Profile: No results for input(s): "CHOL", "HDL", "LDLCALC", "TRIG", "CHOLHDL", "LDLDIRECT" in the last 72 hours. Thyroid Function Tests: No results for input(s): "TSH", "T4TOTAL", "FREET4", "T3FREE",  "THYROIDAB" in the last 72 hours. Anemia Panel: No results for input(s): "VITAMINB12", "FOLATE", "FERRITIN", "TIBC", "IRON", "RETICCTPCT" in the last 72 hours. Sepsis Labs: Recent Labs  Lab 12/22/22 2020  LATICACIDVEN 0.7    Recent Results (from the past 240 hour(s))  Blood Cultures x 2 sites     Status: None (Preliminary result)   Collection Time: 12/22/22  9:24 PM   Specimen: BLOOD  Result Value Ref Range Status   Specimen Description BLOOD LEFT ANTECUBITAL  Final   Special Requests   Final    BOTTLES DRAWN AEROBIC AND ANAEROBIC Blood Culture results may not be optimal due to an inadequate volume of blood received in culture bottles   Culture   Final    NO GROWTH < 24 HOURS Performed at [facility], [address], [place], [place] [address]    Report Status PENDING  Incomplete  Blood Cultures x 2 sites     Status: None (Preliminary result)   Collection Time: 12/22/22  9:29 PM   Specimen: BLOOD  Result Value Ref Range Status   Specimen Description BLOOD BLOOD RIGHT FOREARM  Final   Special Requests   Final    BOTTLES DRAWN AEROBIC AND ANAEROBIC Blood Culture adequate volume   Culture   Final    NO GROWTH < 24 HOURS Performed at [facility], [address] [place], [place] [address]    Report Status PENDING  Incomplete         Radiology Studies: MR FOOT RIGHT WO CONTRAST  Result Date: 12/23/2022 CLINICAL DATA:  Several weeks of right foot pain. Wound between fourth and fifth toes. Initially presented to the ED 10/2022 horizontal foot fungal infection. For the past 24 hours dorsal right foot is swollen, erythematous, tender, and warm to the touch. Urgent care x-ray revealed findings concerning for possible osteomyelitis of the fifth toe. EXAM: MRI OF THE RIGHT FOREFOOT WITHOUT CONTRAST TECHNIQUE: Multiplanar, multisequence MR imaging of the right forefoot was performed. No intravenous contrast was administered. COMPARISON:  Right foot radiographs 12/22/2022,  10/31/2022 FINDINGS: Bones/Joint/Cartilage On yesterday's right foot radiographs there was a minimal 1.5 mm possible lucency at the distal medial aspect of the proximal phalanx of fifth toe that may be new from 10/31/2022. On the current MRI, there is moderate fifth toe soft tissue swelling and edema. There is high-grade increased T2 signal throughout the shaft through the distal aspect of the proximal phalanx and throughout the middle phalanx and likely mildly within the distal phalanx of the fifth toe on axial T2 weighted images (axial series 5 images 26 through 33). This marrow edema appears to be confirmed on sagittal STIR series 4 images 4 and 5  and coronal STIR series 6 images 12 and 13). There may be mild cortical erosion within portions of the lateral aspect of the middle phalanx (axial series 2, image 31) and lateral aspect of the distal phalanx (axial series 2, image 33 and 34), suspicious for acute osteomyelitis. There appears to be edema at the interface of the bone cortex and the surrounding musculature of the majority of the fourth metatarsal shaft (axial series 5 images 12 through 22 and coronal series 6, image 12), however no cortical defect or marrow edema is seen. Trace edema is also seen medial to the medial cortex of the mid fifth metatarsal shaft (axial series 5, image 13). Moderate hallux valgus.  No acute fracture is seen. Ligaments The Lisfranc ligament complex is intact. The medial and lateral metatarsophalangeal and interphalangeal ligaments appear intact. Muscles and Tendons Mild edema within the lumbar fall muscles around the fourth metatarsal shaft. No tendon tear is identified. Soft tissues There is moderate dorsal lateral greater than medial infection forefoot soft tissue swelling extending into the subcutaneous fat of the fifth toe greater than the fourth toe, and the subcutaneous fat plantar to the third through fifth toe proximal aspect of the proximal phalanges. IMPRESSION: 1.  Moderate dorsal lateral greater than medial forefoot soft tissue swelling and edema extending into the subcutaneous fat of the fifth toe greater than the fourth toe, and the subcutaneous fat plantar to the third through fifth toe proximal phalanges. 2. Findings suspicious for acute osteomyelitis of a proximal middle phalanges and possible distal phalanx of the fifth toe. 3. There appears to be edema at the interface of the bone cortex and the surrounding musculature of the majority of the fourth metatarsal shaft, however no cortical defect or marrow edema is seen to indicate acute osteomyelitis. 4. Moderate hallux valgus. Electronically Signed   By: Neita Garnet M.D.   On: 12/23/2022 11:40   DG Chest 2 View  Result Date: 12/22/2022 CLINICAL DATA:  Edema/infection EXAM: CHEST - 2 VIEW COMPARISON:  07/22/2018 FINDINGS: Lungs are clear.  No pleural effusion or pneumothorax. The heart is normal in size. Visualized osseous structures are within normal limits. IMPRESSION: Normal chest radiographs. Electronically Signed   By: Charline Bills M.D.   On: 12/22/2022 20:48   DG Foot Complete Right  Result Date: 12/22/2022 CLINICAL DATA:  Pain between the fourth and fifth toes for 3 days. Diffuse dorsal swelling. Evaluate for osteomyelitis. I for metatarsophalangeal fracture of the fifth toe. No known injury. EXAM: RIGHT FOOT COMPLETE - 3+ VIEW COMPARISON:  Right foot radiographs 10/31/2022 FINDINGS: Moderate fifth and mild-to-moderate fourth toe soft tissue swelling appears similar to 10/31/2022. On oblique view there is a 1.5 mm lucency at the distal medial aspect of the proximal phalanx of the fifth toe that appears new from 10/31/2022, although slight differences in projection may be why this is not seen on the prior study. A new cortical erosion in the setting of fifth toe soft tissue swelling is possible. No subcutaneous air. Moderate hallux valgus is unchanged. No acute fracture or dislocation. IMPRESSION: 1.  Moderate fifth and mild-to-moderate fourth toe soft tissue swelling appears similar to 10/31/2022. 2. New 1.5 mm lucency at the distal medial aspect of the proximal phalanx of the fifth toe may represent a new cortical erosion. Given the fifth toe soft tissue swelling, recommend clinical correlation for possible posttraumatic change versus possible very early osteomyelitis. 3. Moderate hallux valgus. Electronically Signed   By: Neita Garnet M.D.   On:  12/22/2022 18:57        Scheduled Meds:  enoxaparin (LOVENOX) injection  40 mg Subcutaneous Q24H   Continuous Infusions:  cefTRIAXone (ROCEPHIN)  IV 2 g (12/23/22 1418)   lactated ringers 75 mL/hr at 12/23/22 0054   vancomycin 1,500 mg (12/23/22 2305)     LOS: 2 days    Time spent: 45 minutes spent on chart review, discussion with nursing staff, consultants, updating family and interview/physical exam; more than 50% of that time was spent in counseling and/or coordination of care.    Joseph Art, DO [facility] Available via Epic secure chat 7am-7pm After these hours, please refer to coverage provider listed on [email or web address] 12/24/2022, 10:32 AM

## 2022-12-24 NOTE — Progress Notes (Signed)
    [facility]   Reason for visit: Follow up on osteomyelitis  Interval History: continues to have pain in her foot, remains afebrile;she sates she has difficulty taking pills, though no reason why given.  She prefers IV antibiotics but is not willing to pay anything for them.  Day 3 antibiotics  Physical Exam: Constitutional:  Vitals:   12/24/22 0404 12/24/22 0740  BP: 117/85 120/80  Pulse: 81 68  Resp: 16 16  Temp: 99.1 F (37.3 C) 98.3 F (36.8 C)  SpO2: 98% 98%   patient appears in NAD Respiratory: Normal respiratory effort MS: right foot with soft tissue swelling, 5th toe swelling, warmth, decreased compared to yesterday  Review of Systems: Constitutional: negative for fevers and chills  Lab Results  Component Value Date   WBC 8.0 12/23/2022   HGB 12.8 12/23/2022   HCT 35.5 (L) 12/23/2022   MCV 80.7 12/23/2022   PLT 283 12/23/2022    Lab Results  Component Value Date   CREATININE 0.91 12/23/2022   BUN 9 12/23/2022   NA 138 12/23/2022   K 4.1 12/23/2022   CL 105 12/23/2022   CO2 27 12/23/2022    Lab Results  Component Value Date   ALT 10 12/22/2022   AST 14 (L) 12/22/2022   ALKPHOS 65 12/22/2022     Microbiology: Recent Results (from the past 240 hour(s))  Blood Cultures x 2 sites     Status: None (Preliminary result)   Collection Time: 12/22/22  9:24 PM   Specimen: BLOOD  Result Value Ref Range Status   Specimen Description BLOOD LEFT ANTECUBITAL  Final   Special Requests   Final    BOTTLES DRAWN AEROBIC AND ANAEROBIC Blood Culture results may not be optimal due to an inadequate volume of blood received in culture bottles   Culture   Final    NO GROWTH < 24 HOURS Performed at [facility], [address] [place], [place] [address]    Report Status PENDING  Incomplete  Blood Cultures x 2 sites     Status: None (Preliminary result)   Collection Time: 12/22/22  9:29 PM   Specimen: BLOOD  Result Value Ref Range  Status   Specimen Description BLOOD BLOOD RIGHT FOREARM  Final   Special Requests   Final    BOTTLES DRAWN AEROBIC AND ANAEROBIC Blood Culture adequate volume   Culture   Final    NO GROWTH < 24 HOURS Performed at [facility], [address] [place], [place] [address]    Report Status PENDING  Incomplete    Impression/Plan:  1. Foot osteomyelitis - MRI of the right foot concerning for soft tissue swelling and concern for acute osteomyelitis of the fifth toe.  On my exam, it appears somewhat improved today overall on empiric antibiotics.  CRP and ESR  At this point, will plan on prolonged antibiotics and recommend oral doxycycline 100 mg twice a day and cefadroxil 1000 mg twice a day for one month with follow up to check on improvement.   2.  Tobacco abuse - recommended cessation.    3.  Medication monitoring - creat is stable with no issues.   Will sign off at this point and will arrange follow up in our clinic

## 2022-12-24 NOTE — TOC CM/SW Note (Signed)
.  Transition of Care [facility]) - Inpatient Brief Assessment   Patient Details  Name: Renee Herrera MRN: [id number] Date of Birth: [birth year]  Transition of Care [facility]) CM/SW Contact:    Epifanio Lesches, RN Phone Number: 12/24/2022, 1:50 PM   Clinical Narrative:    - R fifth toe osteo From home with daughter. States works part- time. Pt without insurance. No PCP. NCM made referral with [facility] for Medicaid screening.  NCM arranged post hospital appointment with [facility] and noted on AVS. Pt states without transportation issues.  TOC team following for needs.....  Transition of Care Asessment: Insurance and Status: Insurance coverage has been reviewed (pt without insurance) Patient has primary care physician: No (appointment arranged with [facility] to establish primacy care)   Prior level of function:: PTA  independent , no DME usage Prior/Current Home Services: No current home services Social Determinants of Health Reivew: SDOH reviewed no interventions necessary Readmission risk has been reviewed: No Transition of care needs: transition of care needs identified, TOC will continue to follow

## 2022-12-25 LAB — BASIC METABOLIC PANEL
Anion gap: 9 (ref 5–15)
BUN: 8 mg/dL (ref 6–20)
CO2: 23 mmol/L (ref 22–32)
Calcium: 8.5 mg/dL — ABNORMAL LOW (ref 8.9–10.3)
Chloride: 101 mmol/L (ref 98–111)
Creatinine, Ser: 0.76 mg/dL (ref 0.44–1.00)
GFR, Estimated: >60 mL/min (ref >60)
Glucose, Bld: 85 mg/dL (ref 70–99)
Potassium: 3.3 mmol/L — ABNORMAL LOW (ref 3.5–5.1)
Sodium: 133 mmol/L — ABNORMAL LOW (ref 135–145)

## 2022-12-25 LAB — CBC
HCT: 35.3 % — ABNORMAL LOW (ref 36.0–46.0)
Hemoglobin: 12.7 g/dL (ref 12.0–15.0)
MCH: 28.6 pg (ref 26.0–34.0)
MCHC: 36 g/dL (ref 30.0–36.0)
MCV: 79.5 fL — ABNORMAL LOW (ref 80.0–100.0)
Platelets: 271 10*3/uL (ref 150–400)
RBC: 4.44 MIL/uL (ref 3.87–5.11)
RDW: 13.5 % (ref 11.5–15.5)
WBC: 10.2 10*3/uL (ref 4.0–10.5)
nRBC: 0 % (ref 0.0–0.2)

## 2022-12-25 LAB — CULTURE, BLOOD (ROUTINE X 2)

## 2022-12-25 MED ORDER — POTASSIUM CHLORIDE CRYS ER 20 MEQ PO TBCR
40.0000 meq | EXTENDED_RELEASE_TABLET | Freq: Once | ORAL | Status: AC
  Administered 2022-12-25: 40 meq via ORAL
  Filled 2022-12-25: qty 2

## 2022-12-25 NOTE — Plan of Care (Signed)
  Problem: Activity: Goal: Risk for activity intolerance will decrease Outcome: Progressing   Problem: Coping: Goal: Level of anxiety will decrease Outcome: Progressing   Problem: Elimination: Goal: Will not experience complications related to bowel motility Outcome: Progressing Goal: Will not experience complications related to urinary retention Outcome: Progressing   

## 2022-12-25 NOTE — Progress Notes (Signed)
Pharmacy Antibiotic Note  Renee Herrera is a 47 y.o. female admitted on 12/22/2022 with osteomyelitis.  Pharmacy has been consulted for Vancomycin and Cefepime dosing.  Pt is on D4 abx for likely osteo. Wbc wnl. Plan for IV abx while here then a month of doxy/cefadroxil at discharge. We will hold off level for now as likely to be dced home soon.   Plan: Vancomycin 1500mg  Q24H (eAUC 524, Scr 1.08, Vd 0.72) Cefepime 2g Q8H  Height: 5\' 6"  (167.6 cm) Weight: 73 kg (160 lb 15 oz) IBW/kg (Calculated) : 59.3  Temp (24hrs), Avg:99 F (37.2 C), Min:97.9 F (36.6 C), Max:100.1 F (37.8 C)  Recent Labs  Lab 12/22/22 2020 12/23/22 0842 12/25/22 0222  WBC 9.1 8.0 10.2  CREATININE 1.08* 0.91 0.76  LATICACIDVEN 0.7  --   --      Estimated Creatinine Clearance: 88.9 mL/min (by C-G formula based on SCr of 0.76 mg/dL).    No Known Allergies   Vanc 6/30 >> Cefepime 6/30 >>7/1 Ceftriaxone 7/1>>   6/30 blood>> ngtd  Ulyses Southward, PharmD, [place], AAHIVP, CPP Infectious Disease Pharmacist 12/25/2022 1:19 PM

## 2022-12-25 NOTE — Progress Notes (Signed)
Pt. has poor appetite, "states she does not eat well at home also". No complaints of abdomen pain or nausea, simply just dose not want to eat. This nurse offered ensure and pt. Refused.

## 2022-12-25 NOTE — Progress Notes (Signed)
PROGRESS NOTE    Renee Herrera  LOV:[id number] DOB: 1975-07-15 DOA: 12/22/2022 PCP: Pcp, No    Brief Narrative:  Renee Herrera is a 47 y.o. female with no significant medical history who presented with several weeks of right foot pain.  Mainly involving a wound between her fourth and fifth right toes.  She initially presented to the ED in May, was thought to have a foot fungal infection.  Her symptoms did not improve with outpatient management.  She continued to have pain in her right foot, worsened with walking.  For the past 24 hours she noted that the dorsum part of her right foot was swollen, erythematous, tender and warm to the touch.  She presented to urgent care today where an x-ray of her right foot was done.  It revealed findings concerning for possible osteomyelitis of the fifth toe.  The patient was sent to the ED for further evaluation and management of R foot cellulitis and possible 5th toe osteomyelitis. Seen by ID and ortho.  Plan for IV abx for now   Assessment and Plan:  Right foot dorsum cellulitis and suspected right fifth toe osteomyelitis, poa -MRI was done with the findings suspicious for acute osteomyelitis of a proximal middle phalanges and possible distal phalanx of the fifth toe. -ortho consult-- recommend abx and outpatient follow up - IV antibiotics: Patient remains on vancomycin and ceftriaxone.  ID recommends changing to doxycycline and cefadroxil at discharge. -Pain control with bowel regimen WBC noted to be higher today compared to yesterday.  Will recheck labs tomorrow. Patient mentions that the foot has not improved much.   Tobacco use disorder Tobacco cessation recommended   Polysubstance abuse Polysubstance cessation recommended  DVT prophylaxis: Lovenox Code Status: Full Code Disposition Plan: Patient to return home when improved   Consultants:  Ortho ID   Subjective: Continues to have redness over the foot and also complains of pain.   Denies any nausea vomiting.    Objective: Vitals:   12/24/22 1441 12/24/22 2022 12/25/22 0544 12/25/22 0806  BP: 111/73 132/75 117/75 113/77  Pulse: 64 82 81 90  Resp: 18 19 19    Temp: 98.8 F (37.1 C) 100.1 F (37.8 C) 99.2 F (37.3 C) 97.9 F (36.6 C)  TempSrc:  Oral Oral Oral  SpO2: 98% 98% 99% 99%  Weight:      Height:        Intake/Output Summary (Last 24 hours) at 12/25/2022 1058 Last data filed at 12/24/2022 2119 Gross per 24 hour  Intake 200 ml  Output --  Net 200 ml    Filed Weights   12/22/22 2200  Weight: 73 kg    Examination:  General appearance: Awake alert.  In no distress Resp: Clear to auscultation bilaterally.  Normal effort Cardio: S1-S2 is normal regular.  No S3-S4.  No rubs murmurs or bruit GI: Abdomen is soft.  Nontender nondistended.  Bowel sounds are present normal.  No masses organomegaly Extremities: Swelling and erythema noted over the right foot dorsal area.  Tender to touch.  Warm to touch.  Good pulses. Neurologic: Alert and oriented x3.  No focal neurological deficits.     Data Reviewed: I have personally reviewed following labs and imaging studies  CBC: Recent Labs  Lab 12/22/22 2020 12/23/22 0842 12/25/22 0222  WBC 9.1 8.0 10.2  NEUTROABS 6.1  --   --   HGB 13.6 12.8 12.7  HCT 37.5 35.5* 35.3*  MCV 80.0 80.7 79.5*  PLT 310 283  271    Basic Metabolic Panel: Recent Labs  Lab 12/22/22 2020 12/23/22 0842 12/25/22 0222  NA 137 138 133*  K 3.5 4.1 3.3*  CL 101 105 101  CO2 25 27 23   GLUCOSE 102* 135* 85  BUN 12 9 8   CREATININE 1.08* 0.91 0.76  CALCIUM 9.5 8.6* 8.5*  MG  --  1.9  --   PHOS  --  3.5  --     GFR: Estimated Creatinine Clearance: 88.9 mL/min (by C-G formula based on SCr of 0.76 mg/dL). Liver Function Tests: Recent Labs  Lab 12/22/22 2020  AST 14*  ALT 10  ALKPHOS 65  BILITOT 0.5  PROT 6.8  ALBUMIN 3.7     Sepsis Labs: Recent Labs  Lab 12/22/22 2020  LATICACIDVEN 0.7     Recent Results  (from the past 240 hour(s))  Blood Cultures x 2 sites     Status: None (Preliminary result)   Collection Time: 12/22/22  9:24 PM   Specimen: BLOOD  Result Value Ref Range Status   Specimen Description BLOOD LEFT ANTECUBITAL  Final   Special Requests   Final    BOTTLES DRAWN AEROBIC AND ANAEROBIC Blood Culture results may not be optimal due to an inadequate volume of blood received in culture bottles   Culture   Final    NO GROWTH 3 DAYS Performed at [facility], [address] [place], [place] [address]    Report Status PENDING  Incomplete  Blood Cultures x 2 sites     Status: None (Preliminary result)   Collection Time: 12/22/22  9:29 PM   Specimen: BLOOD  Result Value Ref Range Status   Specimen Description BLOOD BLOOD RIGHT FOREARM  Final   Special Requests   Final    BOTTLES DRAWN AEROBIC AND ANAEROBIC Blood Culture adequate volume   Culture   Final    NO GROWTH 3 DAYS Performed at [facility], [address] [place], [place] [address]    Report Status PENDING  Incomplete         Radiology Studies: No results found.   Scheduled Meds:  enoxaparin (LOVENOX) injection  40 mg Subcutaneous Q24H   Continuous Infusions:  cefTRIAXone (ROCEPHIN)  IV Stopped (12/24/22 1338)   vancomycin 1,500 mg (12/24/22 2254)     LOS: 3 days     Osvaldo Shipper,  [facility] Available via Epic secure chat 7am-7pm After these hours, please refer to coverage provider listed on [email or web address] 12/25/2022, 10:58 AM

## 2022-12-25 NOTE — Evaluation (Signed)
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  Has ID and ortho consult.  Currently managing with IV abx.  Pt with hx including anxiety, depression  Clinical Impression  Pt admitted with above diagnosis. At baseline , pt is independent.  She does live in 2nd floor apartment without elevator.  Today, pt limited by pain in R foot.  She took a few steps and basically NWB due to pain.  Pt had increased pain and impulsively turned, reached for bed, and returned to bed.  Pt will likely need AD (RW vs crutches) at d/c for pain control and also may benefit from post op shoe.  Expected to progress and no PT needs at d/c as pain improves.  Pt currently with functional limitations due to the deficits listed below (see PT Problem List). Pt will benefit from acute skilled PT to increase their independence and safety with mobility to allow discharge.           Assistance Recommended at Discharge Intermittent Supervision/Assistance  If plan is discharge home, recommend the following:  Can travel by private vehicle  A little help with bathing/dressing/bathroom;A little help with walking and/or transfers;Assistance with cooking/housework;Help with stairs or ramp for entrance        Equipment Recommendations Rolling walker (2 wheels) (vs crutches)  Recommendations for Other Services       Functional Status Assessment Patient has had a recent decline in their functional status and demonstrates the ability to make significant improvements in function in a reasonable and predictable amount of time.     Precautions / Restrictions Precautions Precautions: Fall Restrictions Weight Bearing Restrictions: No      Mobility  Bed Mobility Overal bed mobility: Needs Assistance Bed Mobility: Supine to  Sit, Sit to Supine     Supine to sit: Independent Sit to supine: Independent        Transfers Overall transfer level: Needs assistance Equipment used: Rolling walker (2 wheels) Transfers: Sit to/from Stand, Bed to chair/wheelchair/BSC Sit to Stand: Supervision Stand pivot transfers: Supervision         General transfer comment: Pt has been squat pivoting to bsc on her own.  Demonstrated sit to stand with RW.    Ambulation/Gait Ambulation/Gait assistance: Min guard Gait Distance (Feet): 6 Feet Assistive device: Rolling walker (2 wheels) Gait Pattern/deviations: Decreased weight shift to right Gait velocity: decreased     General Gait Details: Pt tried donning Crocs for ambulation but was too painful.  She took a few steps forward (basically with NWB on R LE due to pain).  Her pain significantly increased and pt stating "I can't do this."  She turned , left RW and reached for bed even though 3' away.  She was able to hop to bed and return to supine. Min guard for Wellsite geologist     Tilt Bed    Modified Rankin (Stroke Patients Only)       Balance Overall balance assessment: Needs assistance Sitting-balance support: No upper extremity supported Sitting balance-Leahy Scale: Good     Standing balance support: Single extremity supported, Bilateral upper extremity supported Standing balance-Leahy Scale: Poor Standing balance comment: Reliant on RW or furniture for support  Pertinent Vitals/Pain Pain Assessment Pain Assessment: 0-10 Pain Score: 10-Worst pain ever (6/10 pre but 10/10 dependent position) Pain Location: R foot Pain Descriptors / Indicators: Discomfort, Throbbing, Shooting Pain Intervention(s): Limited activity within patient's tolerance, Monitored during session, Premedicated before session, Repositioned, Ice applied (elevated)    Home Living Family/patient expects to be  discharged to:: Private residence Living Arrangements: Children (33 yo dtr) Available Help at Discharge: Family;Available PRN/intermittently Type of Home: Apartment Home Access: Stairs to enter Entrance Stairs-Rails: Left Entrance Stairs-Number of Steps: flight   Home Layout: One level Home Equipment: None      Prior Function Prior Level of Function : Independent/Modified Independent;Working/employed             Mobility Comments: 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  Pt very guarded of R foot.  Otherwise WFL    Cervical / Trunk Assessment Cervical / Trunk Assessment: Normal  Communication      Cognition Arousal/Alertness: Awake/alert Behavior During Therapy: Flat affect Overall Cognitive Status: Within Functional Limits for tasks assessed                                          General Comments General comments (skin integrity, edema, etc.): Educated on elevation.  Pt with significantly increased pain but unable to get pain meds at this time.  Provided pt with ice pack.    Exercises     Assessment/Plan    PT Assessment Patient needs continued PT services  PT Problem List Decreased strength;Pain;Decreased range of motion;Decreased activity tolerance;Decreased balance;Decreased mobility;Decreased knowledge of use of DME;Decreased safety awareness       PT Treatment Interventions DME instruction;Therapeutic exercise;Gait training;Balance training;Stair training;Functional mobility training;Therapeutic activities;Patient/family education;Modalities    PT Goals (Current goals can be found in the Care Plan section)  Acute Rehab PT  Goals Patient Stated Goal: decrease pain PT Goal Formulation: With patient Time For Goal Achievement: 01/08/23 Potential to Achieve Goals: Good    Frequency Min 3X/week     Co-evaluation               AM-PAC PT "6 Clicks" Mobility  Outcome Measure Help needed turning from your back to your side while in a flat bed without using bedrails?: A Little Help needed moving from lying on your back to sitting on the side of a flat bed without using bedrails?: A Little Help needed moving to and from a bed to a chair (including a wheelchair)?: A Little Help needed standing up from a chair using your arms (e.g., wheelchair or bedside chair)?: A Little Help needed to walk in hospital room?: A Little Help needed climbing 3-5 steps with a railing? : Total 6 Click Score: 16    End of Session   Activity Tolerance: Patient limited by pain Patient left: in bed;with call bell/phone within reach Nurse Communication: Mobility status PT Visit Diagnosis: Other abnormalities of gait and mobility (R26.89);Pain Pain - Right/Left: Right Pain - part of body: Ankle and joints of foot    Time: 1250-1308 PT Time Calculation (min) (ACUTE ONLY): 18 min  Charges:   PT Evaluation $PT Eval Low Complexity: 1 Low   PT General Charges $$ ACUTE PT VISIT: 1 Visit         Anise Salvo, PT Acute Rehab [facility] [phone]   Rayetta Humphrey 12/25/2022, 1:59 PM

## 2022-12-25 NOTE — Progress Notes (Signed)
Yesterday, pt and sister Debbe Odea asked CSW about medicaid eligibility.  CSW emailed [facility], who responded today, reports pt was screened already, does qualify for medicaid, and they will be submitting an application for her.  Pt informed today. Daleen Squibb, MSW, LCSW 7/3/202412:35 PM

## 2022-12-26 LAB — CBC
HCT: 36.4 % (ref 36.0–46.0)
Hemoglobin: 13.2 g/dL (ref 12.0–15.0)
MCH: 28.6 pg (ref 26.0–34.0)
MCHC: 36.3 g/dL — ABNORMAL HIGH (ref 30.0–36.0)
MCV: 78.8 fL — ABNORMAL LOW (ref 80.0–100.0)
Platelets: 279 10*3/uL (ref 150–400)
RBC: 4.62 MIL/uL (ref 3.87–5.11)
RDW: 13.5 % (ref 11.5–15.5)
WBC: 8.5 10*3/uL (ref 4.0–10.5)
nRBC: 0 % (ref 0.0–0.2)

## 2022-12-26 LAB — BASIC METABOLIC PANEL
Anion gap: 10 (ref 5–15)
BUN: 7 mg/dL (ref 6–20)
CO2: 22 mmol/L (ref 22–32)
Calcium: 8.8 mg/dL — ABNORMAL LOW (ref 8.9–10.3)
Chloride: 102 mmol/L (ref 98–111)
Creatinine, Ser: 0.84 mg/dL (ref 0.44–1.00)
GFR, Estimated: >60 mL/min (ref >60)
Glucose, Bld: 135 mg/dL — ABNORMAL HIGH (ref 70–99)
Potassium: 3.6 mmol/L (ref 3.5–5.1)
Sodium: 134 mmol/L — ABNORMAL LOW (ref 135–145)

## 2022-12-26 LAB — MAGNESIUM: Magnesium: 1.9 mg/dL (ref 1.7–2.4)

## 2022-12-26 LAB — CULTURE, BLOOD (ROUTINE X 2): Culture: NO GROWTH

## 2022-12-26 MED ORDER — POTASSIUM CHLORIDE CRYS ER 20 MEQ PO TBCR
40.0000 meq | EXTENDED_RELEASE_TABLET | Freq: Once | ORAL | Status: AC
  Administered 2022-12-26: 40 meq via ORAL
  Filled 2022-12-26: qty 2

## 2022-12-26 MED ORDER — TERBINAFINE HCL 1 % EX CREA
TOPICAL_CREAM | Freq: Two times a day (BID) | CUTANEOUS | Status: DC
  Filled 2022-12-26: qty 12

## 2022-12-26 NOTE — Progress Notes (Signed)
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  Has ID and ortho consult.  Currently managing with IV abx.  Pt with hx including anxiety, depression    PT Comments  The pt was agreeable to session with focus on stair training. She was able to demo good safety with use of crutches and L rail on stairs, completed 3 steps x2 with good stability and reports no questions or concerns about being able to return home to her house at this time. Pt reports she is able to mobilize as needed in her room independently, but is limited only by pain in her R foot. As pain continues to improve, anticipate no further acute PT needs.      Assistance Recommended at Discharge Intermittent Supervision/Assistance  If plan is discharge home, recommend the following:  Can travel by private vehicle    A little help with bathing/dressing/bathroom;A little help with walking and/or transfers;Assistance with cooking/housework;Help with stairs or ramp for entrance      Equipment Recommendations  Crutches    Recommendations for Other Services       Precautions / Restrictions Precautions Precautions: Fall Restrictions Weight Bearing Restrictions: No     Mobility  Bed Mobility Overal bed mobility: Independent             General bed mobility comments: pt moving and re-arranging bed without assist, got OOB while PT out of room without any assist    Transfers Overall transfer level: Needs assistance Equipment used: Crutches Transfers: Sit to/from Stand Sit to Stand: Min guard           General transfer comment: minG for safety with crutches, pt able to complete without physical assist    Ambulation/Gait Ambulation/Gait assistance: Min guard Gait Distance (Feet): 5 Feet (x4) Assistive  device: Crutches Gait Pattern/deviations: Decreased weight shift to right Gait velocity: decreased Gait velocity interpretation: <1.31 ft/sec, indicative of household ambulator   General Gait Details: limited to short distance ambulation with crutches to focus on stair training this session. pt maintaining NWB RLE   Stairs Stairs: Yes Stairs assistance: Min guard Stair Management: One rail Left, With crutches, Step to pattern, Forwards Number of Stairs: 3 General stair comments: step to with R heel WB on step    Balance Overall balance assessment: Needs assistance Sitting-balance support: No upper extremity supported Sitting balance-Leahy Scale: Good     Standing balance support: Single extremity supported, Bilateral upper extremity supported Standing balance-Leahy Scale: Poor Standing balance comment: pt able to static stand with single UE support for self-care, benefits from increased assist and                            Cognition Arousal/Alertness: Awake/alert Behavior During Therapy: Flat affect Overall Cognitive Status: Within Functional Limits for tasks assessed                                 General Comments: pt slightly impulsive but able to answer questions appropriately.        Exercises      General Comments General comments (skin integrity, edema, etc.): pt with RLE elevated and iced again      Pertinent Vitals/Pain Pain Assessment Pain  Assessment: Faces Faces Pain Scale: Hurts little more Pain Location: R foot (especially along the top) Pain Descriptors / Indicators: Discomfort, Throbbing, Shooting Pain Intervention(s): Limited activity within patient's tolerance, Monitored during session, Premedicated before session, Repositioned     PT Goals (current goals can now be found in the care plan section) Acute Rehab PT Goals Patient Stated Goal: decrease pain PT Goal Formulation: With patient Time For Goal Achievement:  01/08/23 Potential to Achieve Goals: Good Progress towards PT goals: Progressing toward goals    Frequency    Min 3X/week      PT Plan Current plan remains appropriate       AM-PAC PT "6 Clicks" Mobility   Outcome Measure  Help needed turning from your back to your side while in a flat bed without using bedrails?: A Little Help needed moving from lying on your back to sitting on the side of a flat bed without using bedrails?: A Little Help needed moving to and from a bed to a chair (including a wheelchair)?: A Little Help needed standing up from a chair using your arms (e.g., wheelchair or bedside chair)?: A Little Help needed to walk in hospital room?: A Little Help needed climbing 3-5 steps with a railing? : A Little 6 Click Score: 18    End of Session Equipment Utilized During Treatment: Gait belt Activity Tolerance: Patient limited by pain Patient left: in bed;with call bell/phone within reach Nurse Communication: Mobility status PT Visit Diagnosis: Other abnormalities of gait and mobility (R26.89);Pain Pain - Right/Left: Right Pain - part of body: Ankle and joints of foot     Time: 4098-1191 PT Time Calculation (min) (ACUTE ONLY): 17 min  Charges:    $Gait Training: 8-22 mins PT General Charges $$ ACUTE PT VISIT: 1 Visit                     Vickki Muff, PT, DPT   Acute Rehabilitation Department Office [phone] Secure Chat Communication Preferred   Ronnie Derby 12/26/2022, 3:08 PM

## 2022-12-26 NOTE — Plan of Care (Signed)

## 2022-12-26 NOTE — Progress Notes (Signed)
PROGRESS NOTE    [name]  GNF:[id number] DOB: 1976-04-17 DOA: 12/22/2022 PCP: Pcp, No    Brief Narrative:  ARABEL Herrera is a 47 y.o. female with no significant medical history who presented with several weeks of right foot pain.  Mainly involving a wound between her fourth and fifth right toes.  She initially presented to the ED in May, was thought to have a foot fungal infection.  Her symptoms did not improve with outpatient management.  She continued to have pain in her right foot, worsened with walking.  For the past 24 hours she noted that the dorsum part of her right foot was swollen, erythematous, tender and warm to the touch.  She presented to urgent care today where an x-ray of her right foot was done.  It revealed findings concerning for possible osteomyelitis of the fifth toe.  The patient was sent to the ED for further evaluation and management of R foot cellulitis and possible 5th toe osteomyelitis. Seen by ID and ortho.  Plan for IV abx for now   Assessment and Plan:  Right foot dorsum cellulitis and suspected right fifth toe osteomyelitis, poa -MRI was done with the findings suspicious for acute osteomyelitis of a proximal middle phalanges and possible distal phalanx of the fifth toe. -ortho consult-- recommend abx and outpatient follow up - IV antibiotics: Patient remains on vancomycin and ceftriaxone.  ID recommends changing to doxycycline and cefadroxil at discharge. -Pain control with bowel regimen Seems to be gradually improving.  WBC is normal.  She remains afebrile.  Continue IV antibiotics for today.  Possible transition to oral antibiotics tomorrow.  Mobilize.   Tobacco use disorder Tobacco cessation recommended   Polysubstance abuse Polysubstance cessation recommended  DVT prophylaxis: Lovenox Code Status: Full Code Disposition Plan: Patient to return home when improved   Consultants:  Ortho ID   Subjective: Patient does not think that the foot  is improving though there is objective signs of the erythema is better today compared to yesterday.  No other complaints offered.  Patient reassured.  Objective: Vitals:   12/25/22 1323 12/25/22 2157 12/26/22 0406 12/26/22 0829  BP: 115/89 130/81 102/73 103/70  Pulse: 62 63 (!) 55 62  Resp: 20 14 16 20   Temp: 98.6 F (37 C) 98.9 F (37.2 C) 98.4 F (36.9 C) 98 F (36.7 C)  TempSrc: Oral Oral Oral Oral  SpO2: 99% 98% 98% 97%  Weight:      Height:        Intake/Output Summary (Last 24 hours) at 12/26/2022 1122 Last data filed at 12/26/2022 0829 Gross per 24 hour  Intake 120 ml  Output --  Net 120 ml    Filed Weights   12/22/22 2200  Weight: 73 kg    Examination:  General appearance: Awake alert.  In no distress Resp: Clear to auscultation bilaterally.  Normal effort Cardio: S1-S2 is normal regular.  No S3-S4.  No rubs murmurs or bruit GI: Abdomen is soft.  Nontender nondistended.  Bowel sounds are present normal.  No masses organomegaly Extremities: Improvement in erythema noted over the dorsal aspect of right foot.  Still remains warm to touch and tender to palpate.  No fluctuations identified.  Good pedal pulses. Neurologic: Alert and oriented x3.  No focal neurological deficits.     Data Reviewed: I have personally reviewed following labs and imaging studies  CBC: Recent Labs  Lab 12/22/22 2020 12/23/22 0842 12/25/22 0222 12/26/22 0311  WBC 9.1 8.0 10.2  8.5  NEUTROABS 6.1  --   --   --   HGB 13.6 12.8 12.7 13.2  HCT 37.5 35.5* 35.3* 36.4  MCV 80.0 80.7 79.5* 78.8*  PLT 310 283 271 279    Basic Metabolic Panel: Recent Labs  Lab 12/22/22 2020 12/23/22 0842 12/25/22 0222 12/26/22 0311  NA 137 138 133* 134*  K 3.5 4.1 3.3* 3.6  CL 101 105 101 102  CO2 25 27 23 22   GLUCOSE 102* 135* 85 135*  BUN 12 9 8 7   CREATININE 1.08* 0.91 0.76 0.84  CALCIUM 9.5 8.6* 8.5* 8.8*  MG  --  1.9  --  1.9  PHOS  --  3.5  --   --     GFR: Estimated Creatinine  Clearance: 84.7 mL/min (by C-G formula based on SCr of 0.84 mg/dL).  Liver Function Tests: Recent Labs  Lab 12/22/22 2020  AST 14*  ALT 10  ALKPHOS 65  BILITOT 0.5  PROT 6.8  ALBUMIN 3.7     Sepsis Labs: Recent Labs  Lab 12/22/22 2020  LATICACIDVEN 0.7     Recent Results (from the past 240 hour(s))  Blood Cultures x 2 sites     Status: None (Preliminary result)   Collection Time: 12/22/22  9:24 PM   Specimen: BLOOD  Result Value Ref Range Status   Specimen Description BLOOD LEFT ANTECUBITAL  Final   Special Requests   Final    BOTTLES DRAWN AEROBIC AND ANAEROBIC Blood Culture results may not be optimal due to an inadequate volume of blood received in culture bottles   Culture   Final    NO GROWTH 3 DAYS Performed at [facility], [address], [place], [place] [address]    Report Status PENDING  Incomplete  Blood Cultures x 2 sites     Status: None (Preliminary result)   Collection Time: 12/22/22  9:29 PM   Specimen: BLOOD  Result Value Ref Range Status   Specimen Description BLOOD BLOOD RIGHT FOREARM  Final   Special Requests   Final    BOTTLES DRAWN AEROBIC AND ANAEROBIC Blood Culture adequate volume   Culture   Final    NO GROWTH 3 DAYS Performed at [facility], [address], [place], [place] [address]    Report Status PENDING  Incomplete         Radiology Studies: No results found.   Scheduled Meds:  enoxaparin (LOVENOX) injection  40 mg Subcutaneous Q24H   Continuous Infusions:  cefTRIAXone (ROCEPHIN)  IV 2 g (12/25/22 1344)   vancomycin 1,500 mg (12/25/22 2238)     LOS: 4 days     Osvaldo Shipper,  [facility] Available via Epic secure chat 7am-7pm After these hours, please refer to coverage provider listed on [email or web address] 12/26/2022, 11:22 AM

## 2022-12-26 NOTE — Progress Notes (Signed)
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  Has ID and ortho consult.  Currently managing with IV abx.  Pt with hx including anxiety, depression    PT Comments  The pt was agreeable to session, and able to complete significantly more mobility in the room than on initial evaluation. However, progression to stair training this morning still limited by pt's pain when foot in dependent position. She was safely able to use crutches and RW, will plan to return later today after next dose of pain medications to complete stair training to determine best DME for return home (pt with flight of stairs to enter her home).      Assistance Recommended at Discharge Intermittent Supervision/Assistance  If plan is discharge home, recommend the following:  Can travel by private vehicle    A little help with bathing/dressing/bathroom;A little help with walking and/or transfers;Assistance with cooking/housework;Help with stairs or ramp for entrance      Equipment Recommendations  Rolling walker (2 wheels) (vs crutches (will decide after stair training))    Recommendations for Other Services       Precautions / Restrictions Precautions Precautions: Fall Restrictions Weight Bearing Restrictions: No     Mobility  Bed Mobility Overal bed mobility: Independent             General bed mobility comments: pt moving and re-arranging bed without assist, got OOB while PT out of room without any assist    Transfers Overall transfer level: Needs assistance Equipment used: None Transfers: Sit to/from Stand Sit to Stand: Min guard           General transfer comment: pt getting OOB and walking in room without supervision or assist, has been pivoting to [facility] throughout morning. however,  upon stand fro mlow toilet pt reachign for assist and needed [name] to maintain balance.    Ambulation/Gait Ambulation/Gait assistance: Min guard Gait Distance (Feet): 25 Feet Assistive device: Rolling walker (2 wheels), None, Crutches Gait Pattern/deviations: Decreased weight shift to right Gait velocity: decreased Gait velocity interpretation: <1.31 ft/sec, indicative of household ambulator   General Gait Details: pt walking with RW independently in room upon arrival of PT, mixture of NWB and TDWB due to pain. was able to safely manage use of crutches in the room, declined further ambulation to stairs due to pain      Balance Overall balance assessment: Needs assistance Sitting-balance support: No upper extremity supported Sitting balance-Leahy Scale: Good     Standing balance support: Single extremity supported, Bilateral upper extremity supported Standing balance-Leahy Scale: Poor Standing balance comment: pt able to static stand with single UE support for self-care, benefits from increased assist and                            Cognition Arousal/Alertness: Awake/alert Behavior During Therapy: Flat affect Overall Cognitive Status: Within Functional Limits for tasks assessed                                 General Comments: pt slightly impulsive but able to answer questions appropriately.        Exercises      General Comments General comments (skin integrity, edema, etc.): pt left  with RLE elevated and iced, RN present giving medications. will attempt to return later for stair training      Pertinent Vitals/Pain Pain Assessment Pain Assessment: Faces Faces Pain Scale: Hurts even more Pain Location: R foot (especially along the top) Pain Descriptors / Indicators: Discomfort, Throbbing, Shooting Pain Intervention(s): Limited activity within patient's tolerance, Monitored during session, RN gave pain meds during session, Repositioned, Ice  applied     PT Goals (current goals can now be found in the care plan section) Acute Rehab PT Goals Patient Stated Goal: decrease pain PT Goal Formulation: With patient Time For Goal Achievement: 01/08/23 Potential to Achieve Goals: Good Progress towards PT goals: Progressing toward goals    Frequency    Min 3X/week      PT Plan Current plan remains appropriate       AM-PAC PT "6 Clicks" Mobility   Outcome Measure  Help needed turning from your back to your side while in a flat bed without using bedrails?: A Little Help needed moving from lying on your back to sitting on the side of a flat bed without using bedrails?: A Little Help needed moving to and from a bed to a chair (including a wheelchair)?: A Little Help needed standing up from a chair using your arms (e.g., wheelchair or bedside chair)?: A Little Help needed to walk in hospital room?: A Little Help needed climbing 3-5 steps with a railing? : Total 6 Click Score: 16    End of Session   Activity Tolerance: Patient limited by pain Patient left: in bed;with call bell/phone within reach Nurse Communication: Mobility status PT Visit Diagnosis: Other abnormalities of gait and mobility (R26.89);Pain Pain - Right/Left: Right Pain - part of body: Ankle and joints of foot     Time: 1010-1035 PT Time Calculation (min) (ACUTE ONLY): 25 min  Charges:    $Gait Training: 8-22 mins $Therapeutic Activity: 8-22 mins PT General Charges $$ ACUTE PT VISIT: 1 Visit                     Vickki Muff, PT, DPT   Acute Rehabilitation Department Office [phone] Secure Chat Communication Preferred   Ronnie Derby 12/26/2022, 11:40 AM

## 2022-12-27 ENCOUNTER — Other Ambulatory Visit (HOSPITAL_COMMUNITY): Payer: Self-pay

## 2022-12-27 LAB — CULTURE, BLOOD (ROUTINE X 2): Special Requests: ADEQUATE

## 2022-12-27 MED ORDER — OXYCODONE HCL 5 MG PO TABS
5.0000 mg | ORAL_TABLET | Freq: Four times a day (QID) | ORAL | 0 refills | Status: DC | PRN
  Filled 2022-12-27: qty 30, 7d supply, fill #0

## 2022-12-27 MED ORDER — TERBINAFINE HCL 1 % EX CREA
TOPICAL_CREAM | Freq: Two times a day (BID) | CUTANEOUS | 0 refills | Status: DC
  Filled 2022-12-27: qty 15, 7d supply, fill #0

## 2022-12-27 NOTE — Progress Notes (Signed)
Discharge summary packet provided to pt with instructions. Pt verbalized understanding of instructions. No complaints. Crutches and post op boot delivered to room. TOC meds to follow. Pt d/c to home as ordered. Primary nurse is aware of above. Family is responsible for pt's ride.

## 2022-12-27 NOTE — Discharge Summary (Signed)
[facility]  Physician Discharge Summary   Patient ID: DOMNIQUE RAYSOR MRN: [id number] DOB/AGE: 1975/09/20 47 y.o.  Admit date: 12/22/2022 Discharge date: 12/27/2022    PCP: Pcp, No  DISCHARGE DIAGNOSES:  Right fifth toe osteomyelitis (HCC) Right foot cellulitis   RECOMMENDATIONS FOR OUTPATIENT FOLLOW UP: ID to arrange outpatient follow-up  Home Health: None Equipment/Devices: Crutches  CODE STATUS: Full code  DISCHARGE CONDITION: fair  Diet recommendation: As before  INITIAL HISTORY: 47 y.o. female with no significant medical history who presented with several weeks of right foot pain.  Mainly involving a wound between her fourth and fifth right toes.  She initially presented to the ED in May, was thought to have a foot fungal infection.  Her symptoms did not improve with outpatient management.  She continued to have pain in her right foot, worsened with walking.  For the past 24 hours she noted that the dorsum part of her right foot was swollen, erythematous, tender and warm to the touch.  She presented to urgent care today where an x-ray of her right foot was done.  It revealed findings concerning for possible osteomyelitis of the fifth toe.  The patient was sent to the ED for further evaluation and management of R foot cellulitis and possible 5th toe osteomyelitis. Seen by ID and ortho.  Plan for IV abx for now   HOSPITAL COURSE:   Right foot dorsum cellulitis and suspected right fifth toe osteomyelitis, poa -MRI was done with the findings suspicious for acute osteomyelitis of a proximal middle phalanges and possible distal phalanx of the fifth toe. -ortho consult-- recommend abx and outpatient follow up - IV antibiotics: Patient remains on vancomycin and ceftriaxone.  ID recommends changing to doxycycline and cefadroxil at discharge. Antifungal cream was also prescribed.   Tobacco use disorder Tobacco cessation recommended   Polysubstance abuse Polysubstance  cessation recommended  Patient is stable.  Okay for discharge home today.  PERTINENT LABS:  The results of significant diagnostics from this hospitalization (including imaging, microbiology, ancillary and laboratory) are listed below for reference.    Microbiology: Recent Results (from the past 240 hour(s))  Blood Cultures x 2 sites     Status: None   Collection Time: 12/22/22  9:24 PM   Specimen: BLOOD  Result Value Ref Range Status   Specimen Description BLOOD LEFT ANTECUBITAL  Final   Special Requests   Final    BOTTLES DRAWN AEROBIC AND ANAEROBIC Blood Culture results may not be optimal due to an inadequate volume of blood received in culture bottles   Culture   Final    NO GROWTH 5 DAYS Performed at [facility], [address] [place], [place] [address]    Report Status 12/27/2022 FINAL  Final  Blood Cultures x 2 sites     Status: None   Collection Time: 12/22/22  9:29 PM   Specimen: BLOOD  Result Value Ref Range Status   Specimen Description BLOOD BLOOD RIGHT FOREARM  Final   Special Requests   Final    BOTTLES DRAWN AEROBIC AND ANAEROBIC Blood Culture adequate volume   Culture   Final    NO GROWTH 5 DAYS Performed at [facility], [address], [place], [place] [address]    Report Status 12/27/2022 FINAL  Final     Labs:   Basic Metabolic Panel: Recent Labs  Lab 12/22/22 2020 12/23/22 0842 12/25/22 0222 12/26/22 0311  NA 137 138 133* 134*  K 3.5 4.1 3.3*  3.6  CL 101 105 101 102  CO2 25 27 23 22   GLUCOSE 102* 135* 85 135*  BUN 12 9 8 7   CREATININE 1.08* 0.91 0.76 0.84  CALCIUM 9.5 8.6* 8.5* 8.8*  MG  --  1.9  --  1.9  PHOS  --  3.5  --   --    Liver Function Tests: Recent Labs  Lab 12/22/22 2020  AST 14*  ALT 10  ALKPHOS 65  BILITOT 0.5  PROT 6.8  ALBUMIN 3.7    CBC: Recent Labs  Lab 12/22/22 2020 12/23/22 0842 12/25/22 0222 12/26/22 0311  WBC 9.1 8.0 10.2 8.5  NEUTROABS 6.1  --   --   --   HGB 13.6 12.8 12.7 13.2   HCT 37.5 35.5* 35.3* 36.4  MCV 80.0 80.7 79.5* 78.8*  PLT 310 283 271 279     IMAGING STUDIES MR FOOT RIGHT WO CONTRAST  Result Date: 12/23/2022 CLINICAL DATA:  Several weeks of right foot pain. Wound between fourth and fifth toes. Initially presented to the ED 10/2022 horizontal foot fungal infection. For the past 24 hours dorsal right foot is swollen, erythematous, tender, and warm to the touch. Urgent care x-ray revealed findings concerning for possible osteomyelitis of the fifth toe. EXAM: MRI OF THE RIGHT FOREFOOT WITHOUT CONTRAST TECHNIQUE: Multiplanar, multisequence MR imaging of the right forefoot was performed. No intravenous contrast was administered. COMPARISON:  Right foot radiographs 12/22/2022, 10/31/2022 FINDINGS: Bones/Joint/Cartilage On yesterday's right foot radiographs there was a minimal 1.5 mm possible lucency at the distal medial aspect of the proximal phalanx of fifth toe that may be new from 10/31/2022. On the current MRI, there is moderate fifth toe soft tissue swelling and edema. There is high-grade increased T2 signal throughout the shaft through the distal aspect of the proximal phalanx and throughout the middle phalanx and likely mildly within the distal phalanx of the fifth toe on axial T2 weighted images (axial series 5 images 26 through 33). This marrow edema appears to be confirmed on sagittal STIR series 4 images 4 and 5 and coronal STIR series 6 images 12 and 13). There may be mild cortical erosion within portions of the lateral aspect of the middle phalanx (axial series 2, image 31) and lateral aspect of the distal phalanx (axial series 2, image 33 and 34), suspicious for acute osteomyelitis. There appears to be edema at the interface of the bone cortex and the surrounding musculature of the majority of the fourth metatarsal shaft (axial series 5 images 12 through 22 and coronal series 6, image 12), however no cortical defect or marrow edema is seen. Trace edema is also  seen medial to the medial cortex of the mid fifth metatarsal shaft (axial series 5, image 13). Moderate hallux valgus.  No acute fracture is seen. Ligaments The Lisfranc ligament complex is intact. The medial and lateral metatarsophalangeal and interphalangeal ligaments appear intact. Muscles and Tendons Mild edema within the lumbar fall muscles around the fourth metatarsal shaft. No tendon tear is identified. Soft tissues There is moderate dorsal lateral greater than medial infection forefoot soft tissue swelling extending into the subcutaneous fat of the fifth toe greater than the fourth toe, and the subcutaneous fat plantar to the third through fifth toe proximal aspect of the proximal phalanges. IMPRESSION: 1. Moderate dorsal lateral greater than medial forefoot soft tissue swelling and edema extending into the subcutaneous fat of the fifth toe greater than the fourth toe, and the subcutaneous fat plantar to the third  through fifth toe proximal phalanges. 2. Findings suspicious for acute osteomyelitis of a proximal middle phalanges and possible distal phalanx of the fifth toe. 3. There appears to be edema at the interface of the bone cortex and the surrounding musculature of the majority of the fourth metatarsal shaft, however no cortical defect or marrow edema is seen to indicate acute osteomyelitis. 4. Moderate hallux valgus. Electronically Signed   By: Neita Garnet M.D.   On: 12/23/2022 11:40   DG Chest 2 View  Result Date: 12/22/2022 CLINICAL DATA:  Edema/infection EXAM: CHEST - 2 VIEW COMPARISON:  07/22/2018 FINDINGS: Lungs are clear.  No pleural effusion or pneumothorax. The heart is normal in size. Visualized osseous structures are within normal limits. IMPRESSION: Normal chest radiographs. Electronically Signed   By: Charline Bills M.D.   On: 12/22/2022 20:48   DG Foot Complete Right  Result Date: 12/22/2022 CLINICAL DATA:  Pain between the fourth and fifth toes for 3 days. Diffuse dorsal  swelling. Evaluate for osteomyelitis. I for metatarsophalangeal fracture of the fifth toe. No known injury. EXAM: RIGHT FOOT COMPLETE - 3+ VIEW COMPARISON:  Right foot radiographs 10/31/2022 FINDINGS: Moderate fifth and mild-to-moderate fourth toe soft tissue swelling appears similar to 10/31/2022. On oblique view there is a 1.5 mm lucency at the distal medial aspect of the proximal phalanx of the fifth toe that appears new from 10/31/2022, although slight differences in projection may be why this is not seen on the prior study. A new cortical erosion in the setting of fifth toe soft tissue swelling is possible. No subcutaneous air. Moderate hallux valgus is unchanged. No acute fracture or dislocation. IMPRESSION: 1. Moderate fifth and mild-to-moderate fourth toe soft tissue swelling appears similar to 10/31/2022. 2. New 1.5 mm lucency at the distal medial aspect of the proximal phalanx of the fifth toe may represent a new cortical erosion. Given the fifth toe soft tissue swelling, recommend clinical correlation for possible posttraumatic change versus possible very early osteomyelitis. 3. Moderate hallux valgus. Electronically Signed   By: Neita Garnet M.D.   On: 12/22/2022 18:57    DISCHARGE EXAMINATION: Vitals:   12/26/22 1502 12/26/22 2009 12/27/22 0314 12/27/22 0900  BP: (!) 134/98 120/72 133/83 105/80  Pulse: 62 71 (!) 59 94  Resp: 18 19 19    Temp: 98 F (36.7 C) 98.1 F (36.7 C) 99.6 F (37.6 C)   TempSrc:   Oral   SpO2: 100% 99% 99% 99%  Weight:      Height:       General appearance: Awake alert.  In no distress Resp: Clear to auscultation bilaterally.  Normal effort Cardio: S1-S2 is normal regular.  No S3-S4.  No rubs murmurs or bruit GI: Abdomen is soft.  Nontender nondistended.  Bowel sounds are present normal.  No masses organomegaly Extremities: Improving erythema over the right foot.   DISPOSITION: Home  Discharge Instructions     Call MD for:  difficulty breathing,  headache or visual disturbances   Complete by: As directed    Call MD for:  extreme fatigue   Complete by: As directed    Call MD for:  persistant dizziness or light-headedness   Complete by: As directed    Call MD for:  persistant nausea and vomiting   Complete by: As directed    Call MD for:  severe uncontrolled pain   Complete by: As directed    Call MD for:  temperature >100.4   Complete by: As directed  Diet general   Complete by: As directed    Discharge instructions   Complete by: As directed    Please be sure to follow-up with your primary care provider as scheduled.  Infectious disease doctor will arrange outpatient follow-up with their clinic.  Take your medications as prescribed.  You were cared for by a hospitalist during your hospital stay. If you have any questions about your discharge medications or the care you received while you were in the hospital after you are discharged, you can call the unit and asked to speak with the hospitalist on call if the hospitalist that took care of you is not available. Once you are discharged, your primary care physician will handle any further medical issues. Please note that NO REFILLS for any discharge medications will be authorized once you are discharged, as it is imperative that you return to your primary care physician (or establish a relationship with a primary care physician if you do not have one) for your aftercare needs so that they can reassess your need for medications and monitor your lab values. If you do not have a primary care physician, you can call [phone] for a physician referral.   Increase activity slowly   Complete by: As directed           Allergies as of 12/27/2022   No Known Allergies      Medication List     STOP taking these medications    cephALEXin 500 MG capsule Commonly known as: Keflex   doxycycline 100 MG capsule Commonly known as: VIBRAMYCIN Replaced by: doxycycline 100 MG tablet    ketoconazole 2 % cream Commonly known as: NIZORAL       TAKE these medications    cefadroxil 500 MG capsule Commonly known as: DURICEF Take 2 capsules (1,000 mg total) by mouth 2 (two) times daily for 28 days.   doxycycline 100 MG tablet Commonly known as: VIBRA-TABS Take 1 tablet (100 mg total) by mouth 2 (two) times daily for 28 days. Replaces: doxycycline 100 MG capsule   ibuprofen 600 MG tablet Commonly known as: ADVIL Take 1 tablet (600 mg total) by mouth every 6 (six) hours as needed.   oxyCODONE 5 MG immediate release tablet Commonly known as: Oxy IR/ROXICODONE Take 1 tablet (5 mg total) by mouth every 6 (six) hours as needed for severe pain.   terbinafine 1 % cream Commonly known as: LAMISIL Apply topically 2 (two) times daily.          Follow-up Information     [facility] Follow up on 01/08/2023.   Specialty: Obstetrics and Gynecology Why: 11: 10 am with Gwinda Passe NP, hospital follow up and to establish primary care. Contact information: [address] [place] [address] [phone]        [facility] for Infectious Disease Follow up.   Specialty: Infectious Diseases Contact information: [address] [place], [address] [id number] mc [place] [address] [phone]                TOTAL DISCHARGE TIME: 35 minutes  Nazariah Cadet Rito Ehrlich  [facility] Pager on [email or web address]  12/28/2022, 11:32 AM

## 2022-12-27 NOTE — Progress Notes (Signed)
Orthopedic Tech Progress Note Patient Details:  Renee Herrera [birth year] [id number]  Applied po boot, delivered crutches. Pt worked with PT on using crutches. Ortho Devices Type of Ortho Device: Postop shoe/boot, Crutches Ortho Device/Splint Location: RLE Ortho Device/Splint Interventions: Ordered, Application, Adjustment   Post Interventions Patient Tolerated: Well Instructions Provided: Care of device, Adjustment of device  Sherilyn Banker 12/27/2022, 10:29 AM

## 2022-12-28 ENCOUNTER — Encounter (HOSPITAL_COMMUNITY): Payer: Self-pay

## 2022-12-28 ENCOUNTER — Other Ambulatory Visit: Payer: Self-pay

## 2022-12-28 ENCOUNTER — Emergency Department (HOSPITAL_COMMUNITY)
Admission: EM | Admit: 2022-12-28 | Discharge: 2022-12-28 | Disposition: A | Discharge status: Home / Self Care | Payer: No Typology Code available for payment source | Attending: Emergency Medicine | Admitting: Emergency Medicine

## 2022-12-28 DIAGNOSIS — S60426A Blister (nonthermal) of right little finger, initial encounter: Secondary | ICD-10-CM | POA: Diagnosis not present

## 2022-12-28 DIAGNOSIS — X58XXXA Exposure to other specified factors, initial encounter: Secondary | ICD-10-CM | POA: Diagnosis not present

## 2022-12-28 DIAGNOSIS — L0390 Cellulitis, unspecified: Secondary | ICD-10-CM

## 2022-12-28 DIAGNOSIS — M79671 Pain in right foot: Secondary | ICD-10-CM | POA: Diagnosis present

## 2022-12-28 LAB — CBC WITH DIFFERENTIAL/PLATELET
Abs Immature Granulocytes: 0.02 10*3/uL (ref 0.00–0.07)
Basophils Absolute: 0 10*3/uL (ref 0.0–0.1)
Basophils Relative: 0 %
Eosinophils Absolute: 0 10*3/uL (ref 0.0–0.5)
Eosinophils Relative: 0 %
HCT: 40.3 % (ref 36.0–46.0)
Hemoglobin: 14.4 g/dL (ref 12.0–15.0)
Immature Granulocytes: 0 %
Lymphocytes Relative: 25 %
Lymphs Abs: 1.9 10*3/uL (ref 0.7–4.0)
MCH: 28.8 pg (ref 26.0–34.0)
MCHC: 35.7 g/dL (ref 30.0–36.0)
MCV: 80.6 fL (ref 80.0–100.0)
Monocytes Absolute: 0.5 10*3/uL (ref 0.1–1.0)
Monocytes Relative: 7 %
Neutro Abs: 5.1 10*3/uL (ref 1.7–7.7)
Neutrophils Relative %: 68 %
Platelets: 355 10*3/uL (ref 150–400)
RBC: 5 MIL/uL (ref 3.87–5.11)
RDW: 13.5 % (ref 11.5–15.5)
WBC: 7.6 10*3/uL (ref 4.0–10.5)
nRBC: 0 % (ref 0.0–0.2)

## 2022-12-28 LAB — BASIC METABOLIC PANEL
Anion gap: 9 (ref 5–15)
BUN: 11 mg/dL (ref 6–20)
CO2: 25 mmol/L (ref 22–32)
Calcium: 9.5 mg/dL (ref 8.9–10.3)
Chloride: 103 mmol/L (ref 98–111)
Creatinine, Ser: 1.01 mg/dL — ABNORMAL HIGH (ref 0.44–1.00)
GFR, Estimated: >60 mL/min (ref >60)
Glucose, Bld: 115 mg/dL — ABNORMAL HIGH (ref 70–99)
Potassium: 3.5 mmol/L (ref 3.5–5.1)
Sodium: 137 mmol/L (ref 135–145)

## 2022-12-28 LAB — SEDIMENTATION RATE: Sed Rate: 38 mm/hr — ABNORMAL HIGH (ref 0–22)

## 2022-12-28 LAB — C-REACTIVE PROTEIN: CRP: 6.2 mg/dL — ABNORMAL HIGH (ref <1.0)

## 2022-12-28 MED ORDER — ACETAMINOPHEN 500 MG PO TABS
1000.0000 mg | ORAL_TABLET | Freq: Once | ORAL | Status: AC
  Administered 2022-12-28: 1000 mg via ORAL
  Filled 2022-12-28: qty 2

## 2022-12-28 MED ORDER — KETOROLAC TROMETHAMINE 15 MG/ML IJ SOLN
15.0000 mg | Freq: Once | INTRAMUSCULAR | Status: AC
  Administered 2022-12-28: 15 mg via INTRAVENOUS
  Filled 2022-12-28: qty 1

## 2022-12-28 NOTE — ED Provider Notes (Signed)
[facility] AT [facility] Provider Note   CSN: [id number] Arrival date & time: 12/28/22  1851     History Chief Complaint  Patient presents with   Foot Pain    HPI [place] A Wadhams is a 47 y.o. female presenting for chief complaint of right foot pain. Recently admitted for osteomyelitis at [facility] MRI demonstrated fifth metatarsal osteomyelitis started on broad-spectrum antibiotics.  Cultures were performed and patient was narrowed to cefadroxil and doxycycline. Patient has had interval improvement since discharge, states she is coming here for second opinion.  Denies fevers chills nausea vomiting CP shortness of breath.  States she was unable to bear any weight on the foot because of pain.  States she is a fairly substantial throbbing sensation.  Otherwise in no acute distress..   Patient's recorded medical, surgical, social, medication list and allergies were reviewed in the Snapshot window as part of the initial history.   Review of Systems   Review of Systems  Constitutional:  Negative for chills and fever.  HENT:  Negative for ear pain and sore throat.   Eyes:  Negative for pain and visual disturbance.  Respiratory:  Negative for cough and shortness of breath.   Cardiovascular:  Negative for chest pain and palpitations.  Gastrointestinal:  Negative for abdominal pain and vomiting.  Genitourinary:  Negative for dysuria and hematuria.  Musculoskeletal:  Positive for gait problem. Negative for arthralgias and back pain.  Skin:  Negative for color change and rash.  Neurological:  Negative for seizures and syncope.  All other systems reviewed and are negative.   Physical Exam Updated Vital Signs BP 115/86 (BP Location: Right Arm)   Pulse 95   Temp 98.4 F (36.9 C) (Oral)   Resp 17   LMP 12/20/2022 (Approximate)   SpO2 99%  Physical Exam Vitals and nursing note reviewed.  Constitutional:      General: She is not in acute distress.     Appearance: She is well-developed.  HENT:     Head: Normocephalic and atraumatic.  Eyes:     Conjunctiva/sclera: Conjunctivae normal.  Cardiovascular:     Rate and Rhythm: Normal rate and regular rhythm.     Heart sounds: No murmur heard. Pulmonary:     Effort: Pulmonary effort is normal. No respiratory distress.     Breath sounds: Normal breath sounds.  Abdominal:     General: There is no distension.     Palpations: Abdomen is soft.     Tenderness: There is no abdominal tenderness. There is no right CVA tenderness or left CVA tenderness.  Musculoskeletal:        General: Tenderness and signs of injury present. No swelling. Normal range of motion.     Cervical back: Neck supple.     Comments: erythema and blistering appreciated over the anterior surface of the right lateral fourth and fifth phalanges.  Skin:    General: Skin is warm and dry.  Neurological:     General: No focal deficit present.     Mental Status: She is alert and oriented to person, place, and time. Mental status is at baseline.     Cranial Nerves: No cranial nerve deficit.      ED Course/ Medical Decision Making/ A&P    Procedures Procedures   Medications Ordered in ED Medications  acetaminophen (TYLENOL) tablet 1,000 mg (has no administration in time range)  ketorolac (TORADOL) 15 MG/ML injection 15 mg (has no administration in time range)  Medical Decision Making:    BEYLA SCHALLHORN is a 47 y.o. female who presented to the ED today with acute on chronic foot pain detailed above.      Complete initial physical exam performed, notably the patient  was hemodynamically stable in no acute distress.  Obvious cellulitis of the soft tissues with blistering of the soft tissues.      Reviewed and confirmed nursing documentation for past medical history, family history, social history.    Initial Assessment:   Patient is presenting with interval improvement of her cellulitis.  However given reported  worsening pain, evaluate for progressive disease process/translocation of disease/bacteremia. Treated with antipyretics/anti-inflammatories in the emergency department plan for reassessment. Given involvement of infectious diseases and multiple subspecialty providers, favor that patient will likely be stable for outpatient care pending these lab studies. Initial Plan:  Screening labs including CBC and Metabolic panel to evaluate for infectious or metabolic etiology of disease.  Objective evaluation as below reviewed with plan for close reassessment  Initial Study Results:   Laboratory  All laboratory results reviewed without evidence of clinically relevant pathology.    Reassessment and Plan:   Patient's history of present on his physicals and findings are not consistent with any acute pathology.  Her cellulitis appears to be improving on her antibiotics.  Medications ordered to control her pain patient feels comfortable outpatient care management.  States she is already arranged follow-up with her PCP within 72 hours.  Strict return precautions regarding interval worsening reinforced and patient expressed understanding.   Disposition:  I have considered need for hospitalization, however, considering all of the above, I believe this patient is stable for discharge at this time.  Patient/family educated about specific return precautions for given chief complaint and symptoms.  Patient/family educated about follow-up with PCP.     Patient/family expressed understanding of return precautions and need for follow-up. Patient spoken to regarding all imaging and laboratory results and appropriate follow up for these results. All education provided in verbal form with additional information in written form. Time was allowed for answering of patient questions. Patient discharged.    Emergency Department Medication Summary:   Medications  acetaminophen (TYLENOL) tablet 1,000 mg (has no administration  in time range)  ketorolac (TORADOL) 15 MG/ML injection 15 mg (has no administration in time range)         Clinical Impression:  1. Cellulitis, unspecified cellulitis site      Discharge   Final Clinical Impression(s) / ED Diagnoses Final diagnoses:  Cellulitis, unspecified cellulitis site    Rx / DC Orders ED Discharge Orders     None         Glyn Ade, MD 12/28/22 2155

## 2022-12-28 NOTE — Discharge Instructions (Signed)
You are seen today for cellulitis of your foot.  This is because of the underlying bone infection.  Continue with your antibiotics as prescribed and follow-up with the specialist as instructed.  Thank you for the opportunity to participate in your care, Glyn Ade MD

## 2022-12-28 NOTE — ED Triage Notes (Signed)
Pt has had wound on right foot for last year, but 6 days ago she noticed increased swelling and redness. Pt was diagnosed with osteomyelitis at [facility] last week

## 2022-12-30 ENCOUNTER — Ambulatory Visit: Payer: Self-pay

## 2022-12-30 NOTE — Telephone Encounter (Signed)
Chief Complaint: Blister on right foot Symptoms: blister draining, on 5th toe right foot. Frequency: Blistered formed last week. Was seen in the ED 12/28/22 Pertinent Negatives: Patient denies fever, foul odor, pain Disposition: [] ED /[] Urgent Care (no appt availability in office) / [] Appointment(In office/virtual)/ []  [facility]/ [x] Home Care/ [] Refused Recommended Disposition /[] [facility]/ [x]  Follow-up with PCP Additional Notes: Patient states she has a blister on the right foot 5th toe that has began to drain. Patient reports being seen in the ED on 12/28/22, Dx with Cellulitis in that area. Patient requested instructions on how to clean the area to ensure she is not making things worse.  Cleaning and care instructions given. Patient verbalized understanding. Also advised if symptoms get worse to call back or go back to the ED. Patient again verbalized understanding.  Summary: infected blister   Patient called stated she has cellulitis of the soft tissues with blistering of the soft tissues on right foot. She went to ER on Saturday night but the blister has now burst and she doesn't know what to do to clean it. Please f/u with patient as she says it is infection coming out.          Reason for Disposition  [1] Looks infected (spreading redness, red streak, pus) AND [2] no fever  Answer Assessment - Initial Assessment Questions 1. APPEARANCE of BLISTER: "What does it look like?"     Right foot has a blister that is draining 2. SIZE: "How large is the blister?" (inches, cm or compare to coins)     Size of quarter  3. LOCATION: "Where are the blisters located?"      Right foot, 5th toe 4. WHEN: "When did the blister happen?"     Last week 5. CAUSE: "What do you think caused the blister?"     Cellulitis 6. PAIN: "Does it hurt?" If Yes, ask: "How bad is the pain?"  (Scale 1-10; or mild, moderate, severe)     No 7. OTHER SYMPTOMS: "Do you have any other symptoms?"  (e.g., fever)     Itching, draining,  Protocols used: Blister - Foot and Hand-A-AH

## 2023-01-08 ENCOUNTER — Ambulatory Visit (INDEPENDENT_AMBULATORY_CARE_PROVIDER_SITE_OTHER): Payer: No Typology Code available for payment source | Admitting: Primary Care

## 2023-01-08 ENCOUNTER — Encounter (INDEPENDENT_AMBULATORY_CARE_PROVIDER_SITE_OTHER): Payer: Self-pay | Admitting: Primary Care

## 2023-01-08 VITALS — BP 120/77 | HR 75 | Resp 16 | Ht 66.0 in | Wt 167.2 lb

## 2023-01-08 DIAGNOSIS — Z09 Encounter for follow-up examination after completed treatment for conditions other than malignant neoplasm: Secondary | ICD-10-CM | POA: Diagnosis not present

## 2023-01-08 DIAGNOSIS — Z131 Encounter for screening for diabetes mellitus: Secondary | ICD-10-CM

## 2023-01-08 DIAGNOSIS — Z7689 Persons encountering health services in other specified circumstances: Secondary | ICD-10-CM | POA: Diagnosis not present

## 2023-01-08 DIAGNOSIS — E663 Overweight: Secondary | ICD-10-CM

## 2023-01-08 DIAGNOSIS — M79671 Pain in right foot: Secondary | ICD-10-CM | POA: Diagnosis not present

## 2023-01-08 DIAGNOSIS — K4090 Unilateral inguinal hernia, without obstruction or gangrene, not specified as recurrent: Secondary | ICD-10-CM

## 2023-01-08 LAB — POCT GLYCOSYLATED HEMOGLOBIN (HGB A1C): HbA1c POC (<> result, manual entry): 5.6 % (ref 4.0–5.6)

## 2023-01-08 NOTE — Progress Notes (Signed)
[facility]   Subjective:   [name] is a 47 y.o. female presents for hospital follow up and establish care. Presented to the ED after a hospitalization for osteomyelitis on  12/28/22,  right foot pain. She is currently tasking her abt's without problems. She has been constipated while taking them. Sometimes her foot feels like she is walking on glass. Patient has No headache, No chest pain, No abdominal pain - No Nausea, No new weakness tingling or numbness, No Cough - shortness of breath  Past Medical History:  Diagnosis Date   Anxiety    Depression    Fibroid    Hernia, umbilical      No Known Allergies    Current Outpatient Medications on File Prior to Visit  Medication Sig Dispense Refill   cefadroxil (DURICEF) 500 MG capsule Take 2 capsules (1,000 mg total) by mouth 2 (two) times daily for 28 days. 112 capsule 0   doxycycline (VIBRA-TABS) 100 MG tablet Take 1 tablet (100 mg total) by mouth 2 (two) times daily for 28 days. 56 tablet 0   ibuprofen (ADVIL) 600 MG tablet Take 1 tablet (600 mg total) by mouth every 6 (six) hours as needed. 30 tablet 0   oxyCODONE (OXY IR/ROXICODONE) 5 MG immediate release tablet Take 1 tablet (5 mg total) by mouth every 6 (six) hours as needed for severe pain. 30 tablet 0   terbinafine (LAMISIL) 1 % cream Apply topically 2 (two) times daily. 30 g 0   No current facility-administered medications on file prior to visit.     Review of System: Comprehensive ROS Pertinent positive and negative noted in HPI    Objective:  Blood Pressure 120/77   Pulse 75   Respiration 16   Height 5\' 6"  (1.676 m)   Weight 167 lb 3.2 oz (75.8 kg)   Last Menstrual Period 12/20/2022 (Approximate)   Oxygen Saturation 98%   Body Mass Index 26.99 kg/m   Filed Weights   01/08/23 1147  Weight: 167 lb 3.2 oz (75.8 kg)   Physical Exam: General Appearance: Well nourished, in no apparent distress. Eyes: PERRLA, EOMs, conjunctiva no swelling or  erythema Sinuses: No Frontal/maxillary tenderness ENT/Mouth: Ext aud canals clear, TMs without erythema, bulging. No erythema, swelling, or exudate on post pharynx.  Tonsils not swollen or erythematous. Hearing normal.  Neck: Supple, thyroid normal.  Respiratory: Respiratory effort normal, BS equal bilaterally without rales, rhonchi, wheezing or stridor.  Cardio: RRR with no MRGs. Brisk peripheral pulses without edema.  Abdomen: umbilical hernias,  Lymphatics: Non tender without lymphadenopathy.  Musculoskeletal: Full ROM, 5/5 strength, normal gait.  Skin: Warm, dry without rashes, lesions, ecchymosis.  Neuro: Cranial nerves intact. Normal muscle tone, no cerebellar symptoms. Sensation intact.  Psych: Awake and oriented X 3, normal affect, Insight and Judgment appropriate.    Assessment:  Camara was seen today for hospitalization follow-up.  Diagnoses and all orders for this visit:  Hospital discharge follow-up 2/2 Right foot pain Cont abt's send picture to my chart to place in this note   Encounter to establish care  Overweight (BMI 25.0-29.9) Discussed diet and exercise for person with BMI >25. Instructed: You must burn more calories than you eat. Losing 5 percent of your body weight should be considered a success. In the longer term, losing more than 15 percent of your body weight and staying at this weight is an extremely good result. However, keep in mind that even losing 5 percent of your body weight  leads to important health benefits, so try not to get discouraged if you're not able to lose more than this. Will recheck weight in 3-6 months.   Diabetes screening  PCOT 5.7   Non-recurrent unilateral inguinal hernia without obstruction or gangrene  Refer to surgery after [facility] financial aid packet completed    This note has been created with Education officer, environmental. Any transcriptional errors are unintentional.   Grayce Sessions,  NP 01/08/2023, 11:52 AM

## 2023-01-16 ENCOUNTER — Other Ambulatory Visit: Payer: Self-pay

## 2023-01-16 ENCOUNTER — Ambulatory Visit (INDEPENDENT_AMBULATORY_CARE_PROVIDER_SITE_OTHER): Payer: No Typology Code available for payment source | Admitting: Infectious Diseases

## 2023-01-16 ENCOUNTER — Encounter: Payer: Self-pay | Admitting: Infectious Diseases

## 2023-01-16 VITALS — BP 121/76 | HR 99 | Resp 16 | Ht 66.0 in | Wt 164.0 lb

## 2023-01-16 DIAGNOSIS — Z79899 Other long term (current) drug therapy: Secondary | ICD-10-CM | POA: Diagnosis not present

## 2023-01-16 DIAGNOSIS — M869 Osteomyelitis, unspecified: Secondary | ICD-10-CM

## 2023-01-16 DIAGNOSIS — M86171 Other acute osteomyelitis, right ankle and foot: Secondary | ICD-10-CM

## 2023-01-16 DIAGNOSIS — F17200 Nicotine dependence, unspecified, uncomplicated: Secondary | ICD-10-CM

## 2023-01-16 DIAGNOSIS — F17210 Nicotine dependence, cigarettes, uncomplicated: Secondary | ICD-10-CM

## 2023-01-16 LAB — CBC
HCT: 41.4 % (ref 35.0–45.0)
Hemoglobin: 14 g/dL (ref 11.7–15.5)
MCH: 28.8 pg (ref 27.0–33.0)
MCHC: 33.8 g/dL (ref 32.0–36.0)
MCV: 85.2 fL (ref 80.0–100.0)
MPV: 10.4 fL (ref 7.5–12.5)
Platelets: 380 10*3/uL (ref 140–400)
RBC: 4.86 10*6/uL (ref 3.80–5.10)
RDW: 14.5 % (ref 11.0–15.0)
WBC: 7.7 10*3/uL (ref 3.8–10.8)

## 2023-01-16 MED ORDER — DOXYCYCLINE HYCLATE 100 MG PO TABS: 100.0000 mg | ORAL_TABLET | Freq: Two times a day (BID) | ORAL | 0 refills | Status: AC

## 2023-01-16 MED ORDER — CEFADROXIL 500 MG PO CAPS: 1000.0000 mg | ORAL_CAPSULE | Freq: Two times a day (BID) | ORAL | 0 refills | Status: DC

## 2023-01-16 MED ORDER — DOXYCYCLINE HYCLATE 100 MG PO TABS: 100.0000 mg | ORAL_TABLET | Freq: Two times a day (BID) | ORAL | 0 refills | Status: DC

## 2023-01-16 MED ORDER — CEFADROXIL 500 MG PO CAPS: 1000.0000 mg | ORAL_CAPSULE | Freq: Two times a day (BID) | ORAL | 0 refills | Status: AC

## 2023-01-16 NOTE — Progress Notes (Unsigned)
Patient Active Problem List   Diagnosis Date Noted   Osteomyelitis (HCC) 12/22/2022   Patient's Medications  New Prescriptions   No medications on file  Previous Medications   CEFADROXIL (DURICEF) 500 MG CAPSULE    Take 2 capsules (1,000 mg total) by mouth 2 (two) times daily for 28 days.   DOXYCYCLINE (VIBRA-TABS) 100 MG TABLET    Take 1 tablet (100 mg total) by mouth 2 (two) times daily for 28 days.   IBUPROFEN (ADVIL) 600 MG TABLET    Take 1 tablet (600 mg total) by mouth every 6 (six) hours as needed.   OXYCODONE (OXY IR/ROXICODONE) 5 MG IMMEDIATE RELEASE TABLET    Take 1 tablet (5 mg total) by mouth every 6 (six) hours as needed for severe pain.   TERBINAFINE (LAMISIL) 1 % CREAM    Apply topically 2 (two) times daily.  Modified Medications   No medications on file  Discontinued Medications   No medications on file    Subjective: 47 year old female with history of anxiety/depression, prediabetes who is here for hospital follow-up for osteomyelitis of right fifth toe. she was seen by ID and orthopedics in the hospital.  MRI was concerning for acute osteomyelitis of proximal middle phalanx and possible distal phalanx of the fifth toe. Orthopedics recommended conservative management with antibiotics.  Patient was unable to afford for IV antibiotics and hence plan for doxycycline and cefadroxil for discharge for a month and outpatient follow-up.  Seen at the ED 7/6 for foot pain and analgesics given. Seen by PCP 7/17  7/27  Reports seeing Podiatry PA last week, no reported concerns. She showed me callus on bottom of rt feet and told she had pins in her left 2nd and third toe. She has been taking antibiotics as instructed, missed only one dose. Denies fevers, chills. Denies nausea, vomiting and diarrhea. Rt foot wound healing with no erythema, tenderness, swelling of drainage.   Review of Systems: all systems reviewed with pertinent positive and negative as listed above  Past  Medical History:  Diagnosis Date   Anxiety    Depression    Fibroid    Hernia, umbilical    Past Surgical History:  Procedure Laterality Date   CESAREAN SECTION     TUBAL LIGATION      Social History   Tobacco Use   Smoking status: Every Day    Current packs/day: 0.50    Types: Cigarettes   Smokeless tobacco: Never  Vaping Use   Vaping status: Never Used  Substance Use Topics   Alcohol use: Yes    Comment: on weekends    Drug use: Yes    Types: Marijuana    Comment: "X pill" on Saturday 09/25/19    Family History  Problem Relation Age of Onset   Mental retardation Father    Cancer Other    Hypertension Other     No Known Allergies  Health Maintenance  Topic Date Due   Hepatitis C Screening  Never done   DTaP/Tdap/Td (1 - Tdap) Never done   PAP SMEAR-Modifier  Never done   Colonoscopy  Never done   COVID-19 Vaccine (1 - 2023-24 season) Never done   INFLUENZA VACCINE  01/23/2023   HIV Screening  Completed   HPV VACCINES  Aged Out    Objective: BP 121/76   Pulse 99   Resp 16   Ht 5\' 6"  (1.676 m)   Wt 164 lb (74.4 kg)   LMP 12/20/2022 (  Approximate)   SpO2 100%   BMI 26.47 kg/m    Physical Exam Constitutional:      Appearance: Normal appearance.  HENT:     Head: Normocephalic and atraumatic.      Mouth: Mucous membranes are moist.  Eyes:    Conjunctiva/sclera: Conjunctivae normal.     Pupils: Pupils are equal, round, and reactive to light.   Cardiovascular:     Rate and Rhythm: Normal rate and regular rhythm.     Heart sounds: No murmur heard. No friction rub. No gallop.   Pulmonary:     Effort: Pulmonary effort is normal.     Breath sounds: Normal breath sounds.   Abdominal:     General: Non distended     Palpations: soft.   Musculoskeletal:        General: Normal range of motion.  Wound between right fourth and fifth toe seems to be healing with no purulence, swelling or erythema or tenderness.  She has calluses in her plantar surface  of foot.       Skin:    General: Skin is warm and dry.     Comments:  Neurological:     General: grossly non focal     Mental Status: awake, alert and oriented to person, place, and time.   Psychiatric:        Mood and Affect: Mood normal.   Lab Results Lab Results  Component Value Date   WBC 7.6 12/28/2022   HGB 14.4 12/28/2022   HCT 40.3 12/28/2022   MCV 80.6 12/28/2022   PLT 355 12/28/2022    Lab Results  Component Value Date   CREATININE 1.01 (H) 12/28/2022   BUN 11 12/28/2022   NA 137 12/28/2022   K 3.5 12/28/2022   CL 103 12/28/2022   CO2 25 12/28/2022    Lab Results  Component Value Date   ALT 10 12/22/2022   AST 14 (L) 12/22/2022   ALKPHOS 65 12/22/2022   BILITOT 0.5 12/22/2022    No results found for: "CHOL", "HDL", "LDLCALC", "LDLDIRECT", "TRIG", "CHOLHDL" Lab Results  Component Value Date   LABRPR Non Reactive 10/28/2016   No results found for: "HIV1RNAQUANT", "HIV1RNAVL", "CD4TABS"   Microbiology Results for orders placed or performed during the hospital encounter of 12/22/22  Blood Cultures x 2 sites     Status: None   Collection Time: 12/22/22  9:24 PM   Specimen: BLOOD  Result Value Ref Range Status   Specimen Description BLOOD LEFT ANTECUBITAL  Final   Special Requests   Final    BOTTLES DRAWN AEROBIC AND ANAEROBIC Blood Culture results may not be optimal due to an inadequate volume of blood received in culture bottles   Culture   Final    NO GROWTH 5 DAYS Performed at [facility], [address], [place], [place] [address]    Report Status 12/27/2022 FINAL  Final  Blood Cultures x 2 sites     Status: None   Collection Time: 12/22/22  9:29 PM   Specimen: BLOOD  Result Value Ref Range Status   Specimen Description BLOOD BLOOD RIGHT FOREARM  Final   Special Requests   Final    BOTTLES DRAWN AEROBIC AND ANAEROBIC Blood Culture adequate volume   Culture   Final    NO GROWTH 5 DAYS Performed at [facility], [address] [place], [place] [address]    Report Status 12/27/2022 FINAL  Final   Imaging MR FOOT RIGHT  WO CONTRAST  Result Date: 12/23/2022 CLINICAL DATA:  Several weeks of right foot pain. Wound between fourth and fifth toes. Initially presented to the ED 10/2022 horizontal foot fungal infection. For the past 24 hours dorsal right foot is swollen, erythematous, tender, and warm to the touch. Urgent care x-ray revealed findings concerning for possible osteomyelitis of the fifth toe. EXAM: MRI OF THE RIGHT FOREFOOT WITHOUT CONTRAST TECHNIQUE: Multiplanar, multisequence MR imaging of the right forefoot was performed. No intravenous contrast was administered. COMPARISON:  Right foot radiographs 12/22/2022, 10/31/2022 FINDINGS: Bones/Joint/Cartilage On yesterday's right foot radiographs there was a minimal 1.5 mm possible lucency at the distal medial aspect of the proximal phalanx of fifth toe that may be new from 10/31/2022. On the current MRI, there is moderate fifth toe soft tissue swelling and edema. There is high-grade increased T2 signal throughout the shaft through the distal aspect of the proximal phalanx and throughout the middle phalanx and likely mildly within the distal phalanx of the fifth toe on axial T2 weighted images (axial series 5 images 26 through 33). This marrow edema appears to be confirmed on sagittal STIR series 4 images 4 and 5 and coronal STIR series 6 images 12 and 13). There may be mild cortical erosion within portions of the lateral aspect of the middle phalanx (axial series 2, image 31) and lateral aspect of the distal phalanx (axial series 2, image 33 and 34), suspicious for acute osteomyelitis. There appears to be edema at the interface of the bone cortex and the surrounding musculature of the majority of the fourth metatarsal shaft (axial series 5 images 12 through 22 and coronal series 6, image 12), however no cortical defect or marrow edema is seen. Trace edema is also seen medial to  the medial cortex of the mid fifth metatarsal shaft (axial series 5, image 13). Moderate hallux valgus.  No acute fracture is seen. Ligaments The Lisfranc ligament complex is intact. The medial and lateral metatarsophalangeal and interphalangeal ligaments appear intact. Muscles and Tendons Mild edema within the lumbar fall muscles around the fourth metatarsal shaft. No tendon tear is identified. Soft tissues There is moderate dorsal lateral greater than medial infection forefoot soft tissue swelling extending into the subcutaneous fat of the fifth toe greater than the fourth toe, and the subcutaneous fat plantar to the third through fifth toe proximal aspect of the proximal phalanges. IMPRESSION: 1. Moderate dorsal lateral greater than medial forefoot soft tissue swelling and edema extending into the subcutaneous fat of the fifth toe greater than the fourth toe, and the subcutaneous fat plantar to the third through fifth toe proximal phalanges. 2. Findings suspicious for acute osteomyelitis of a proximal middle phalanges and possible distal phalanx of the fifth toe. 3. There appears to be edema at the interface of the bone cortex and the surrounding musculature of the majority of the fourth metatarsal shaft, however no cortical defect or marrow edema is seen to indicate acute osteomyelitis. 4. Moderate hallux valgus. Electronically Signed   By: Neita Garnet M.D.   On: 12/23/2022 11:40   DG Chest 2 View  Result Date: 12/22/2022 CLINICAL DATA:  Edema/infection EXAM: CHEST - 2 VIEW COMPARISON:  07/22/2018 FINDINGS: Lungs are clear.  No pleural effusion or pneumothorax. The heart is normal in size. Visualized osseous structures are within normal limits. IMPRESSION: Normal chest radiographs. Electronically Signed   By: Charline Bills M.D.   On: 12/22/2022 20:48   DG Foot Complete Right  Result Date: 12/22/2022 CLINICAL DATA:  Pain between the fourth and fifth toes for 3 days. Diffuse dorsal swelling. Evaluate  for osteomyelitis. I for metatarsophalangeal fracture of the fifth toe. No known injury. EXAM: RIGHT FOOT COMPLETE - 3+ VIEW COMPARISON:  Right foot radiographs 10/31/2022 FINDINGS: Moderate fifth and mild-to-moderate fourth toe soft tissue swelling appears similar to 10/31/2022. On oblique view there is a 1.5 mm lucency at the distal medial aspect of the proximal phalanx of the fifth toe that appears new from 10/31/2022, although slight differences in projection may be why this is not seen on the prior study. A new cortical erosion in the setting of fifth toe soft tissue swelling is possible. No subcutaneous air. Moderate hallux valgus is unchanged. No acute fracture or dislocation. IMPRESSION: 1. Moderate fifth and mild-to-moderate fourth toe soft tissue swelling appears similar to 10/31/2022. 2. New 1.5 mm lucency at the distal medial aspect of the proximal phalanx of the fifth toe may represent a new cortical erosion. Given the fifth toe soft tissue swelling, recommend clinical correlation for possible posttraumatic change versus possible very early osteomyelitis. 3. Moderate hallux valgus. Electronically Signed   By: Neita Garnet M.D.   On: 12/22/2022 18:57    Assessment/plan # Right fifth toe osteomyelitis # Wound between rt 4th and 5th toe # Medication management  Plan Continue doxycycline and cefadroxil as is Continue antifungal cream as prescribed  Labs today Antibiotic refill for 2 more weeks to complete 6 weeks course Follow-up with [place] in 2 weeks and podiatry as instructed  # HBV/HCV screen Willing to check  # Smoker Will benefit from cutting down or quitting  I have personally spent at least 60 minutes involved in face-to-face and non-face-to-face activities for this patient on the day of the visit. Professional time spent includes the following activities: Preparing to see the patient (review of tests), Obtaining and/or reviewing separately obtained history (admission/discharge  record), Performing a medically appropriate examination and/or evaluation , Ordering medications/tests/procedures, referring and communicating with other health care professionals, Documenting clinical information in the EMR, Independently interpreting results (not separately reported), Communicating results to the patient/family/caregiver, Counseling and educating the patient/family/caregiver and Care coordination (not separately reported).   Victoriano Lain, MD [facility] 01/16/2023, 12:15 PM

## 2023-01-17 DIAGNOSIS — F17200 Nicotine dependence, unspecified, uncomplicated: Secondary | ICD-10-CM | POA: Insufficient documentation

## 2023-01-20 ENCOUNTER — Telehealth: Payer: Self-pay

## 2023-01-20 NOTE — Telephone Encounter (Signed)
Patient informed of lab results and verbalized understanding.  Renee Herrera  

## 2023-01-20 NOTE — Telephone Encounter (Signed)
-----   Message from Renee Herrera sent at 01/20/2023  7:07 AM EDT ----- Please let patient know lab work is negative for any acute abnormality including normal inflammatory markers which is a good sign. Negative for Hepatitis B and C.

## 2023-01-23 ENCOUNTER — Encounter: Payer: Self-pay | Admitting: Podiatry

## 2023-01-23 ENCOUNTER — Ambulatory Visit (INDEPENDENT_AMBULATORY_CARE_PROVIDER_SITE_OTHER): Payer: Medicaid Other | Admitting: Podiatry

## 2023-01-23 DIAGNOSIS — M869 Osteomyelitis, unspecified: Secondary | ICD-10-CM | POA: Diagnosis not present

## 2023-01-23 DIAGNOSIS — L97511 Non-pressure chronic ulcer of other part of right foot limited to breakdown of skin: Secondary | ICD-10-CM | POA: Diagnosis not present

## 2023-01-23 MED ORDER — UREA 10 % EX CREA: TOPICAL_CREAM | CUTANEOUS | 0 refills | Status: AC | PRN

## 2023-01-23 NOTE — Progress Notes (Signed)
Subjective:  Patient ID: Renee Herrera, female    DOB: Mar 22, 1976,  MRN: [id number]  Chief Complaint  Patient presents with   Tinea Pedis    Infected severe tinea pedis right foot. Patient is using cream to foot.    Callouses    Callus trim bilateral feet. Right foot is worst than left.    Foot Problem    Cellulitis of right foot. Patient was taking antibiotics. Doctor from the hospital told patient that if the antibiotics did not work they will have to amputate her foot.    Ingrown Toenail    Possible ingrown to 2nd toe on left foot.     47 y.o. female presents with concern for calluses on both feet.  Has severe pain with them.  Also here for concern for wound in the right fourth webspace.  Patient was admitted previously in the hospital also for infection in the right foot.  Had an MRI done that showed possible bone infection in the fifth toe.  She has been taking long-term course of oral antibiotics that she was dispensed by the hospital upon discharge.  While in the hospital she was seen by the orthopedic PA after the finding of osteomyelitis and decision was made to pursue nonoperative management with antibiotic therapy.  And was told to follow-up with Dr. Lajoyce Corners if there was any ongoing concern regarding the wound on her right foot or bone infection.  Past Medical History:  Diagnosis Date   Anxiety    Depression    Fibroid    Hernia, umbilical     No Known Allergies  ROS: Negative except as per HPI above  Objective:  General: AAO x3, NAD  Dermatological: Superficial ulceration present in the right fourth webspace.  Mild maceration is location there is no deep probing ulceration at this time.  No significant erythema or edema.  At the plantar aspect of the foot bilaterally there is noted to be hyperkeratotic lesions x 3 - 2 on the right foot and 1 on the left with central hyperkeratotic core consistent with porokeratosis.  Patient will not allow debridement of these this they are  too painful to touch.     Vascular:  Dorsalis Pedis artery and Posterior Tibial artery pedal pulses are 2/4 bilateral.  Capillary fill time < 3 sec to all digits.   Neruologic: Grossly diminished via light touch bilateral  Musculoskeletal: Mild edema of the fifth toe of the right foot.  Severe pain on palpation of porokeratosis lesions on bilateral foot  Gait: Unassisted, Nonantalgic.   No images are attached to the encounter.  Radiographs: Deferred at this visit Assessment:   1. Osteomyelitis of right foot, unspecified type (HCC)   2. Ulcer of right foot, limited to breakdown of skin [facility])      Plan:  Patient was evaluated and treated and all questions answered.  # Webspace maceration and superficial ulceration limited to breakdown of skin in the right fourth webspace -Discussed with patient that it appears the oral antibiotics are currently treating her prior infection effectively however cannot rule out underlying bone infection which may flare back up when the antibiotics and -For now recommend daily Betadine gauze applied in between the fourth and fifth toe on the right foot to decrease maceration and prevent further breakdown and tissue loss -Continue taking antibiotics including cefadroxil and doxycycline until they are completely finished -If further issues with this toe or wound recommend follow-up with Dr. Lajoyce Corners for further evaluation as the orthopedics team  did see the patient in the hospital for this issue -If there is failure of antibiotics patient will likely need amputation of the fifth toe and possibly a portion of the fifth metatarsal head depending on severity of infection however it appears the infection is responding to oral antibiotics at this time  # Porokeratosis bilateral foot -Attempted to debride porokeratosis bilateral foot however patient would not allow for debridement given severe pain associate with any pressure on the area -Recommend applying urea  cream to the lesions once daily to try and chemically debride them         Corinna Gab, DPM [facility] / [facility]

## 2023-02-06 ENCOUNTER — Ambulatory Visit (INDEPENDENT_AMBULATORY_CARE_PROVIDER_SITE_OTHER): Payer: No Typology Code available for payment source | Admitting: Infectious Diseases

## 2023-02-06 ENCOUNTER — Other Ambulatory Visit: Payer: Self-pay

## 2023-02-06 ENCOUNTER — Encounter: Payer: Self-pay | Admitting: Infectious Diseases

## 2023-02-06 ENCOUNTER — Other Ambulatory Visit: Payer: Self-pay | Admitting: Infectious Diseases

## 2023-02-06 VITALS — BP 109/73 | HR 84 | Resp 16 | Ht 66.0 in | Wt 170.0 lb

## 2023-02-06 DIAGNOSIS — Z79899 Other long term (current) drug therapy: Secondary | ICD-10-CM

## 2023-02-06 DIAGNOSIS — M869 Osteomyelitis, unspecified: Secondary | ICD-10-CM

## 2023-02-06 NOTE — Progress Notes (Signed)
Patient Active Problem List   Diagnosis Date Noted   Smoker 01/17/2023   Medication management 01/16/2023   Osteomyelitis (HCC) 12/22/2022   Patient's Medications  New Prescriptions   No medications on file  Previous Medications   CEFADROXIL (DURICEF) 500 MG CAPSULE    Take 2 capsules (1,000 mg total) by mouth 2 (two) times daily for 28 days.   DOXYCYCLINE (VIBRA-TABS) 100 MG TABLET    Take 1 tablet (100 mg total) by mouth 2 (two) times daily for 28 days.   IBUPROFEN (ADVIL) 600 MG TABLET    Take 1 tablet (600 mg total) by mouth every 6 (six) hours as needed.   OXYCODONE (OXY IR/ROXICODONE) 5 MG IMMEDIATE RELEASE TABLET    Take 1 tablet (5 mg total) by mouth every 6 (six) hours as needed for severe pain.   TERBINAFINE (LAMISIL) 1 % CREAM    Apply topically 2 (two) times daily.   UREA (CARMOL) 10 % CREAM    Apply topically as needed.  Modified Medications   No medications on file  Discontinued Medications   No medications on file    Subjective: 47 year old female with history of anxiety/depression, prediabetes who is here for hospital follow-up for osteomyelitis of right fifth toe. she was seen by ID and orthopedics in the hospital.  MRI was concerning for acute osteomyelitis of proximal middle phalanx and possible distal phalanx of the fifth toe. Orthopedics recommended conservative management with antibiotics.  Patient was unable to afford for IV antibiotics and hence plan for doxycycline and cefadroxil for discharge for a month and outpatient follow-up.  Seen at the ED 7/6 for foot pain and analgesics given. Seen by PCP 7/17  7/27  Reports seeing Podiatry PA last week, no reported concerns. She showed me callus on bottom of rt feet and told she had pins in her left 2nd and third toe. She has been taking antibiotics as instructed, missed only one dose. Denies fevers, chills. Denies nausea, vomiting and diarrhea. Rt foot wound healing with no erythema, tenderness, swelling of  drainage.   8/15 Taking PO abtx as instructed without missing doses. Denies any pain, tenderness, swelling at the rt 4th and 5th web space and seems to be healing. Seen by Podiatry 8/1 and recommended to continue abtx. She has no other complaints.   Review of Systems: all systems reviewed with pertinent positive and negative as listed above  Past Medical History:  Diagnosis Date   Anxiety    Depression    Fibroid    Hernia, umbilical    Past Surgical History:  Procedure Laterality Date   CESAREAN SECTION     TUBAL LIGATION      Social History   Tobacco Use   Smoking status: Every Day    Current packs/day: 0.50    Types: Cigarettes   Smokeless tobacco: Never  Vaping Use   Vaping status: Never Used  Substance Use Topics   Alcohol use: Yes    Comment: on weekends    Drug use: Yes    Types: Marijuana    Comment: "X pill" on Saturday 09/25/19    Family History  Problem Relation Age of Onset   Mental retardation Father    Cancer Other    Hypertension Other     No Known Allergies  Health Maintenance  Topic Date Due   COVID-19 Vaccine (1) Never done   DTaP/Tdap/Td (1 - Tdap) Never done   PAP SMEAR-Modifier  Never done  Colonoscopy  Never done   INFLUENZA VACCINE  01/23/2023   Hepatitis C Screening  Completed   HIV Screening  Completed   HPV VACCINES  Aged Out    Objective: BP 109/73   Pulse 84   Resp 16   Ht 5\' 6"  (1.676 m)   Wt 170 lb (77.1 kg)   LMP  (LMP Unknown)   SpO2 98%   BMI 27.44 kg/m   Physical Exam Constitutional:      Appearance: Normal appearance.  HENT:     Head: Normocephalic and atraumatic.      Mouth: Mucous membranes are moist.  Eyes:    Conjunctiva/sclera: Conjunctivae normal.     Pupils: Pupils are equal, round, and bilaterally symmetrical   Cardiovascular:     Rate and Rhythm: Normal rate and regular rhythm.     Heart sounds:   Pulmonary:     Effort: Pulmonary effort is normal.     Breath sounds:  Abdominal:      General: Non distended     Palpations:    Musculoskeletal:        General: Normal range of motion.  Wound between right fourth and fifth toe seems to have healed  Skin:    General: Skin is warm and dry.     Comments:  Neurological:     General: grossly non focal     Mental Status: awake, alert and oriented to person, place, and time.   Psychiatric:        Mood and Affect: Mood normal.   Lab Results Lab Results  Component Value Date   WBC 7.7 01/16/2023   HGB 14.0 01/16/2023   HCT 41.4 01/16/2023   MCV 85.2 01/16/2023   PLT 380 01/16/2023    Lab Results  Component Value Date   CREATININE 0.94 01/16/2023   BUN 15 01/16/2023   NA 136 01/16/2023   K 3.9 01/16/2023   CL 106 01/16/2023   CO2 25 01/16/2023    Lab Results  Component Value Date   ALT 10 12/22/2022   AST 14 (L) 12/22/2022   ALKPHOS 65 12/22/2022   BILITOT 0.5 12/22/2022    No results found for: "CHOL", "HDL", "LDLCALC", "LDLDIRECT", "TRIG", "CHOLHDL" Lab Results  Component Value Date   LABRPR Non Reactive 10/28/2016   No results found for: "HIV1RNAQUANT", "HIV1RNAVL", "CD4TABS"   Microbiology Results for orders placed or performed during the hospital encounter of 12/22/22  Blood Cultures x 2 sites     Status: None   Collection Time: 12/22/22  9:24 PM   Specimen: BLOOD  Result Value Ref Range Status   Specimen Description BLOOD LEFT ANTECUBITAL  Final   Special Requests   Final    BOTTLES DRAWN AEROBIC AND ANAEROBIC Blood Culture results may not be optimal due to an inadequate volume of blood received in culture bottles   Culture   Final    NO GROWTH 5 DAYS Performed at [facility], [address] [place], [place] [address]    Report Status 12/27/2022 FINAL  Final  Blood Cultures x 2 sites     Status: None   Collection Time: 12/22/22  9:29 PM   Specimen: BLOOD  Result Value Ref Range Status   Specimen Description BLOOD BLOOD RIGHT FOREARM  Final   Special Requests   Final    BOTTLES  DRAWN AEROBIC AND ANAEROBIC Blood Culture adequate volume   Culture   Final    NO GROWTH 5 DAYS Performed at [facility], [address] [place], [place] [address]    Report Status 12/27/2022 FINAL  Final   Imaging No results found.  Assessment/plan # Right fifth toe osteomyelitis # Wound between rt 4th and 5th toe # Medication management  Plan Complete 6 weeks course of doxycycline and cefadroxil as prescribed  Labs today  Fu as needed  Discussed warning signs and symptoms to seek immediate attention  # HBV/HCV screen Discussed being negative for active HBV and HCV infection  I have personally spent 31 minutes involved in face-to-face and non-face-to-face activities for this patient on the day of the visit. Professional time spent includes the following activities: Preparing to see the patient (review of tests), Obtaining and/or reviewing separately obtained history (admission/discharge record), Performing a medically appropriate examination and/or evaluation , Ordering medications/tests/procedures, referring and communicating with other health care professionals, Documenting clinical information in the EMR, Independently interpreting results (not separately reported), Communicating results to the patient/family/caregiver, Counseling and educating the patient/family/caregiver and Care coordination (not separately reported).   Victoriano Lain, MD [facility] 02/06/2023, 2:23 PM

## 2023-02-07 LAB — BASIC METABOLIC PANEL
BUN: 12 mg/dL (ref 7–25)
CO2: 25 mmol/L (ref 20–32)
Calcium: 8.8 mg/dL (ref 8.6–10.2)
Chloride: 105 mmol/L (ref 98–110)
Creat: 0.76 mg/dL (ref 0.50–0.99)
Glucose, Bld: 77 mg/dL (ref 65–99)
Potassium: 4 mmol/L (ref 3.5–5.3)
Sodium: 137 mmol/L (ref 135–146)

## 2023-02-07 LAB — CBC
HCT: 41 % (ref 35.0–45.0)
Hemoglobin: 13.7 g/dL (ref 11.7–15.5)
MCH: 29.1 pg (ref 27.0–33.0)
MCHC: 33.4 g/dL (ref 32.0–36.0)
MCV: 87.2 fL (ref 80.0–100.0)
MPV: 10.3 fL (ref 7.5–12.5)
Platelets: 349 10*3/uL (ref 140–400)
RBC: 4.7 10*6/uL (ref 3.80–5.10)
RDW: 14.5 % (ref 11.0–15.0)
WBC: 7.2 10*3/uL (ref 3.8–10.8)

## 2023-02-10 ENCOUNTER — Telehealth: Payer: Self-pay

## 2023-02-10 NOTE — Telephone Encounter (Signed)
Spoke with patient and relayed results. No questions at this time.  Renee Herrera, RMA

## 2023-02-10 NOTE — Telephone Encounter (Signed)
-----   Message from Victoriano Lain sent at 02/10/2023  8:34 AM EDT ----- Please let patient know lab work is normal. Complete course of abtx as discussed in office visit. No new recommendations.

## 2023-08-28 ENCOUNTER — Other Ambulatory Visit: Payer: Self-pay

## 2023-08-28 ENCOUNTER — Ambulatory Visit: Admitting: Orthopedic Surgery

## 2023-08-28 DIAGNOSIS — L97511 Non-pressure chronic ulcer of other part of right foot limited to breakdown of skin: Secondary | ICD-10-CM

## 2023-08-28 DIAGNOSIS — M79671 Pain in right foot: Secondary | ICD-10-CM | POA: Diagnosis not present

## 2023-08-29 ENCOUNTER — Encounter: Payer: Self-pay | Admitting: Orthopedic Surgery

## 2023-08-29 NOTE — Progress Notes (Signed)
 Office Visit Note   Patient: Renee Herrera           Date of Birth: [birth year]           MRN: [id number] Visit Date: 08/28/2023              Requested by: No referring provider defined for this encounter. PCP: Pcp, No  Chief Complaint  Patient presents with   Right Foot - Pain      HPI: Patient is a 48 year old woman who is seen for initial evaluation for previous infection of the right little toe.  Patient states that this has been infected since June of last year.  Infections originally started as a blister.  The blister has resolved and patient has a persistent fungal rash in the fourth webspace.  Assessment & Plan: Visit Diagnoses:  1. Pain in right foot   2. Skin ulcer of toe of right foot, limited to breakdown of skin (HCC)     Plan: Recommended Dial soap cleansing and packing the webspace opening with a Vive wear piece of sock.  This should help resolve the fungal dermatitis.  There is no sausage digit swelling of the toe there is no open ulcers there is no cellulitis will be able to continue following this conservatively.  Follow-Up Instructions: Return if symptoms worsen or fail to improve.   Ortho Exam  Patient is alert, oriented, no adenopathy, well-dressed, normal affect, normal respiratory effort. Examination patient has a good palpable dorsalis pedis pulse she has good range of motion of the ankle and subtalar joint.  She is currently ambulating in sandals.  Examination little toe there is no swelling there is no ulcers there is no cellulitis.  There is a small fungal rash in the fourth webspace but no open wound.  Imaging: No results found. No images are attached to the encounter.  Labs: Lab Results  Component Value Date   HGBA1C 5.6 01/08/2023   ESRSEDRATE 14 01/16/2023   ESRSEDRATE 38 (H) 12/28/2022   ESRSEDRATE 12 12/22/2022   CRP <3.0 01/16/2023   CRP 6.2 (H) 12/28/2022   CRP 1.9 (H) 12/22/2022   REPTSTATUS 12/27/2022 FINAL 12/22/2022   CULT   12/22/2022    NO GROWTH 5 DAYS Performed at [facility], [address] [place], [place] [address]      Lab Results  Component Value Date   ALBUMIN 3.7 12/22/2022   ALBUMIN 4.0 06/14/2021   ALBUMIN 3.9 12/23/2019    Lab Results  Component Value Date   MG 1.9 12/26/2022   MG 1.9 12/23/2022   No results found for: "VD25OH"  No results found for: "PREALBUMIN"    Latest Ref Rng & Units 02/06/2023    3:15 PM 01/16/2023    3:59 PM 12/28/2022    7:25 PM  CBC EXTENDED  WBC 3.8 - 10.8 Thousand/uL 7.2  7.7  7.6   RBC 3.80 - 5.10 Million/uL 4.70  4.86  5.00   Hemoglobin 11.7 - 15.5 g/dL 19.1  47.8  29.5   HCT 35.0 - 45.0 % 41.0  41.4  40.3   Platelets 140 - 400 Thousand/uL 349  380  355   NEUT# 1.7 - 7.7 K/uL   5.1   Lymph# 0.7 - 4.0 K/uL   1.9      There is no height or weight on file to calculate BMI.  Orders:  Orders Placed This Encounter  Procedures   XR Foot Complete Right   No orders  of the defined types were placed in this encounter.    Procedures: No procedures performed  Clinical Data: No additional findings.  ROS:  All other systems negative, except as noted in the HPI. Review of Systems  Objective: Vital Signs: There were no vitals taken for this visit.  Specialty Comments:  No specialty comments available.  PMFS History: Patient Active Problem List   Diagnosis Date Noted   Smoker 01/17/2023   Medication management 01/16/2023   Osteomyelitis (HCC) 12/22/2022   Past Medical History:  Diagnosis Date   Anxiety    Depression    Fibroid    Hernia, umbilical     Family History  Problem Relation Age of Onset   Mental retardation Father    Cancer Other    Hypertension Other     Past Surgical History:  Procedure Laterality Date   CESAREAN SECTION     TUBAL LIGATION     Social History   Occupational History   Not on file  Tobacco Use   Smoking status: Every Day    Current packs/day: 0.50    Types: Cigarettes   Smokeless  tobacco: Never  Vaping Use   Vaping status: Never Used  Substance and Sexual Activity   Alcohol use: Yes    Comment: on weekends    Drug use: Yes    Types: Marijuana    Comment: "X pill" on Saturday 09/25/19   Sexual activity: Yes    Birth control/protection: Surgical    Comment: Tubal Ligation

## 2023-10-08 ENCOUNTER — Telehealth: Payer: Self-pay | Admitting: Orthopedic Surgery

## 2023-10-08 NOTE — Telephone Encounter (Signed)
 Patient called. She would like something for pain called in for her pain. Her cb# [phone]

## 2023-10-09 NOTE — Telephone Encounter (Signed)
 Can you please call pt she needs follow up in office first.

## 2023-10-17 ENCOUNTER — Telehealth: Payer: Self-pay | Admitting: Orthopedic Surgery

## 2023-10-17 NOTE — Telephone Encounter (Signed)
 Patient called and ask if she could get something for pain. Cb#[phone]

## 2023-10-17 NOTE — Telephone Encounter (Signed)
 This pt has not been in the office since March and saw advised if continued needed to follow up in the office. Can you call and make an appt please?

## 2023-10-22 ENCOUNTER — Ambulatory Visit: Admitting: Family

## 2023-10-29 ENCOUNTER — Encounter: Payer: Self-pay | Admitting: Family

## 2023-10-29 ENCOUNTER — Ambulatory Visit (INDEPENDENT_AMBULATORY_CARE_PROVIDER_SITE_OTHER): Admitting: Family

## 2023-10-29 ENCOUNTER — Other Ambulatory Visit (INDEPENDENT_AMBULATORY_CARE_PROVIDER_SITE_OTHER): Payer: Self-pay

## 2023-10-29 DIAGNOSIS — M79671 Pain in right foot: Secondary | ICD-10-CM

## 2023-10-29 DIAGNOSIS — L97511 Non-pressure chronic ulcer of other part of right foot limited to breakdown of skin: Secondary | ICD-10-CM | POA: Diagnosis not present

## 2023-10-29 MED ORDER — TERBINAFINE HCL 250 MG PO TABS: 250.0000 mg | ORAL_TABLET | Freq: Every day | ORAL | 0 refills | Status: DC

## 2023-10-29 NOTE — Progress Notes (Signed)
 Office Visit Note   Patient: Renee Herrera           Date of Birth: [birth year]           MRN: [id number] Visit Date: 10/29/2023              Requested by: No referring provider defined for this encounter. PCP: Pcp, No  Chief Complaint  Patient presents with   Right Foot - Pain      HPI: The patient is a 48 year old woman who presents in follow-up for ongoing pain to the right little toe she reports this initially began in June of last year she has a using topical antifungals without any relief she appears to be using Hydrofera Blue without relief at this time  Ongoing pain in the webspace as well as to the plantar aspect of the little toe  She also complains of painful calluses and corns beneath the 1st and 5th metatarsal heads.  She declined intervention today  Assessment & Plan: Visit Diagnoses: No diagnosis found.  Plan: Offered debridement of the Wagner grade 1 ulcers to the right foot.  Patient declined.  She prefers to debride these herself at home.  Order for oral Lamisil  provided as she has failed to improve with topical.  Follow-Up Instructions: No follow-ups on file.   Ortho Exam  Patient is alert, oriented, no adenopathy, well-dressed, normal affect, normal respiratory effort. On examination of the right foot she has a palpable dorsalis pedis pulse as well as good range of motion of the ankle and subtalar joint.  She continues to have hyperkeratotic tissue buildup beneath the 1st and 5th metatarsal head.  Wagner grade 1 ulcers.  There are corns present.  She declined debridement.  There is fungal rash in the fourth webspace with a fissure there is no drainage no erythema no edema no sausage digit swelling  Imaging: No results found. No images are attached to the encounter.  Labs: Lab Results  Component Value Date   HGBA1C 5.6 01/08/2023   ESRSEDRATE 14 01/16/2023   ESRSEDRATE 38 (H) 12/28/2022   ESRSEDRATE 12 12/22/2022   CRP <3.0 01/16/2023   CRP 6.2  (H) 12/28/2022   CRP 1.9 (H) 12/22/2022   REPTSTATUS 12/27/2022 FINAL 12/22/2022   CULT  12/22/2022    NO GROWTH 5 DAYS Performed at [facility], [address], [place], [place] [address]      Lab Results  Component Value Date   ALBUMIN 3.7 12/22/2022   ALBUMIN 4.0 06/14/2021   ALBUMIN 3.9 12/23/2019    Lab Results  Component Value Date   MG 1.9 12/26/2022   MG 1.9 12/23/2022   No results found for: "VD25OH"  No results found for: "PREALBUMIN"    Latest Ref Rng & Units 02/06/2023    3:15 PM 01/16/2023    3:59 PM 12/28/2022    7:25 PM  CBC EXTENDED  WBC 3.8 - 10.8 Thousand/uL 7.2  7.7  7.6   RBC 3.80 - 5.10 Million/uL 4.70  4.86  5.00   Hemoglobin 11.7 - 15.5 g/dL 19.1  47.8  29.5   HCT 35.0 - 45.0 % 41.0  41.4  40.3   Platelets 140 - 400 Thousand/uL 349  380  355   NEUT# 1.7 - 7.7 K/uL   5.1   Lymph# 0.7 - 4.0 K/uL   1.9      There is no height or weight on file to calculate BMI.  Orders:  No orders of  the defined types were placed in this encounter.  No orders of the defined types were placed in this encounter.    Procedures: No procedures performed  Clinical Data: No additional findings.  ROS:  All other systems negative, except as noted in the HPI. Review of Systems  Objective: Vital Signs: There were no vitals taken for this visit.  Specialty Comments:  No specialty comments available.  PMFS History: Patient Active Problem List   Diagnosis Date Noted   Smoker 01/17/2023   Medication management 01/16/2023   Osteomyelitis (HCC) 12/22/2022   Past Medical History:  Diagnosis Date   Anxiety    Depression    Fibroid    Hernia, umbilical     Family History  Problem Relation Age of Onset   Mental retardation Father    Cancer Other    Hypertension Other     Past Surgical History:  Procedure Laterality Date   CESAREAN SECTION     TUBAL LIGATION     Social History   Occupational History   Not on file  Tobacco Use   Smoking  status: Every Day    Current packs/day: 0.50    Types: Cigarettes   Smokeless tobacco: Never  Vaping Use   Vaping status: Never Used  Substance and Sexual Activity   Alcohol use: Yes    Comment: on weekends    Drug use: Yes    Types: Marijuana    Comment: "X pill" on Saturday 09/25/19   Sexual activity: Yes    Birth control/protection: Surgical    Comment: Tubal Ligation

## 2023-11-19 ENCOUNTER — Ambulatory Visit: Admitting: Family

## 2023-11-19 ENCOUNTER — Encounter: Payer: Self-pay | Admitting: Family

## 2023-11-19 DIAGNOSIS — L97511 Non-pressure chronic ulcer of other part of right foot limited to breakdown of skin: Secondary | ICD-10-CM

## 2023-11-19 DIAGNOSIS — B353 Tinea pedis: Secondary | ICD-10-CM | POA: Diagnosis not present

## 2023-11-19 DIAGNOSIS — L84 Corns and callosities: Secondary | ICD-10-CM | POA: Diagnosis not present

## 2023-11-19 NOTE — Progress Notes (Signed)
 Office Visit Note   Patient: Renee Herrera           Date of Birth: [birth year]           MRN: [id number] Visit Date: 11/19/2023              Requested by: No referring provider defined for this encounter. PCP: Pcp, No  Chief Complaint  Patient presents with   Right Foot - Follow-up      HPI: The patient is a 48 year old woman who is seen in follow-up for pain and skin breakdown in the fourth webspace of the right foot.  She has a history of corns on this foot but she prefers to debride these herself.  Has currently been completing a course of p.o. Lamisil  for persistent fungal infection in the fourth webspace  Reports modest improvement, cannot tolerate packing the webspace  Assessment & Plan: Visit Diagnoses:  1. Skin ulcer of toe of right foot, limited to breakdown of skin (HCC)   2. Corn of toe   3. Tinea pedis of right foot     Plan: Patient anxious attempted debridement in office with the digital block she did not tolerate this well she was given a silicone toe sleeve to wear over the fourth toe will follow-up in the office in 2 weeks continue Lamisil  continue keeping the webspace clean and dry  Follow-Up Instructions: No follow-ups on file.   Ortho Exam  Patient is alert, oriented, no adenopathy, well-dressed, normal affect, normal respiratory effort. On examination right foot has a palpable dorsalis pedis pulse.  She has good range of motion of the ankle there is no edema or erythema to the fifth toe there is fungal rash with maceration of the skin in the webspace there is corn to the medial border of the IP joint of the fifth toe there is no open wound no drainage no erythema Imaging: No results found. No images are attached to the encounter.  Labs: Lab Results  Component Value Date   HGBA1C 5.6 01/08/2023   ESRSEDRATE 14 01/16/2023   ESRSEDRATE 38 (H) 12/28/2022   ESRSEDRATE 12 12/22/2022   CRP <3.0 01/16/2023   CRP 6.2 (H) 12/28/2022   CRP 1.9 (H)  12/22/2022   REPTSTATUS 12/27/2022 FINAL 12/22/2022   CULT  12/22/2022    NO GROWTH 5 DAYS Performed at [facility], [address], [place], [place] [address]      Lab Results  Component Value Date   ALBUMIN 3.7 12/22/2022   ALBUMIN 4.0 06/14/2021   ALBUMIN 3.9 12/23/2019    Lab Results  Component Value Date   MG 1.9 12/26/2022   MG 1.9 12/23/2022   No results found for: "VD25OH"  No results found for: "PREALBUMIN"    Latest Ref Rng & Units 02/06/2023    3:15 PM 01/16/2023    3:59 PM 12/28/2022    7:25 PM  CBC EXTENDED  WBC 3.8 - 10.8 Thousand/uL 7.2  7.7  7.6   RBC 3.80 - 5.10 Million/uL 4.70  4.86  5.00   Hemoglobin 11.7 - 15.5 g/dL 21.3  08.6  57.8   HCT 35.0 - 45.0 % 41.0  41.4  40.3   Platelets 140 - 400 Thousand/uL 349  380  355   NEUT# 1.7 - 7.7 K/uL   5.1   Lymph# 0.7 - 4.0 K/uL   1.9      There is no height or weight on file to calculate BMI.  Orders:  No orders of the defined types were placed in this encounter.  No orders of the defined types were placed in this encounter.    Procedures: Nail Removal  Date/Time: 11/19/2023 4:24 PM  Performed by: Reuben Castilla, NP Authorized by: Reuben Castilla, NP   Consent:    Consent obtained:  Verbal   Consent given by:  Patient   Risks discussed:  Bleeding, incomplete removal, infection, pain and permanent nail deformity   Alternatives discussed:  No treatment, delayed treatment, alternative treatment and observation Universal protocol:    Procedure explained and questions answered to patient or proxy's satisfaction: yes     Relevant documents present and verified: yes     Test results available: yes     Imaging studies available: yes     Required blood products, implants, devices, and special equipment available: yes     Site/side marked: yes     Immediately prior to procedure a time out was called: yes     Patient identity confirmed:  Verbally with patient Location:    Foot:  R little  toe Pre-procedure details:    Skin preparation:  Betadine   Preparation: Patient was prepped and draped in the usual sterile fashion   Anesthesia:    Anesthesia method:  Local infiltration   Local anesthetic:  Lidocaine 1% w/o epi Post-procedure details:    Procedure completion:  Procedure terminated at patient's request   Clinical Data: No additional findings.  ROS:  All other systems negative, except as noted in the HPI. Review of Systems  Objective: Vital Signs: There were no vitals taken for this visit.  Specialty Comments:  No specialty comments available.  PMFS History: Patient Active Problem List   Diagnosis Date Noted   Smoker 01/17/2023   Medication management 01/16/2023   Osteomyelitis (HCC) 12/22/2022   Past Medical History:  Diagnosis Date   Anxiety    Depression    Fibroid    Hernia, umbilical     Family History  Problem Relation Age of Onset   Mental retardation Father    Cancer Other    Hypertension Other     Past Surgical History:  Procedure Laterality Date   CESAREAN SECTION     TUBAL LIGATION     Social History   Occupational History   Not on file  Tobacco Use   Smoking status: Every Day    Current packs/day: 0.50    Types: Cigarettes   Smokeless tobacco: Never  Vaping Use   Vaping status: Never Used  Substance and Sexual Activity   Alcohol use: Yes    Comment: on weekends    Drug use: Yes    Types: Marijuana    Comment: "X pill" on Saturday 09/25/19   Sexual activity: Yes    Birth control/protection: Surgical    Comment: Tubal Ligation

## 2023-12-13 ENCOUNTER — Other Ambulatory Visit: Payer: Self-pay

## 2023-12-13 ENCOUNTER — Encounter (HOSPITAL_COMMUNITY): Payer: Self-pay

## 2023-12-13 ENCOUNTER — Emergency Department (HOSPITAL_COMMUNITY)
Admission: EM | Admit: 2023-12-13 | Discharge: 2023-12-13 | Disposition: A | Discharge status: Home / Self Care | Attending: Emergency Medicine | Admitting: Emergency Medicine

## 2023-12-13 DIAGNOSIS — J020 Streptococcal pharyngitis: Secondary | ICD-10-CM | POA: Insufficient documentation

## 2023-12-13 DIAGNOSIS — J029 Acute pharyngitis, unspecified: Secondary | ICD-10-CM | POA: Diagnosis present

## 2023-12-13 DIAGNOSIS — F17210 Nicotine dependence, cigarettes, uncomplicated: Secondary | ICD-10-CM | POA: Diagnosis not present

## 2023-12-13 LAB — GROUP A STREP BY PCR: Group A Strep by PCR: DETECTED — AB

## 2023-12-13 MED ORDER — DEXAMETHASONE 1 MG/ML PO CONC
10.0000 mg | Freq: Once | ORAL | Status: AC
  Administered 2023-12-13: 10 mg via ORAL
  Filled 2023-12-13 (×2): qty 10

## 2023-12-13 MED ORDER — ACETAMINOPHEN 325 MG PO TABS: 650.0000 mg | ORAL_TABLET | Freq: Four times a day (QID) | ORAL | 0 refills | Status: AC | PRN

## 2023-12-13 MED ORDER — KETOROLAC TROMETHAMINE 15 MG/ML IJ SOLN
15.0000 mg | Freq: Once | INTRAMUSCULAR | Status: AC
  Administered 2023-12-13: 15 mg via INTRAMUSCULAR
  Filled 2023-12-13: qty 1

## 2023-12-13 MED ORDER — LIDOCAINE VISCOUS HCL 2 % MT SOLN: 15.0000 mL | OROMUCOSAL | 0 refills | Status: DC | PRN

## 2023-12-13 MED ORDER — IBUPROFEN 600 MG PO TABS: 600.0000 mg | ORAL_TABLET | Freq: Four times a day (QID) | ORAL | 0 refills | Status: AC | PRN

## 2023-12-13 MED ORDER — AMOXICILLIN-POT CLAVULANATE 875-125 MG PO TABS: 1.0000 | ORAL_TABLET | Freq: Two times a day (BID) | ORAL | 0 refills | Status: DC

## 2023-12-13 NOTE — ED Provider Notes (Signed)
 [facility] Provider Note  CSN: [id number] Arrival date & time: 12/13/23 9378  Chief Complaint(s) Sore Throat  HPI Renee Herrera is a 48 y.o. female with past medical history as below, significant for anxiety, depression, umbilical hernia, tobacco use who presents to the ED with complaint of sore throat  Feeling well x 24 hours, throat pain, dysphagia, no dyspnea.  No fever.  Occasional cough nonproductive.  No vomiting.  Medication prior to arrival.  No sick contacts but works at [employer] sees many people in a day.  Denies history of recurrent pharyngitis/ tonsillitis  Past Medical History Past Medical History:  Diagnosis Date   Anxiety    Depression    Fibroid    Hernia, umbilical    Patient Active Problem List   Diagnosis Date Noted   Smoker 01/17/2023   Medication management 01/16/2023   Osteomyelitis (HCC) 12/22/2022   Home Medication(s) Prior to Admission medications   Medication Sig Start Date End Date Taking? Authorizing Provider  ibuprofen  (ADVIL ) 600 MG tablet Take 1 tablet (600 mg total) by mouth every 6 (six) hours as needed. 12/22/22   Van Knee, MD  oxyCODONE  (OXY IR/ROXICODONE ) 5 MG immediate release tablet Take 1 tablet (5 mg total) by mouth every 6 (six) hours as needed for severe pain. Patient not taking: Reported on 02/06/2023 12/27/22   Krishnan, Gokul, MD  terbinafine  (LAMISIL ) 1 % cream Apply topically 2 (two) times daily. Patient not taking: Reported on 02/06/2023 12/27/22   Krishnan, Gokul, MD  terbinafine  (LAMISIL ) 250 MG tablet Take 1 tablet (250 mg total) by mouth daily. 10/29/23   Valdemar Rocky SAUNDERS, NP  urea  (CARMOL) 10 % cream Apply topically as needed. 01/23/23   Standiford, Marsa FALCON, DPM                                                                                                                                    Past Surgical History Past Surgical History:  Procedure Laterality Date    CESAREAN SECTION     TUBAL LIGATION     Family History Family History  Problem Relation Age of Onset   Mental retardation Father    Cancer Other    Hypertension Other     Social History Social History   Tobacco Use   Smoking status: Every Day    Current packs/day: 0.50    Types: Cigarettes   Smokeless tobacco: Never  Vaping Use   Vaping status: Never Used  Substance Use Topics   Alcohol use: Yes    Comment: on weekends    Drug use: Yes    Types: Marijuana    Comment: X pill on Saturday 09/25/19   Allergies Patient has no known allergies.  Review of Systems A thorough review of systems was obtained and all systems are negative except as noted in the HPI and PMH.   Physical Exam  Vital Signs  I have reviewed the triage vital signs BP (!) 142/76 (BP Location: Left Arm)   Pulse 93   Temp 99.1 F (37.3 C) (Oral)   Resp 16   Ht 5' 6 (1.676 m)   SpO2 100%   BMI 27.44 kg/m  Physical Exam Vitals and nursing note reviewed.  Constitutional:      General: She is not in acute distress.    Appearance: Normal appearance. She is well-developed. She is not ill-appearing.  HENT:     Head: Normocephalic and atraumatic.     Jaw: There is normal jaw occlusion. No trismus or swelling.     Right Ear: External ear normal.     Left Ear: External ear normal.     Nose: Nose normal.     Mouth/Throat:     Mouth: Mucous membranes are moist.     Pharynx: Uvula midline. Pharyngeal swelling, oropharyngeal exudate and posterior oropharyngeal erythema present. No uvula swelling.     Comments: Tonsillar swelling noted bilateral Uvula is midline  Eyes:     General: No scleral icterus.       Right eye: No discharge.        Left eye: No discharge.    Cardiovascular:     Rate and Rhythm: Normal rate.  Pulmonary:     Effort: Pulmonary effort is normal. No respiratory distress.     Breath sounds: No stridor.  Abdominal:     General: Abdomen is flat. There is no distension.      Tenderness: There is no guarding.   Musculoskeletal:        General: No deformity.     Cervical back: No rigidity.   Skin:    General: Skin is warm and dry.     Coloration: Skin is not cyanotic, jaundiced or pale.   Neurological:     Mental Status: She is alert and oriented to person, place, and time.     GCS: GCS eye subscore is 4. GCS verbal subscore is 5. GCS motor subscore is 6.   Psychiatric:        Speech: Speech normal.        Behavior: Behavior normal. Behavior is cooperative.     ED Results and Treatments Labs (all labs ordered are listed, but only abnormal results are displayed) Labs Reviewed  GROUP A STREP BY PCR - Abnormal; Notable for the following components:      Result Value   Group A Strep by PCR DETECTED (*)    All other components within normal limits                                                                                                                          Radiology No results found.  Pertinent labs & imaging results that were available during my care of the patient were reviewed by me and considered in my medical decision making (see MDM for details).  Medications Ordered in ED Medications  dexamethasone  (DECADRON ) 1 MG/ML solution 10 mg (has no administration in time range)  ketorolac  (TORADOL ) 15 MG/ML injection 15 mg (has no administration in time range)                                                                                                                                     Procedures Procedures  (including critical care time)  Medical Decision Making / ED Course    Medical Decision Making:    Renee Herrera is a 48 y.o. female with past medical history as below, significant for anxiety, depression, umbilical hernia, tobacco use who presents to the ED with complaint of sore throat. The complaint involves an extensive differential diagnosis and also carries with it a high risk of complications and morbidity.  Serious  etiology was considered. Ddx includes but is not limited to: Viral pharyngitis, bacterial pharyngitis, PTA, RPA, STI, uvulitis, etc.  Complete initial physical exam performed, notably the patient was in no distress, no hypoxia.    Reviewed and confirmed nursing documentation for past medical history, family history, social history.  Vital signs reviewed.     Brief summary:  48 yo/f w/ hx as above here with sore throat ongoing around 24 hrs Exam concerning for erythema of posterior oropharynx with some exudate  PTA is less likely, uvula is midline No drooling stridor trismus Strep +tive Will give toradol /viscous lido Abx for home with analgesia Work note Strict return precations  Patient in no distress and overall condition improved here in the ED. Detailed discussions were had with the patient/guardian regarding current findings, and need for close f/u with PCP or on call doctor. The patient/guardian has been instructed to return immediately if the symptoms worsen in any way for re-evaluation. Patient/guardian verbalized understanding and is in agreement with current care plan. All questions answered prior to discharge.                      Additional history obtained: -Additional history obtained from na -External records from outside source obtained and reviewed including: Chart review including previous notes, labs, imaging, consultation notes including  Allergy list (NA) Primary care documentation   Lab Tests: -I ordered, reviewed, and interpreted labs.   The pertinent results include:   Labs Reviewed  GROUP A STREP BY PCR - Abnormal; Notable for the following components:      Result Value   Group A Strep by PCR DETECTED (*)    All other components within normal limits    Notable for strep+  EKG   EKG Interpretation Date/Time:    Ventricular Rate:    PR Interval:    QRS Duration:    QT Interval:    QTC Calculation:   R Axis:      Text  Interpretation:           Imaging Studies ordered: na   Medicines ordered and prescription  drug management: Meds ordered this encounter  Medications   dexamethasone  (DECADRON ) 1 MG/ML solution 10 mg   ketorolac  (TORADOL ) 15 MG/ML injection 15 mg    -I have reviewed the patients home medicines and have made adjustments as needed   Consultations Obtained: na   Cardiac Monitoring: Continuous pulse oximetry interpreted by myself, 100% on ra.    Social Determinants of Health:  Diagnosis or treatment significantly limited by social determinants of health: current smoker Counseled patient for approximately 3 minutes regarding smoking cessation. Discussed risks of smoking and how they applied and affected their visit here today.  CPT code: 00593: intermediate counseling for smoking cessation     Reevaluation: After the interventions noted above, I reevaluated the patient and found that they have improved  Co morbidities that complicate the patient evaluation  Past Medical History:  Diagnosis Date   Anxiety    Depression    Fibroid    Hernia, umbilical       Dispostion: Disposition decision including need for hospitalization was considered, and patient discharged from emergency department.    Final Clinical Impression(s) / ED Diagnoses Final diagnoses:  Strep pharyngitis        Elnor Jayson LABOR, DO 12/13/23 0730

## 2023-12-13 NOTE — ED Triage Notes (Signed)
 Pt is coming in with a sore throat that started yesterday but worsening this morning, she mentions it feels like glass in her throat as well as having white patches in the back of her throat. No reported fevers at home

## 2023-12-13 NOTE — Discharge Instructions (Addendum)
 It was a pleasure caring for you today in the emergency department.  Drink plenty of fluids, get plenty of rest. Follow up with your pcp  Please return to the emergency department for any worsening or worrisome symptoms including but not limited to worsening pain, fever, unable to swallow, difficulty breathing, etc

## 2024-01-23 ENCOUNTER — Ambulatory Visit (INDEPENDENT_AMBULATORY_CARE_PROVIDER_SITE_OTHER): Admitting: Primary Care

## 2024-01-27 ENCOUNTER — Telehealth (INDEPENDENT_AMBULATORY_CARE_PROVIDER_SITE_OTHER): Payer: Self-pay | Admitting: Primary Care

## 2024-01-27 NOTE — Telephone Encounter (Signed)
 Called pt to confirm appt. Pt did not answer and LVM

## 2024-01-28 ENCOUNTER — Encounter (INDEPENDENT_AMBULATORY_CARE_PROVIDER_SITE_OTHER): Payer: Self-pay | Admitting: Primary Care

## 2024-01-28 ENCOUNTER — Ambulatory Visit (INDEPENDENT_AMBULATORY_CARE_PROVIDER_SITE_OTHER): Admitting: Primary Care

## 2024-01-28 VITALS — BP 113/78 | HR 69 | Temp 98.4°F | Resp 16 | Ht 66.0 in | Wt 165.0 lb

## 2024-01-28 DIAGNOSIS — M79671 Pain in right foot: Secondary | ICD-10-CM | POA: Diagnosis not present

## 2024-01-28 DIAGNOSIS — Z1211 Encounter for screening for malignant neoplasm of colon: Secondary | ICD-10-CM

## 2024-01-28 DIAGNOSIS — K429 Umbilical hernia without obstruction or gangrene: Secondary | ICD-10-CM | POA: Diagnosis not present

## 2024-01-28 DIAGNOSIS — Z131 Encounter for screening for diabetes mellitus: Secondary | ICD-10-CM | POA: Diagnosis not present

## 2024-01-28 DIAGNOSIS — M25511 Pain in right shoulder: Secondary | ICD-10-CM

## 2024-01-28 LAB — POCT GLYCOSYLATED HEMOGLOBIN (HGB A1C): HbA1c POC (<> result, manual entry): 5.4 % (ref 4.0–5.6)

## 2024-01-28 NOTE — Progress Notes (Signed)
 error

## 2024-01-28 NOTE — Progress Notes (Signed)
 [facility]  Renee Herrera, is a 48 y.o. female  RDW:[id number]  FMW:[id number]  DOB - [birth year]  Chief Complaint  Patient presents with   foot concerns   Shoulder Pain    Right shoulder pain 6/10 Limited ROM Onset 3 weeks ago Not helping with Tylenol    paper work    Question about getting forms filled out for disability.   Abdominal Pain    Hurts everyday I wake up. Intermittent throughout the day.        Subjective:   Renee Herrera is a 48 y.o. female here today for an acute visit.  Patient has been here for an acute visit and also a checkup.  She voices multiple complaints she had osteomyelitis of the right foot treated and followed by Dr. Harden.  She also has right shoulder pain which started 3 weeks ago months.  When she lift arms up -ROM but right arm elicits pain.  She has been taking Tylenol  and ibuprofen  which is not helping she rates her pain 6 out of 10.  Abdominal Pain: Patient complains of abdominal pain. The pain is described as aching and dull, and is 7/10 in intensity. Pain is located in the periumbilical bilateral without radiation. Onset was 10 years ago. Symptoms have been gradually worsening since. Aggravating factors: dairy products, eating, fatty foods, and sweating.  Alleviating factors: hot or cold surface naked resolve. Associated symptoms: frequency, sweats, and stress incontinent . The patient denies belching, fever, flatus, headache, hematochezia, hematuria, and nausea.     No problems updated.  Comprehensive ROS Pertinent positive and negative noted in HPI   No Known Allergies  Past Medical History:  Diagnosis Date   Anxiety    Depression    Fibroid    Hernia, umbilical     Current Outpatient Medications on File Prior to Visit  Medication Sig Dispense Refill   acetaminophen  (TYLENOL ) 325 MG tablet Take 2 tablets (650 mg total) by mouth every 6 (six) hours as needed. 36 tablet 0   ibuprofen  (ADVIL ) 600 MG tablet Take 1 tablet  (600 mg total) by mouth every 6 (six) hours as needed. 30 tablet 0   urea  (CARMOL) 10 % cream Apply topically as needed. 71 g 0   amoxicillin -clavulanate (AUGMENTIN ) 875-125 MG tablet Take 1 tablet by mouth every 12 (twelve) hours. 14 tablet 0   lidocaine  (XYLOCAINE ) 2 % solution Use as directed 15 mLs in the mouth or throat as needed for mouth pain. (Patient not taking: Reported on 01/28/2024) 100 mL 0   terbinafine  (LAMISIL ) 250 MG tablet Take 1 tablet (250 mg total) by mouth daily. (Patient not taking: Reported on 01/28/2024) 42 tablet 0   No current facility-administered medications on file prior to visit.   Health Maintenance  Topic Date Due   COVID-19 Vaccine (1) Never done   DTaP/Tdap/Td vaccine (1 - Tdap) Never done   Hepatitis B Vaccine (1 of 3 - 19+ 3-dose series) Never done   Pap with HPV screening  Never done   Colon Cancer Screening  Never done   Flu Shot  09/21/2024*   Pneumococcal Vaccine for high risk medical condition (1 of 2 - PCV) 01/27/2025*   Hepatitis C Screening  Completed   HIV Screening  Completed   HPV Vaccine  Aged Out   Meningitis B Vaccine  Aged Out  *Topic was postponed. The date shown is not the original due date.    Objective:   Vitals:   01/28/24 0915  BP:  113/78  Pulse: 69  Resp: 16  Temp: 98.4 F (36.9 C)  TempSrc: Oral  SpO2: 99%  Weight: 165 lb (74.8 kg)  Height: 5' 6 (1.676 m)   BP Readings from Last 3 Encounters:  01/28/24 113/78  12/13/23 (!) 142/76  02/06/23 109/73      Physical Exam Vitals reviewed.  Constitutional:      Appearance: Normal appearance.  HENT:     Head: Normocephalic.     Right Ear: Tympanic membrane, ear canal and external ear normal.     Left Ear: Tympanic membrane, ear canal and external ear normal.     Nose: Nose normal.     Mouth/Throat:     Mouth: Mucous membranes are moist.  Eyes:     Extraocular Movements: Extraocular movements intact.     Pupils: Pupils are equal, round, and reactive to light.   Cardiovascular:     Rate and Rhythm: Normal rate.  Pulmonary:     Effort: Pulmonary effort is normal.     Breath sounds: Normal breath sounds.  Abdominal:     General: Abdomen is protuberant. Bowel sounds are normal. There is distension.     Palpations: Abdomen is soft. There is mass.     Tenderness: There is generalized abdominal tenderness.     Hernia: A hernia is present. Hernia is present in the umbilical area.  Musculoskeletal:        General: Normal range of motion.     Cervical back: Normal range of motion.  Skin:    General: Skin is warm and dry.  Neurological:     Mental Status: She is alert and oriented to person, place, and time.  Psychiatric:        Mood and Affect: Mood normal.        Behavior: Behavior normal.        Thought Content: Thought content normal.     Assessment & Plan  Renee Herrera was seen today for foot concerns, shoulder pain, paper work and abdominal pain.  Diagnoses and all orders for this visit:  Diabetes mellitus screening -     HgB A1c   Colon cancer screening -     Ambulatory referral to Gastroenterology  Periumbilical hernia    -     Ambulatory referral to General Surgery  Right foot pain -     AMB referral to orthopedics  Pain in joint of right shoulder Crepitus felt with manual manipulation  -     AMB referral to orthopedics  Patient have been counseled extensively about nutrition and exercise. Other issues discussed during this visit include: low cholesterol diet, weight control and daily exercise, foot care, annual eye examinations at Ophthalmology, importance of adherence with medications and regular follow-up. We also discussed long term complications of uncontrolled diabetes and hypertension.   Return in about 3 months (around 04/29/2024) for medical conditions.  The patient was given clear instructions to go to ER or return to medical center if symptoms don't improve, worsen or new problems develop. The patient verbalized  understanding. The patient was told to call to get lab results if they haven't heard anything in the next week.   This note has been created with Education officer, environmental. Any transcriptional errors are unintentional.   Renee SHAUNNA Bohr, NP 01/28/2024, 10:25 AM

## 2024-02-04 ENCOUNTER — Other Ambulatory Visit: Payer: Self-pay

## 2024-02-04 ENCOUNTER — Ambulatory Visit (INDEPENDENT_AMBULATORY_CARE_PROVIDER_SITE_OTHER): Admitting: Family

## 2024-02-04 DIAGNOSIS — M25511 Pain in right shoulder: Secondary | ICD-10-CM | POA: Diagnosis not present

## 2024-02-04 DIAGNOSIS — M2011 Hallux valgus (acquired), right foot: Secondary | ICD-10-CM
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
## 2024-02-09 ENCOUNTER — Other Ambulatory Visit: Payer: Self-pay | Admitting: General Surgery

## 2024-02-09 DIAGNOSIS — K429 Umbilical hernia without obstruction or gangrene: Secondary | ICD-10-CM

## 2024-02-12 ENCOUNTER — Encounter: Payer: Self-pay | Admitting: General Surgery

## 2024-02-17 ENCOUNTER — Ambulatory Visit
Admission: RE | Admit: 2024-02-17 | Discharge: 2024-02-17 | Disposition: A | Discharge status: Home / Self Care | Source: Ambulatory Visit | Attending: General Surgery | Admitting: General Surgery

## 2024-02-17 DIAGNOSIS — K429 Umbilical hernia without obstruction or gangrene: Secondary | ICD-10-CM

## 2024-02-19 ENCOUNTER — Ambulatory Visit (INDEPENDENT_AMBULATORY_CARE_PROVIDER_SITE_OTHER): Admitting: Orthopedic Surgery

## 2024-02-19 DIAGNOSIS — M21611 Bunion of right foot: Secondary | ICD-10-CM | POA: Diagnosis not present

## 2024-02-20 ENCOUNTER — Encounter: Payer: Self-pay | Admitting: Orthopedic Surgery

## 2024-02-20 NOTE — Progress Notes (Signed)
 Office Visit Note   Patient: Renee Herrera           Date of Birth: 1976-05-19           MRN: [id number] Visit Date: 02/19/2024              Requested by: Celestia Rosaline SQUIBB, NP [address] [place],  [place] [address] PCP: Celestia Rosaline SQUIBB, NP  Chief Complaint  Patient presents with   Right Foot - Pain      HPI: Discussed the use of AI scribe software for clinical note transcription with the patient, who gave verbal consent to proceed.  History of Present Illness    Patient is a 48 year old woman who presents with painful bunion deformity right foot.  She states she has failed conservative treatment including shoewear modifications.  Patient states she has pain with activities of daily living over the prominent bunion eminence.  Assessment & Plan: Visit Diagnoses:  1. Bunion, right foot     Plan: Assessment and Plan Assessment & Plan  Assessment moderate bunion deformity right great toe.  Plan: Discussed continued conservative versus surgical intervention.  Risks and benefits of surgery were discussed including infection neurovascular injury persistent pain nonhealing of the bone nonhealing of the skin risk for DVT need for additional surgery.  Patient states she understands wishes to proceed with surgery at this time.      Follow-Up Instructions: Return if symptoms worsen or fail to improve.   Ortho Exam  Patient is alert, oriented, no adenopathy, well-dressed, normal affect, normal respiratory effort. Physical Exam   Examination patient has a good dorsalis pedis pulse she has good ankle and good subtalar motion no Achilles contracture.  Patient does have mild clawing of the 2nd and 3rd toes with a prominent bunion deformity of the great toe.  Radiograph shows an intermetatarsal angle of 10 degrees and a hallux valgus angle of 35 degrees.  She does have a long 2nd and 3rd metatarsal radiographically to explain the claw toeing however she is asymptomatic  beneath the metatarsal heads and does not have symptoms from the mild clawing of the 2nd and 3rd toes.    Imaging: No results found. No images are attached to the encounter.  Labs: Lab Results  Component Value Date   HGBA1C 5.4 01/28/2024   HGBA1C 5.6 01/08/2023   ESRSEDRATE 14 01/16/2023   ESRSEDRATE 38 (H) 12/28/2022   ESRSEDRATE 12 12/22/2022   CRP <3.0 01/16/2023   CRP 6.2 (H) 12/28/2022   CRP 1.9 (H) 12/22/2022   REPTSTATUS 12/27/2022 FINAL 12/22/2022   CULT  12/22/2022    NO GROWTH 5 DAYS Performed at [facility], [address] [place], [place] [address]      Lab Results  Component Value Date   ALBUMIN 3.7 12/22/2022   ALBUMIN 4.0 06/14/2021   ALBUMIN 3.9 12/23/2019    Lab Results  Component Value Date   MG 1.9 12/26/2022   MG 1.9 12/23/2022   No results found for: VD25OH  No results found for: PREALBUMIN    Latest Ref Rng & Units 02/06/2023    3:15 PM 01/16/2023    3:59 PM 12/28/2022    7:25 PM  CBC EXTENDED  WBC 3.8 - 10.8 Thousand/uL 7.2  7.7  7.6   RBC 3.80 - 5.10 Million/uL 4.70  4.86  5.00   Hemoglobin 11.7 - 15.5 g/dL 86.2  85.9  85.5   HCT 35.0 - 45.0 % 41.0  41.4  40.3  Platelets 140 - 400 Thousand/uL 349  380  355   NEUT# 1.7 - 7.7 K/uL   5.1   Lymph# 0.7 - 4.0 K/uL   1.9      There is no height or weight on file to calculate BMI.  Orders:  No orders of the defined types were placed in this encounter.  No orders of the defined types were placed in this encounter.    Procedures: No procedures performed  Clinical Data: No additional findings.  ROS:  All other systems negative, except as noted in the HPI. Review of Systems  Objective: Vital Signs: LMP 01/11/2024 (Approximate)   Specialty Comments:  No specialty comments available.  PMFS History: Patient Active Problem List   Diagnosis Date Noted   Smoker 01/17/2023   Medication management 01/16/2023   Osteomyelitis (HCC) 12/22/2022   Past Medical History:   Diagnosis Date   Anxiety    Depression    Fibroid    Hernia, umbilical     Family History  Problem Relation Age of Onset   Mental retardation Father    Cancer Other    Hypertension Other     Past Surgical History:  Procedure Laterality Date   CESAREAN SECTION     TUBAL LIGATION     Social History   Occupational History   Not on file  Tobacco Use   Smoking status: Every Day    Current packs/day: 0.50    Types: Cigarettes   Smokeless tobacco: Never  Vaping Use   Vaping status: Never Used  Substance and Sexual Activity   Alcohol use: Yes    Comment: on weekends    Drug use: Yes    Types: Marijuana    Comment: X pill on Saturday 09/25/19   Sexual activity: Yes    Birth control/protection: Surgical    Comment: Tubal Ligation

## 2024-03-15 ENCOUNTER — Encounter: Payer: Self-pay | Admitting: Physician Assistant

## 2024-04-26 ENCOUNTER — Encounter: Payer: Self-pay | Admitting: Radiology

## 2024-04-28 ENCOUNTER — Telehealth (INDEPENDENT_AMBULATORY_CARE_PROVIDER_SITE_OTHER): Payer: Self-pay

## 2024-04-28 NOTE — Telephone Encounter (Signed)
 Reached out to pt to see if she is able to come to her 04/29/24 appt at 2:30pm or 2:50pm because the provider will not be in the office after 3:10pm.  Pt states she gets off work at 3 but she will find out if she can come in earlier and she will send a MyChart message to confirm if she is able to do so. Made pt aware that we will provide her a work note if she is able to so that way she is cover

## 2024-04-29 ENCOUNTER — Ambulatory Visit (INDEPENDENT_AMBULATORY_CARE_PROVIDER_SITE_OTHER): Admitting: Primary Care

## 2024-04-29 ENCOUNTER — Encounter (INDEPENDENT_AMBULATORY_CARE_PROVIDER_SITE_OTHER): Payer: Self-pay | Admitting: Primary Care

## 2024-04-29 VITALS — BP 120/81 | HR 71 | Resp 16 | Ht 66.0 in | Wt 166.0 lb

## 2024-04-29 DIAGNOSIS — R519 Headache, unspecified: Secondary | ICD-10-CM | POA: Diagnosis not present

## 2024-04-29 DIAGNOSIS — G8929 Other chronic pain: Secondary | ICD-10-CM

## 2024-04-29 DIAGNOSIS — T300 Burn of unspecified body region, unspecified degree: Secondary | ICD-10-CM

## 2024-04-29 NOTE — Progress Notes (Signed)
 [facility]  Renee Herrera, is a 48 y.o. female  RDW:[id number]  FMW:[id number]  DOB - 03/30/1976  Chief Complaint  Patient presents with   Headache    Pt does have a hx of migraines        Subjective:   Renee Herrera is a 48 y.o. female here today for an acute visit.  Headache  This is a recurrent problem. The current episode started more than 1 year ago. The problem has been gradually worsening. The pain is located in the Frontal region. The pain does not radiate. The pain quality is similar to prior headaches. The pain is at a severity of 7/10. The pain is moderate. Associated symptoms include blurred vision, nausea and a visual change. The symptoms are aggravated by caffeine withdrawal. She has tried NSAIDs for the symptoms. The treatment provided mild relief. Her past medical history is significant for migraine headaches and migraines in the family.    No problems updated.  Comprehensive ROS Pertinent positive and negative noted in HPI   No Known Allergies  Past Medical History:  Diagnosis Date   Anxiety    Depression    Fibroid    Hernia, umbilical     Current Outpatient Medications on File Prior to Visit  Medication Sig Dispense Refill   ibuprofen  (ADVIL ) 600 MG tablet Take 1 tablet (600 mg total) by mouth every 6 (six) hours as needed. 30 tablet 0   acetaminophen  (TYLENOL ) 325 MG tablet Take 2 tablets (650 mg total) by mouth every 6 (six) hours as needed. 36 tablet 0   urea  (CARMOL) 10 % cream Apply topically as needed. (Patient not taking: Reported on 04/29/2024) 71 g 0   No current facility-administered medications on file prior to visit.   Health Maintenance  Topic Date Due   COVID-19 Vaccine (1) Never done   DTaP/Tdap/Td vaccine (1 - Tdap) Never done   Hepatitis B Vaccine (1 of 3 - 19+ 3-dose series) Never done   Pap with HPV screening  Never done   Breast Cancer Screening  Never done   Colon Cancer Screening  Never done   Flu Shot   09/21/2024*   Pneumococcal Vaccine (1 of 2 - PCV) 01/27/2025*   Hepatitis C Screening  Completed   HIV Screening  Completed   HPV Vaccine  Aged Out   Meningitis B Vaccine  Aged Out  *Topic was postponed. The date shown is not the original due date.    Objective:   Vitals:   04/29/24 1442  BP: 120/81  Pulse: 71  Resp: 16  SpO2: 100%  Weight: 166 lb (75.3 kg)  Height: 5' 6 (1.676 m)   BP Readings from Last 3 Encounters:  04/29/24 120/81  01/28/24 113/78  12/13/23 (!) 142/76      Physical Exam Vitals reviewed.  Constitutional:      Appearance: Normal appearance.  HENT:     Head: Normocephalic.     Right Ear: Tympanic membrane, ear canal and external ear normal.     Left Ear: Tympanic membrane, ear canal and external ear normal.     Nose: Nose normal.     Mouth/Throat:     Mouth: Mucous membranes are moist.  Eyes:     Extraocular Movements: Extraocular movements intact.     Pupils: Pupils are equal, round, and reactive to light.  Cardiovascular:     Rate and Rhythm: Normal rate and regular rhythm.  Pulmonary:     Effort: Pulmonary effort is normal.  Breath sounds: Normal breath sounds.  Abdominal:     General: Bowel sounds are normal.     Palpations: Abdomen is soft.  Musculoskeletal:        General: Normal range of motion.     Cervical back: Normal range of motion.  Skin:    General: Skin is warm and dry.  Neurological:     Mental Status: She is alert and oriented to person, place, and time.  Psychiatric:        Mood and Affect: Mood normal.        Behavior: Behavior normal.        Thought Content: Thought content normal.       Assessment & Plan    Ross was seen today for headache.  Diagnoses and all orders for this visit:  Burn   Patient have been counseled extensively about nutrition and exercise. Other issues discussed during this visit include: low cholesterol diet, weight control and daily exercise, foot care, annual eye examinations at  Ophthalmology, importance of adherence with medications and regular follow-up. We also discussed long term complications of uncontrolled diabetes and hypertension.   Return in about 6 weeks (around 06/10/2024) for pap.  The patient was given clear instructions to go to ER or return to medical center if symptoms don't improve, worsen or new problems develop. The patient verbalized understanding. The patient was told to call to get lab results if they haven't heard anything in the next week.   This note has been created with Education officer, environmental. Any transcriptional errors are unintentional.   Rosaline SHAUNNA Bohr, NP 04/29/2024, 3:02 PM

## 2024-05-02 MED ORDER — SILVER SULFADIAZINE 1 % EX CREA
1.0000 | TOPICAL_CREAM | Freq: Every day | CUTANEOUS | 0 refills | Status: AC
Start: 2024-05-02 — End: ?

## 2024-05-04 ENCOUNTER — Ambulatory Visit: Payer: Self-pay | Admitting: General Surgery

## 2024-05-04 NOTE — Progress Notes (Signed)
 Sent message, via epic in basket, requesting orders in epic from Careers adviser.

## 2024-05-05 NOTE — Progress Notes (Deleted)
 Chief Complaint: Discuss colonoscopy and abdominal pain  HPI:    Renee Herrera is a 48 year old African-American female with past medical history as listed below including anxiety and depression, who was referred to me by Celestia Rosaline SQUIBB, NP for a complaint of nominal pain and to discuss a colonoscopy.      02/17/2024 CTAP without contrast for umbilical hernia and to evaluate for complication.  Patient noted to have a tiny hiatal hernia.  Supraumbilical ventral hernia containing fat with some stranding along the root of the hernia and an 11 mm aperture with, suggesting clinical correlation for reducibility, lobular uterine contour with multiple uterine masses compatible with leiomyomas.    03/02/2024 patient saw general surgery.  At that time having issues with obstructive symptoms.  Patient is scheduled for hernia repair in December.  Past Medical History:  Diagnosis Date   Anxiety    Depression    Fibroid    Hernia, umbilical     Past Surgical History:  Procedure Laterality Date   CESAREAN SECTION     TUBAL LIGATION      Current Outpatient Medications  Medication Sig Dispense Refill   acetaminophen  (TYLENOL ) 325 MG tablet Take 2 tablets (650 mg total) by mouth every 6 (six) hours as needed. 36 tablet 0   ibuprofen  (ADVIL ) 600 MG tablet Take 1 tablet (600 mg total) by mouth every 6 (six) hours as needed. 30 tablet 0   silver sulfADIAZINE (SILVADENE) 1 % cream Apply 1 Application topically daily. 50 g 0   urea  (CARMOL) 10 % cream Apply topically as needed. (Patient not taking: Reported on 04/29/2024) 71 g 0   No current facility-administered medications for this visit.    Allergies as of 05/06/2024   (No Known Allergies)    Family History  Problem Relation Age of Onset   Mental retardation Father    Cancer Other    Hypertension Other     Social History   Socioeconomic History   Marital status: Single    Spouse name: Not on file   Number of children: Not on file    Years of education: Not on file   Highest education level: Not on file  Occupational History   Not on file  Tobacco Use   Smoking status: Every Day    Current packs/day: 0.50    Types: Cigarettes   Smokeless tobacco: Never  Vaping Use   Vaping status: Never Used  Substance and Sexual Activity   Alcohol use: Yes    Comment: on weekends    Drug use: Yes    Types: Marijuana    Comment: X pill on Saturday 09/25/19   Sexual activity: Yes    Birth control/protection: Surgical    Comment: Tubal Ligation  Other Topics Concern   Not on file  Social History Narrative   Not on file   Social Drivers of Health   Financial Resource Strain: Not on file  Food Insecurity: Food Insecurity Present (01/29/2024)   Hunger Vital Sign    Worried About Running Out of Food in the Last Year: Sometimes true    Ran Out of Food in the Last Year: Never true  Transportation Needs: No Transportation Needs (01/29/2024)   PRAPARE - Administrator, Civil Service (Medical): No    Lack of Transportation (Non-Medical): No  Physical Activity: Not on file  Stress: Not on file  Social Connections: Unknown (01/29/2024)   Social Connection and Isolation Panel    Frequency of Communication  with Friends and Family: Three times a week    Frequency of Social Gatherings with Friends and Family: Once a week    Attends Religious Services: Never    Diplomatic Services Operational Officer: Not on file    Attends Banker Meetings: Never    Marital Status: Not on file  Intimate Partner Violence: Not on file    Review of Systems:    Constitutional: No weight loss, fever, chills, weakness or fatigue HEENT: Eyes: No change in vision               Ears, Nose, Throat:  No change in hearing or congestion Skin: No rash or itching Cardiovascular: No chest pain, chest pressure or palpitations   Respiratory: No SOB or cough Gastrointestinal: See HPI and otherwise negative Genitourinary: No dysuria or  change in urinary frequency Neurological: No headache, dizziness or syncope Musculoskeletal: No new muscle or joint pain Hematologic: No bleeding or bruising Psychiatric: No history of depression or anxiety    Physical Exam:  Vital signs: There were no vitals taken for this visit.  Constitutional:   Pleasant Caucasian female appears to be in NAD, Well developed, Well nourished, alert and cooperative Head:  Normocephalic and atraumatic. Eyes:   PEERL, EOMI. No icterus. Conjunctiva pink. Ears:  Normal auditory acuity. Neck:  Supple Throat: Oral cavity and pharynx without inflammation, swelling or lesion.  Respiratory: Respirations even and unlabored. Lungs clear to auscultation bilaterally.   No wheezes, crackles, or rhonchi.  Cardiovascular: Normal S1, S2. No MRG. Regular rate and rhythm. No peripheral edema, cyanosis or pallor.  Gastrointestinal:  Soft, nondistended, nontender. No rebound or guarding. Normal bowel sounds. No appreciable masses or hepatomegaly. Rectal:  Not performed.  Msk:  Symmetrical without gross deformities. Without edema, no deformity or joint abnormality.  Neurologic:  Alert and  oriented x4;  grossly normal neurologically.  Skin:   Dry and intact without significant lesions or rashes. Psychiatric: Oriented to person, place and time. Demonstrates good judgement and reason without abnormal affect or behaviors.  RELEVANT LABS AND IMAGING: CBC    Component Value Date/Time   WBC 7.2 02/06/2023 1515   RBC 4.70 02/06/2023 1515   HGB 13.7 02/06/2023 1515   HCT 41.0 02/06/2023 1515   PLT 349 02/06/2023 1515   MCV 87.2 02/06/2023 1515   MCH 29.1 02/06/2023 1515   MCHC 33.4 02/06/2023 1515   RDW 14.5 02/06/2023 1515   LYMPHSABS 1.9 12/28/2022 1925   MONOABS 0.5 12/28/2022 1925   EOSABS 0.0 12/28/2022 1925   BASOSABS 0.0 12/28/2022 1925    CMP     Component Value Date/Time   NA 137 02/06/2023 1515   K 4.0 02/06/2023 1515   CL 105 02/06/2023 1515   CO2 25  02/06/2023 1515   GLUCOSE 77 02/06/2023 1515   BUN 12 02/06/2023 1515   CREATININE 0.76 02/06/2023 1515   CALCIUM 8.8 02/06/2023 1515   PROT 6.8 12/22/2022 2020   ALBUMIN 3.7 12/22/2022 2020   AST 14 (L) 12/22/2022 2020   ALT 10 12/22/2022 2020   ALKPHOS 65 12/22/2022 2020   BILITOT 0.5 12/22/2022 2020   GFRNONAA >60 12/28/2022 1958   GFRAA >60 12/23/2019 1432    Assessment: 1.  Umbilical hernia: Saw general surgery in September and they have scheduled her hernia repair in December 2.  Discussed colonoscopy:  Plan: 1. ***     Delon Failing, PA-C [facility] 05/05/2024, 11:16 AM  Cc: Celestia Rosaline SQUIBB, NP

## 2024-05-06 ENCOUNTER — Ambulatory Visit: Admitting: Physician Assistant

## 2024-05-06 NOTE — Patient Instructions (Addendum)
 SURGICAL WAITING ROOM VISITATION Patients having surgery or a procedure may have no more than 2 support people in the waiting area - these visitors may rotate.    Children under the age of 30 must have an adult with them who is not the patient.  If the patient needs to stay at the hospital during part of their recovery, the visitor guidelines for inpatient rooms apply. Pre-op nurse will coordinate an appropriate time for 1 support person to accompany patient in pre-op.  This support person may not rotate.    Please refer to the [facility] website for the visitor guidelines for Inpatients (after your surgery is over and you are in a regular room).       Your procedure is scheduled on: 05-24-24   Report to [facility] Main Entrance    Report to admitting at 5:15 AM   Call this number if you have problems the morning of surgery [phone]   Do not eat food or drink liquids :After Midnight.          If you have questions, please contact your surgeon's office.   FOLLOW  ANY ADDITIONAL PRE OP INSTRUCTIONS YOU RECEIVED FROM YOUR SURGEON'S OFFICE!!!     Oral Hygiene is also important to reduce your risk of infection.                                    Remember - BRUSH YOUR TEETH THE MORNING OF SURGERY WITH YOUR REGULAR TOOTHPASTE   Do NOT smoke after Midnight   Take these medicines the morning of surgery with A SIP OF WATER:    Tylenol  if needed  Stop all vitamins and herbal supplements 7 days before surgery                              You may not have any metal on your body including hair pins, jewelry, and body piercing             Do not wear make-up, lotions, powders, perfumes, or deodorant  Do not wear nail polish including gel and S&S, artificial/acrylic nails, or any other type of covering on natural nails including finger and toenails. If you have artificial nails, gel coating, etc. that needs to be removed by a nail salon please have this removed prior to surgery  or surgery may need to be canceled/ delayed if the surgeon/ anesthesia feels like they are unable to be safely monitored.   Do not shave  48 hours prior to surgery.    Do not bring valuables to the hospital. [facility] IS NOT RESPONSIBLE   FOR VALUABLES.   Contacts, dentures or bridgework may not be worn into surgery.   DO NOT BRING YOUR HOME MEDICATIONS TO THE HOSPITAL. PHARMACY WILL DISPENSE MEDICATIONS LISTED ON YOUR MEDICATION LIST TO YOU DURING YOUR ADMISSION IN THE HOSPITAL!    Patients discharged on the day of surgery will not be allowed to drive home.  Someone NEEDS to stay with you for the first 24 hours after anesthesia.   Special Instructions: Bring a copy of your healthcare power of attorney and living will documents the day of surgery if you haven't scanned them before.              Please read over the following fact sheets you were given: IF YOU HAVE QUESTIONS  ABOUT YOUR PRE-OP INSTRUCTIONS PLEASE CALL [phone] [facility]  If you received a COVID test during your pre-op visit  it is requested that you wear a mask when out in public, stay away from anyone that may not be feeling well and notify your surgeon if you develop symptoms. If you test positive for Covid or have been in contact with anyone that has tested positive in the last 10 days please notify you surgeon.  [facility] - Preparing for Surgery Before surgery, you can play an important role.  Because skin is not sterile, your skin needs to be as free of germs as possible.  You can reduce the number of germs on your skin by washing with CHG (chlorahexidine gluconate) soap before surgery.  CHG is an antiseptic cleaner which kills germs and bonds with the skin to continue killing germs even after washing. Please DO NOT use if you have an allergy to CHG or antibacterial soaps.  If your skin becomes reddened/irritated stop using the CHG and inform your nurse when you arrive at Short Stay. Do not shave (including legs and  underarms) for at least 48 hours prior to the first CHG shower.  You may shave your face/neck.  Please follow these instructions carefully:  1.  Shower with CHG Soap the night before surgery ONLY (DO NOT USE THE SOAP THE MORNING OF SURGERY).  2.  If you choose to wash your hair, wash your hair first as usual with your normal  shampoo.  3.  After you shampoo, rinse your hair and body thoroughly to remove the shampoo.                             4.  Use CHG as you would any other liquid soap.  You can apply chg directly to the skin and wash.  Gently with a scrungie or clean washcloth.  5.  Apply the CHG Soap to your body ONLY FROM THE NECK DOWN.   Do   not use on face/ open                           Wound or open sores. Avoid contact with eyes, ears mouth and   genitals (private parts).                       Wash face,  Genitals (private parts) with your normal soap.             6.  Wash thoroughly, paying special attention to the area where your    surgery  will be performed.  7.  Thoroughly rinse your body with warm water from the neck down.  8.  DO NOT shower/wash with your normal soap after using and rinsing off the CHG Soap.                9.  Pat yourself dry with a clean towel.            10.  Wear clean pajamas.            11.  Place clean sheets on your bed the night of your first shower and do not  sleep with pets. Day of Surgery : Do not apply any CHG, lotions/deodorants the morning of surgery.  Please wear clean clothes to the hospital/surgery center.  FAILURE TO FOLLOW THESE INSTRUCTIONS MAY RESULT IN THE CANCELLATION OF  YOUR SURGERY  PATIENT SIGNATURE_________________________________  NURSE SIGNATURE__________________________________  ________________________________________________________________________

## 2024-05-11 ENCOUNTER — Encounter (HOSPITAL_COMMUNITY): Payer: Self-pay

## 2024-05-11 ENCOUNTER — Encounter (HOSPITAL_COMMUNITY)
Admission: RE | Admit: 2024-05-11 | Discharge: 2024-05-11 | Disposition: A | Discharge status: Home / Self Care | Source: Ambulatory Visit | Attending: General Surgery | Admitting: General Surgery

## 2024-05-11 ENCOUNTER — Other Ambulatory Visit: Payer: Self-pay

## 2024-05-11 VITALS — Ht 66.0 in | Wt 166.0 lb

## 2024-05-11 DIAGNOSIS — Z01818 Encounter for other preprocedural examination: Secondary | ICD-10-CM

## 2024-05-11 HISTORY — DX: Migraine, unspecified, not intractable, without status migrainosus: G43.909

## 2024-05-11 NOTE — Progress Notes (Signed)
 Date of COVID positive in last 90 days:  No  PCP - Rosaline Bohr, NP Cardiologist - N/A  Chest x-ray - N/A EKG - N/A Stress Test - N/A ECHO - N/A Cardiac Cath - N/A Pacemaker/ICD device last checked:N/A Spinal Cord Stimulator:N/A  Bowel Prep - N/A  Sleep Study - N/A CPAP -   Fasting Blood Sugar - N/A Checks Blood Sugar _____ times a day  Last dose of GLP1 agonist-  N/A GLP1 instructions:  Do not take after     Last dose of SGLT-2 inhibitors-  N/A SGLT-2 instructions:  Do not take after    Blood Thinner Instructions: N/A : Aspirin Instructions:N/A Last Dose:  Activity level:  Can go up a flight of stairs and perform activities of daily living without stopping and without symptoms of chest pain or shortness of breath.  Anesthesia review: N/A  Patient denies shortness of breath, fever, cough and chest pain at PAT appointment (completed over the phone)  Patient verbalized understanding of instructions that were given to them at the PAT appointment. Patient was also instructed that they will need to review over the PAT instructions again at home before surgery.

## 2024-05-24 ENCOUNTER — Ambulatory Visit (HOSPITAL_COMMUNITY)
Admission: RE | Admit: 2024-05-24 | Discharge: 2024-05-24 | Disposition: A | Discharge status: Home / Self Care | Source: Ambulatory Visit | Attending: General Surgery | Admitting: General Surgery

## 2024-05-24 ENCOUNTER — Encounter (HOSPITAL_COMMUNITY): Payer: Self-pay | Admitting: General Surgery

## 2024-05-24 ENCOUNTER — Ambulatory Visit (HOSPITAL_COMMUNITY): Admitting: Anesthesiology

## 2024-05-24 ENCOUNTER — Encounter (HOSPITAL_COMMUNITY)
Admission: RE | Disposition: A | Discharge status: Home / Self Care | Payer: Self-pay | Source: Ambulatory Visit | Attending: General Surgery

## 2024-05-24 ENCOUNTER — Other Ambulatory Visit: Payer: Self-pay

## 2024-05-24 ENCOUNTER — Ambulatory Visit (HOSPITAL_COMMUNITY): Payer: Self-pay | Admitting: Physician Assistant

## 2024-05-24 DIAGNOSIS — K429 Umbilical hernia without obstruction or gangrene: Secondary | ICD-10-CM

## 2024-05-24 DIAGNOSIS — Z01818 Encounter for other preprocedural examination: Secondary | ICD-10-CM

## 2024-05-24 HISTORY — PX: VENTRAL HERNIA REPAIR: SHX424

## 2024-05-24 LAB — CBC
HCT: 38.8 % (ref 36.0–46.0)
Hemoglobin: 14.3 g/dL (ref 12.0–15.0)
MCH: 29.9 pg (ref 26.0–34.0)
MCHC: 36.9 g/dL — ABNORMAL HIGH (ref 30.0–36.0)
MCV: 81.2 fL (ref 80.0–100.0)
Platelets: 328 K/uL (ref 150–400)
RBC: 4.78 MIL/uL (ref 3.87–5.11)
RDW: 13.9 % (ref 11.5–15.5)
WBC: 6.5 K/uL (ref 4.0–10.5)
nRBC: 0 % (ref 0.0–0.2)

## 2024-05-24 LAB — POCT PREGNANCY, URINE: Preg Test, Ur: NEGATIVE

## 2024-05-24 SURGERY — REPAIR, HERNIA, VENTRAL
Anesthesia: General | Site: Abdomen

## 2024-05-24 MED ORDER — LACTATED RINGERS IV SOLN: INTRAVENOUS | Status: DC

## 2024-05-24 MED ORDER — FENTANYL CITRATE (PF) 50 MCG/ML IJ SOSY: 25.0000 ug | PREFILLED_SYRINGE | INTRAMUSCULAR | Status: DC | PRN

## 2024-05-24 MED ORDER — FENTANYL CITRATE (PF) 100 MCG/2ML IJ SOLN
INTRAMUSCULAR | Status: DC | PRN
  Administered 2024-05-24 (×2): 50 ug via INTRAVENOUS
  Administered 2024-05-24: 100 ug via INTRAVENOUS

## 2024-05-24 MED ORDER — EPHEDRINE 5 MG/ML INJ
INTRAVENOUS | Status: AC
  Filled 2024-05-24: qty 5

## 2024-05-24 MED ORDER — CEFAZOLIN SODIUM-DEXTROSE 2-4 GM/100ML-% IV SOLN
2.0000 g | INTRAVENOUS | Status: AC
  Administered 2024-05-24: 2 g via INTRAVENOUS
  Filled 2024-05-24: qty 100

## 2024-05-24 MED ORDER — OXYCODONE HCL 5 MG PO TABS
ORAL_TABLET | ORAL | Status: AC
  Filled 2024-05-24: qty 1

## 2024-05-24 MED ORDER — PROPOFOL 10 MG/ML IV BOLUS
INTRAVENOUS | Status: DC | PRN
  Administered 2024-05-24: 150 mg via INTRAVENOUS

## 2024-05-24 MED ORDER — ONDANSETRON HCL 4 MG/2ML IJ SOLN: 4.0000 mg | Freq: Once | INTRAMUSCULAR | Status: DC | PRN

## 2024-05-24 MED ORDER — PROPOFOL 10 MG/ML IV BOLUS
INTRAVENOUS | Status: AC
  Filled 2024-05-24: qty 20

## 2024-05-24 MED ORDER — OXYCODONE HCL 5 MG PO TABS
5.0000 mg | ORAL_TABLET | Freq: Once | ORAL | Status: AC | PRN
  Administered 2024-05-24: 5 mg via ORAL

## 2024-05-24 MED ORDER — FENTANYL CITRATE (PF) 100 MCG/2ML IJ SOLN
INTRAMUSCULAR | Status: AC
  Filled 2024-05-24: qty 2

## 2024-05-24 MED ORDER — CHLORHEXIDINE GLUCONATE 0.12 % MT SOLN
15.0000 mL | Freq: Once | OROMUCOSAL | Status: AC
  Administered 2024-05-24: 15 mL via OROMUCOSAL

## 2024-05-24 MED ORDER — BUPIVACAINE-EPINEPHRINE (PF) 0.25% -1:200000 IJ SOLN
INTRAMUSCULAR | Status: AC
  Filled 2024-05-24: qty 30

## 2024-05-24 MED ORDER — ENOXAPARIN SODIUM 40 MG/0.4ML IJ SOSY
40.0000 mg | PREFILLED_SYRINGE | Freq: Once | INTRAMUSCULAR | Status: AC
  Administered 2024-05-24: 40 mg via SUBCUTANEOUS
  Filled 2024-05-24: qty 0.4

## 2024-05-24 MED ORDER — CHLORHEXIDINE GLUCONATE CLOTH 2 % EX PADS: 6.0000 | MEDICATED_PAD | Freq: Once | CUTANEOUS | Status: DC

## 2024-05-24 MED ORDER — CELECOXIB 200 MG PO CAPS
200.0000 mg | ORAL_CAPSULE | ORAL | Status: AC
  Administered 2024-05-24: 200 mg via ORAL
  Filled 2024-05-24: qty 1

## 2024-05-24 MED ORDER — ONDANSETRON HCL 4 MG/2ML IJ SOLN
INTRAMUSCULAR | Status: AC
  Filled 2024-05-24: qty 2

## 2024-05-24 MED ORDER — BUPIVACAINE-EPINEPHRINE 0.25% -1:200000 IJ SOLN
INTRAMUSCULAR | Status: DC | PRN
  Administered 2024-05-24: 20 mL

## 2024-05-24 MED ORDER — DEXMEDETOMIDINE HCL IN NACL 80 MCG/20ML IV SOLN
INTRAVENOUS | Status: AC
  Filled 2024-05-24: qty 20

## 2024-05-24 MED ORDER — OXYCODONE HCL 5 MG/5ML PO SOLN: 5.0000 mg | Freq: Once | ORAL | Status: AC | PRN

## 2024-05-24 MED ORDER — EPHEDRINE SULFATE (PRESSORS) 25 MG/5ML IV SOSY
PREFILLED_SYRINGE | INTRAVENOUS | Status: DC | PRN
  Administered 2024-05-24 (×2): 10 mg via INTRAVENOUS

## 2024-05-24 MED ORDER — GLYCOPYRROLATE 0.2 MG/ML IJ SOLN
INTRAMUSCULAR | Status: DC | PRN
  Administered 2024-05-24: .2 mg via INTRAVENOUS

## 2024-05-24 MED ORDER — ACETAMINOPHEN 10 MG/ML IV SOLN: 1000.0000 mg | Freq: Once | INTRAVENOUS | Status: DC | PRN

## 2024-05-24 MED ORDER — DEXMEDETOMIDINE HCL IN NACL 80 MCG/20ML IV SOLN
INTRAVENOUS | Status: DC | PRN
  Administered 2024-05-24: 4 ug via INTRAVENOUS

## 2024-05-24 MED ORDER — DEXAMETHASONE SOD PHOSPHATE PF 10 MG/ML IJ SOLN
INTRAMUSCULAR | Status: DC | PRN
  Administered 2024-05-24: 10 mg via INTRAVENOUS

## 2024-05-24 MED ORDER — OXYCODONE HCL 5 MG PO TABS: 5.0000 mg | ORAL_TABLET | ORAL | 0 refills | Status: DC | PRN

## 2024-05-24 MED ORDER — 0.9 % SODIUM CHLORIDE (POUR BTL) OPTIME
TOPICAL | Status: DC | PRN
  Administered 2024-05-24: 1000 mL

## 2024-05-24 MED ORDER — ACETAMINOPHEN 500 MG PO TABS
1000.0000 mg | ORAL_TABLET | ORAL | Status: AC
  Administered 2024-05-24: 1000 mg via ORAL
  Filled 2024-05-24: qty 2

## 2024-05-24 MED ORDER — LIDOCAINE HCL (PF) 2 % IJ SOLN
INTRAMUSCULAR | Status: AC
  Filled 2024-05-24: qty 15

## 2024-05-24 MED ORDER — SUGAMMADEX SODIUM 200 MG/2ML IV SOLN
INTRAVENOUS | Status: AC
  Filled 2024-05-24: qty 2

## 2024-05-24 MED ORDER — SUGAMMADEX SODIUM 200 MG/2ML IV SOLN
INTRAVENOUS | Status: DC | PRN
  Administered 2024-05-24: 200 mg via INTRAVENOUS

## 2024-05-24 MED ORDER — ARTIFICIAL TEARS OPHTHALMIC OINT
TOPICAL_OINTMENT | OPHTHALMIC | Status: AC
  Filled 2024-05-24: qty 3.5

## 2024-05-24 MED ORDER — GABAPENTIN 300 MG PO CAPS
300.0000 mg | ORAL_CAPSULE | ORAL | Status: AC
  Administered 2024-05-24: 300 mg via ORAL
  Filled 2024-05-24: qty 1

## 2024-05-24 MED ORDER — MIDAZOLAM HCL 2 MG/2ML IJ SOLN
INTRAMUSCULAR | Status: AC
  Filled 2024-05-24: qty 2

## 2024-05-24 MED ORDER — ORAL CARE MOUTH RINSE: 15.0000 mL | Freq: Once | OROMUCOSAL | Status: AC

## 2024-05-24 MED ORDER — GLYCOPYRROLATE 0.2 MG/ML IJ SOLN
INTRAMUSCULAR | Status: AC
  Filled 2024-05-24: qty 1

## 2024-05-24 MED ORDER — LIDOCAINE HCL (PF) 2 % IJ SOLN
INTRAMUSCULAR | Status: DC | PRN
  Administered 2024-05-24: 100 mg via INTRADERMAL

## 2024-05-24 MED ORDER — ROCURONIUM BROMIDE 10 MG/ML (PF) SYRINGE
PREFILLED_SYRINGE | INTRAVENOUS | Status: DC | PRN
  Administered 2024-05-24: 70 mg via INTRAVENOUS

## 2024-05-24 MED ORDER — ROCURONIUM BROMIDE 10 MG/ML (PF) SYRINGE
PREFILLED_SYRINGE | INTRAVENOUS | Status: AC
  Filled 2024-05-24: qty 10

## 2024-05-24 MED ORDER — ONDANSETRON HCL 4 MG/2ML IJ SOLN
INTRAMUSCULAR | Status: DC | PRN
  Administered 2024-05-24: 4 mg via INTRAVENOUS

## 2024-05-24 SURGICAL SUPPLY — 27 items
BAG COUNTER SPONGE SURGICOUNT (BAG) ×1 IMPLANT
BLADE CLIPPER SURG (BLADE) IMPLANT
CANISTER SUCTION 3000ML PPV (SUCTIONS) IMPLANT
CHLORAPREP W/TINT 26 (MISCELLANEOUS) ×2 IMPLANT
COTTONBALL LRG STERILE PKG (GAUZE/BANDAGES/DRESSINGS) ×1 IMPLANT
COVER SURGICAL LIGHT HANDLE (MISCELLANEOUS) ×2 IMPLANT
DERMABOND ADVANCED .7 DNX12 (GAUZE/BANDAGES/DRESSINGS) ×2 IMPLANT
DRAPE LAPAROTOMY 100X72 PEDS (DRAPES) ×2 IMPLANT
ELECTRODE REM PT RTRN 9FT ADLT (ELECTROSURGICAL) ×1 IMPLANT
GAUZE 4X4 16PLY ~~LOC~~+RFID DBL (SPONGE) IMPLANT
GLOVE BIOGEL PI MICRO STRL 6 (GLOVE) ×2 IMPLANT
GLOVE INDICATOR 6.5 STRL GRN (GLOVE) ×2 IMPLANT
GOWN STRL REUS W/ TWL LRG LVL3 (GOWN DISPOSABLE) ×4 IMPLANT
KIT BASIN OR (CUSTOM PROCEDURE TRAY) ×2 IMPLANT
KIT TURNOVER KIT B (KITS) ×2 IMPLANT
NDL HYPO 22X1.5 SAFETY MO (MISCELLANEOUS) IMPLANT
NDL HYPO 25GX1X1/2 BEV (NEEDLE) ×1 IMPLANT
PACK GENERAL/GYN (CUSTOM PROCEDURE TRAY) ×1 IMPLANT
PAD ARMBOARD POSITIONER FOAM (MISCELLANEOUS) ×1 IMPLANT
SOLN 0.9% NACL POUR BTL 1000ML (IV SOLUTION) ×1 IMPLANT
SUT MNCRL AB 4-0 PS2 18 (SUTURE) ×1 IMPLANT
SUT PDS PLUS AB 0 CT-2 (SUTURE) ×2 IMPLANT
SUT VIC AB 3-0 SH 18 (SUTURE) IMPLANT
SUT VIC AB 3-0 SH 27X BRD (SUTURE) ×1 IMPLANT
SYR CONTROL 10ML LL (SYRINGE) ×2 IMPLANT
TOWEL GREEN STERILE (TOWEL DISPOSABLE) ×1 IMPLANT
TOWEL GREEN STERILE FF (TOWEL DISPOSABLE) ×1 IMPLANT

## 2024-05-24 NOTE — Transfer of Care (Signed)
 Immediate Anesthesia Transfer of Care Note  Patient: Renee Herrera  Procedure(s) Performed: REPAIR, HERNIA, VENTRAL (Abdomen)  Patient Location: PACU  Anesthesia Type:General  Level of Consciousness: awake, alert , oriented, and patient cooperative  Airway & Oxygen Therapy: Patient Spontanous Breathing and Patient connected to face mask oxygen  Post-op Assessment: Report given to RN and Post -op Vital signs reviewed and stable  Post vital signs: Reviewed and stable  Last Vitals:  Vitals Value Taken Time  BP 122/77 05/24/24 08:30  Temp    Pulse 73 05/24/24 08:31  Resp 21 05/24/24 08:31  SpO2 100 % 05/24/24 08:31  Vitals shown include unfiled device data.  Last Pain:  Vitals:   05/24/24 0557  PainSc: 10-Worst pain ever         Complications: No notable events documented.
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
## 2024-05-24 NOTE — Anesthesia Procedure Notes (Signed)
 Procedure Name: Intubation Date/Time: 05/24/2024 7:25 AM  Performed by: Franchot Delon RAMAN, CRNAPre-anesthesia Checklist: Patient identified, Emergency Drugs available, Suction available and Patient being monitored Patient Re-evaluated:Patient Re-evaluated prior to induction Oxygen Delivery Method: Circle System Utilized Preoxygenation: Pre-oxygenation with 100% oxygen Induction Type: IV induction Ventilation: Mask ventilation without difficulty Laryngoscope Size: Mac and 3 Grade View: Grade I Tube type: Oral Tube size: 7.0 mm Number of attempts: 1 Airway Equipment and Method: Stylet Placement Confirmation: ETT inserted through vocal cords under direct vision, positive ETCO2 and breath sounds checked- equal and bilateral Secured at: 22 cm Tube secured with: Tape Dental Injury: Teeth and Oropharynx as per pre-operative assessment

## 2024-05-24 NOTE — Anesthesia Postprocedure Evaluation (Signed)
 Anesthesia Post Note  Patient: Renee Herrera  Procedure(s) Performed: REPAIR, HERNIA, VENTRAL (Abdomen)     Patient location during evaluation: PACU Anesthesia Type: General Level of consciousness: awake and alert Pain management: pain level controlled Vital Signs Assessment: post-procedure vital signs reviewed and stable Respiratory status: spontaneous breathing, nonlabored ventilation, respiratory function stable and patient connected to nasal cannula oxygen Cardiovascular status: blood pressure returned to baseline and stable Postop Assessment: no apparent nausea or vomiting Anesthetic complications: no   No notable events documented.  Last Vitals:  Vitals:   05/24/24 0900 05/24/24 0930  BP: (!) 152/96 (!) 142/83  Pulse: (!) 57 (!) 54  Resp: 16 14  Temp: 36.5 C 36.5 C  SpO2: 100% 100%    Last Pain:  Vitals:   05/24/24 0930  PainSc: 0-No pain                 Lynwood MARLA Cornea

## 2024-05-24 NOTE — Op Note (Signed)
 Date of surgery : 05/24/24  Procedure open primary umbilical hernia repair.  Surgeon: Orie Silversmith, MD   Preoperative diagnosis supraumbilical hernia  Postoperative diagnosis supraumbilical hernia  Operative findings Small sized supraumbilical hernia measuring 1.5 x 0.75cm  Anesthesia Gen. endotracheal anesthesia.   Estimated blood loss : minimal  Indication for procedure 48 yo F with long term supraumbilical hernia causing discomfort.  Procedure in detail After obtaining consent from the patient patient was taken to the operating room and laid supine on the operating table.  Subsequently underwent general endotracheal anesthesia.  A timeout was then called.  0.25% marcaine  with epi was injected into the skin overlying the hernia and planned incision. A 5 centimeters supraumbilical vertical midline incision was then made going through skin and subcutaneous tissue  All the way  down to the hernia sac.  The sac was then separated from the adjoining soft tissue. The fascia defect was noted and the hernia sac contents were very large in comparison to size of defect, and unable to reduce. Hernia sac was then opened and hernia containing preperitoneal fat and minimal omentum. The contents were carefully explored to ensure no bowel or other structures contained besides fat and omentum. The contents were then transected with hernia sac and sent off for pathology. Hemostasis confirmed. The proximal remaining fat easily reduced back into peritoneal cavity. The hernia sac was then closed using a 3-0 vicryl running suture.  We then cleared the fascia anteriorly of subcutaneous fat cleared the posterior fascia from peritoneum. The defect was measured to be 1.5x0.75cm and decision made to primarily repair given small size and would have to enlarge fascial defect to use mesh. Sutures of #0 Prolene were then placed to close the fascia in figure of eight fashion. The wound was then copiously irrigated with normal  saline solution. Remaining local used at fascia and subcutaneous tissues. 3-0 vicryl sutures used to close subcutaneous tissues and then deep dermals. Skin closed with 4-0 monocryl and dermabond. Patient was then reversed from general anesthesia and taken to the recovery area.   Orie Silversmith, MD General Surgery, Surgical Critical Care and Trauma

## 2024-05-24 NOTE — Anesthesia Preprocedure Evaluation (Signed)
 Anesthesia Evaluation  Patient identified by MRN, date of birth, ID band Patient awake    Reviewed: Allergy & Precautions, NPO status , Patient's Chart, lab work & pertinent test results, reviewed documented beta blocker date and time   History of Anesthesia Complications Negative for: history of anesthetic complications  Airway Mallampati: I  TM Distance: >3 FB     Dental no notable dental hx.    Pulmonary neg COPD, Current Smoker and Patient abstained from smoking.   breath sounds clear to auscultation       Cardiovascular (-) angina (-) CAD and (-) Past MI (-) dysrhythmias (-) pacemaker Rhythm:Regular Rate:Normal     Neuro/Psych  Headaches PSYCHIATRIC DISORDERS Anxiety Depression       GI/Hepatic ,,,(+) neg Cirrhosis        Endo/Other    Renal/GU Renal disease     Musculoskeletal   Abdominal   Peds  Hematology   Anesthesia Other Findings   Reproductive/Obstetrics                              Anesthesia Physical Anesthesia Plan  ASA: 2  Anesthesia Plan: General   Post-op Pain Management:    Induction: Intravenous  PONV Risk Score and Plan: 1 and Ondansetron  and Dexamethasone   Airway Management Planned: Oral ETT  Additional Equipment:   Intra-op Plan:   Post-operative Plan: Extubation in OR  Informed Consent: I have reviewed the patients History and Physical, chart, labs and discussed the procedure including the risks, benefits and alternatives for the proposed anesthesia with the patient or authorized representative who has indicated his/her understanding and acceptance.     Dental advisory given  Plan Discussed with: CRNA  Anesthesia Plan Comments:          Anesthesia Quick Evaluation

## 2024-05-25 ENCOUNTER — Encounter (HOSPITAL_COMMUNITY): Payer: Self-pay | Admitting: General Surgery

## 2024-05-25 LAB — SURGICAL PATHOLOGY

## 2024-06-07 ENCOUNTER — Telehealth: Payer: Self-pay

## 2024-06-07 ENCOUNTER — Encounter (INDEPENDENT_AMBULATORY_CARE_PROVIDER_SITE_OTHER): Payer: Self-pay | Admitting: Primary Care

## 2024-06-07 ENCOUNTER — Ambulatory Visit (INDEPENDENT_AMBULATORY_CARE_PROVIDER_SITE_OTHER): Payer: Self-pay | Admitting: Primary Care

## 2024-06-07 VITALS — BP 134/88 | HR 75 | Resp 16 | Wt 169.6 lb

## 2024-06-07 DIAGNOSIS — Z1231 Encounter for screening mammogram for malignant neoplasm of breast: Secondary | ICD-10-CM

## 2024-06-07 DIAGNOSIS — Z1211 Encounter for screening for malignant neoplasm of colon: Secondary | ICD-10-CM | POA: Diagnosis not present

## 2024-06-07 NOTE — Telephone Encounter (Signed)
 Patient called stating she recently had hernia surgery and is scheduled for surgery on 12/19 with Dr Harden.  She stated she needs a note that says she is not going to be able to work. She is requesting a note also that says she has an appointment on 12/19 with Dr Harden. Please call patient back to advise [phone]

## 2024-06-07 NOTE — Progress Notes (Signed)
 [facility]  Renee Herrera, is a 48 y.o. female  RDW:[id number]  FMW:[id number]  DOB - 04-24-1976  Chief Complaint  Patient presents with   Cyst    Pt had surgery 05/24/24... Pt states she is needing a Dr. note        Subjective:   Renee Herrera is a 48 y.o. female here today for an acute visit.  Patient had hernia repair by Dr. Richerd Silversmith.  Called office to question when she is supposed to go back to work. Nurse stated she has an appointment on June 14, 2024 at that time the doctor will determine when she will be able to return to work.  Office also faxed over a doctor's note statement in her being out of work until that time for evaluation  No problems updated.  Comprehensive ROS Pertinent positive and negative noted in HPI   Allergies[1]  Past Medical History:  Diagnosis Date   Anxiety    Depression    Fibroid    Hernia, umbilical    Migraine headache     Medications Ordered Prior to Encounter[2] Health Maintenance  Topic Date Due   COVID-19 Vaccine (1) Never done   DTaP/Tdap/Td vaccine (1 - Tdap) Never done   Hepatitis B Vaccine (1 of 3 - 19+ 3-dose series) Never done   Pap with HPV screening  Never done   Breast Cancer Screening  Never done   Colon Cancer Screening  Never done   Flu Shot  09/21/2024*   Pneumococcal Vaccine (1 of 2 - PCV) 01/27/2025*   Hepatitis C Screening  Completed   HIV Screening  Completed   HPV Vaccine  Aged Out   Meningitis B Vaccine  Aged Out  *Topic was postponed. The date shown is not the original due date.    Objective:   Vitals:   06/07/24 0848  BP: 134/88  Pulse: 75  Resp: 16  SpO2: 100%  Weight: 169 lb 9.6 oz (76.9 kg)   BP Readings from Last 3 Encounters:  06/07/24 134/88  05/24/24 (!) 142/83  04/29/24 120/81      Physical Exam Vitals reviewed.  Constitutional:      Appearance: Normal appearance. She is obese.  HENT:     Head: Normocephalic.     Right Ear: Tympanic membrane, ear canal  and external ear normal.     Left Ear: Tympanic membrane, ear canal and external ear normal.     Nose: Nose normal.     Mouth/Throat:     Mouth: Mucous membranes are moist.  Eyes:     Extraocular Movements: Extraocular movements intact.     Pupils: Pupils are equal, round, and reactive to light.  Cardiovascular:     Rate and Rhythm: Normal rate.  Pulmonary:     Effort: Pulmonary effort is normal.     Breath sounds: Normal breath sounds.  Abdominal:     General: Bowel sounds are normal.     Palpations: Abdomen is soft.  Musculoskeletal:        General: Normal range of motion.     Cervical back: Normal range of motion.  Skin:    General: Skin is warm and dry.     Comments: Surgical scar healing no signs and symptoms of infection she still is tender abdomen area  Neurological:     Mental Status: She is alert and oriented to person, place, and time.  Psychiatric:        Mood and Affect: Mood normal.  Behavior: Behavior normal.        Thought Content: Thought content normal.    Assessment & Plan  Renee Herrera was seen today for cyst.  Diagnoses and all orders for this visit:  Encounter for screening mammogram for malignant neoplasm of breast -     MM 3D SCREENING MAMMOGRAM BILATERAL BREAST; Future  Colon cancer screening -     Ambulatory referral to Gastroenterology     Patient have been counseled extensively about nutrition and exercise. Other issues discussed during this visit include: low cholesterol diet, weight control and daily exercise, foot care, annual eye examinations at Ophthalmology, importance of adherence with medications and regular follow-up. We also discussed long term complications of uncontrolled diabetes and hypertension.   Return in about 2 months (around 08/08/2024) for pap.  The patient was given clear instructions to go to ER or return to medical center if symptoms don't improve, worsen or new problems develop. The patient verbalized understanding. The  patient was told to call to get lab results if they haven't heard anything in the next week.   This note has been created with Education officer, environmental. Any transcriptional errors are unintentional.   Renee SHAUNNA Bohr, NP 06/07/2024, 9:03 AM    [1] No Known Allergies [2]  Current Outpatient Medications on File Prior to Visit  Medication Sig Dispense Refill   acetaminophen  (TYLENOL ) 325 MG tablet Take 2 tablets (650 mg total) by mouth every 6 (six) hours as needed. 36 tablet 0   ibuprofen  (ADVIL ) 600 MG tablet Take 1 tablet (600 mg total) by mouth every 6 (six) hours as needed. 30 tablet 0   oxyCODONE  (ROXICODONE ) 5 MG immediate release tablet Take 1 tablet (5 mg total) by mouth every 4 (four) hours as needed for severe pain (pain score 7-10). 20 tablet 0   silver  sulfADIAZINE  (SILVADENE ) 1 % cream Apply 1 Application topically daily. 50 g 0   urea  (CARMOL) 10 % cream Apply topically as needed. (Patient not taking: No sig reported) 71 g 0   No current facility-administered medications on file prior to visit.

## 2024-06-10 ENCOUNTER — Encounter (HOSPITAL_COMMUNITY): Payer: Self-pay | Admitting: Orthopedic Surgery

## 2024-06-10 ENCOUNTER — Other Ambulatory Visit: Payer: Self-pay

## 2024-06-10 NOTE — Anesthesia Preprocedure Evaluation (Addendum)
 Anesthesia Evaluation  Patient identified by MRN, date of birth, ID band Patient awake    Reviewed: Allergy & Precautions, NPO status , Patient's Chart, lab work & pertinent test results, reviewed documented beta blocker date and time   History of Anesthesia Complications Negative for: history of anesthetic complications  Airway Mallampati: I  TM Distance: >3 FB     Dental no notable dental hx.    Pulmonary neg COPD, Current Smoker and Patient abstained from smoking.   breath sounds clear to auscultation       Cardiovascular (-) angina (-) CAD and (-) Past MI (-) dysrhythmias (-) pacemaker Rhythm:Regular Rate:Normal     Neuro/Psych  Headaches PSYCHIATRIC DISORDERS Anxiety Depression       GI/Hepatic ,,,(+) neg Cirrhosis        Endo/Other    Renal/GU Renal disease     Musculoskeletal   Abdominal   Peds  Hematology   Anesthesia Other Findings   Reproductive/Obstetrics                              Anesthesia Physical Anesthesia Plan  ASA: 2  Anesthesia Plan: Regional and General   Post-op Pain Management: Tylenol  PO (pre-op)* and Celebrex  PO (pre-op)*   Induction: Intravenous  PONV Risk Score and Plan: 1 and Ondansetron  and Dexamethasone   Airway Management Planned: LMA  Additional Equipment: None  Intra-op Plan:   Post-operative Plan: Extubation in OR  Informed Consent: I have reviewed the patients History and Physical, chart, labs and discussed the procedure including the risks, benefits and alternatives for the proposed anesthesia with the patient or authorized representative who has indicated his/her understanding and acceptance.     Dental advisory given  Plan Discussed with: CRNA and Anesthesiologist  Anesthesia Plan Comments:          Anesthesia Quick Evaluation
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
## 2024-06-10 NOTE — Progress Notes (Signed)
 SDW call  Patient was given pre-op instructions over the phone. Patient verbalized understanding of instructions provided. Denies any SOB, fever or cough. States she has transportation lined up to drive her home. Explained hospital policy, said she does not have a way home. Olivia Server, RN charge nurse made aware.     PCP - Rosaline Bohr, NP Cardiologist -  Pulmonary:    PPM/ICD - denies Device Orders - na Rep Notified - na   Chest x-ray -  EKG -   Stress Test - ECHO -  Cardiac Cath -   Sleep Study/sleep apnea/CPAP: denies  Non-diabetic  Blood Thinner Instructions: denies  Aspirin Instructions:denies   ERAS Protcol - Clears until 0430   Anesthesia review: No  Your procedure is scheduled on Friday June 11, 2024  Report to [facility] Main Entrance A at  0530  A.M., then check in with the Admitting office.  Call this number if you have problems the morning of surgery:  [phone]   If you have any questions prior to your surgery date call [phone]: Open Monday-Friday 8am-4pm If you experience any cold or flu symptoms such as cough, fever, chills, shortness of breath, etc. between now and your scheduled surgery, please notify us  at the above number     Remember:  Do not eat after midnight the night before your surgery  You may drink clear liquids until  0430   the morning of your surgery.   Clear liquids allowed are: Water, Non-Citrus Juices (without pulp), Carbonated Beverages, Clear Tea, Black Coffee ONLY (NO MILK, CREAM OR POWDERED CREAMER of any kind), and Gatorade   Take these medicines if needed the morning of surgery with A SIP OF WATER:  Oxycodone   As of today, STOP taking any Aspirin (unless otherwise instructed by your surgeon) Aleve , Naproxen , Ibuprofen , Motrin , Advil , Goody's, BC's, all herbal medications, fish oil, and all vitamins.

## 2024-06-10 NOTE — Telephone Encounter (Signed)
 Letter in pt's chart. Pt informed.

## 2024-06-11 ENCOUNTER — Encounter: Admission: RE | Disposition: A | Payer: Self-pay | Attending: Orthopedic Surgery

## 2024-06-11 ENCOUNTER — Encounter (HOSPITAL_COMMUNITY): Payer: Self-pay | Admitting: Anesthesiology

## 2024-06-11 ENCOUNTER — Encounter (HOSPITAL_COMMUNITY): Payer: Self-pay | Admitting: Orthopedic Surgery

## 2024-06-11 ENCOUNTER — Ambulatory Visit (HOSPITAL_COMMUNITY)
Admission: RE | Admit: 2024-06-11 | Discharge: 2024-06-11 | Disposition: A | Discharge status: Home / Self Care | Attending: Orthopedic Surgery | Admitting: Orthopedic Surgery

## 2024-06-11 ENCOUNTER — Ambulatory Visit (HOSPITAL_COMMUNITY): Payer: Self-pay | Admitting: Anesthesiology

## 2024-06-11 ENCOUNTER — Other Ambulatory Visit (HOSPITAL_COMMUNITY): Payer: Self-pay

## 2024-06-11 DIAGNOSIS — M21611 Bunion of right foot: Secondary | ICD-10-CM | POA: Insufficient documentation

## 2024-06-11 DIAGNOSIS — F418 Other specified anxiety disorders: Secondary | ICD-10-CM

## 2024-06-11 HISTORY — PX: BUNIONECTOMY: SHX129

## 2024-06-11 LAB — COMPREHENSIVE METABOLIC PANEL WITH GFR
ALT: 15 U/L (ref 0–44)
AST: 18 U/L (ref 15–41)
Albumin: 4.2 g/dL (ref 3.5–5.0)
Alkaline Phosphatase: 72 U/L (ref 38–126)
Anion gap: 10 (ref 5–15)
BUN: 14 mg/dL (ref 6–20)
CO2: 23 mmol/L (ref 22–32)
Calcium: 9.5 mg/dL (ref 8.9–10.3)
Chloride: 105 mmol/L (ref 98–111)
Creatinine, Ser: 0.69 mg/dL (ref 0.44–1.00)
GFR, Estimated: >60 mL/min
Glucose, Bld: 99 mg/dL (ref 70–99)
Potassium: 3.9 mmol/L (ref 3.5–5.1)
Sodium: 138 mmol/L (ref 135–145)
Total Bilirubin: 0.6 mg/dL (ref 0.0–1.2)
Total Protein: 7 g/dL (ref 6.5–8.1)

## 2024-06-11 LAB — CBC WITH DIFFERENTIAL/PLATELET
Abs Immature Granulocytes: 0.02 K/uL (ref 0.00–0.07)
Basophils Absolute: 0.1 K/uL (ref 0.0–0.1)
Basophils Relative: 1 %
Eosinophils Absolute: 0.1 K/uL (ref 0.0–0.5)
Eosinophils Relative: 1 %
HCT: 41.1 % (ref 36.0–46.0)
Hemoglobin: 14.9 g/dL (ref 12.0–15.0)
Immature Granulocytes: 0 %
Lymphocytes Relative: 29 %
Lymphs Abs: 2.6 K/uL (ref 0.7–4.0)
MCH: 29.4 pg (ref 26.0–34.0)
MCHC: 36.3 g/dL — ABNORMAL HIGH (ref 30.0–36.0)
MCV: 81.2 fL (ref 80.0–100.0)
Monocytes Absolute: 0.6 K/uL (ref 0.1–1.0)
Monocytes Relative: 6 %
Neutro Abs: 5.6 K/uL (ref 1.7–7.7)
Neutrophils Relative %: 63 %
Platelets: 355 K/uL (ref 150–400)
RBC: 5.06 MIL/uL (ref 3.87–5.11)
RDW: 13.7 % (ref 11.5–15.5)
WBC: 8.9 K/uL (ref 4.0–10.5)
nRBC: 0 % (ref 0.0–0.2)

## 2024-06-11 LAB — POCT PREGNANCY, URINE: Preg Test, Ur: NEGATIVE

## 2024-06-11 SURGERY — BUNIONECTOMY
Anesthesia: Regional | Site: Toe | Laterality: Right

## 2024-06-11 MED ORDER — ONDANSETRON HCL 4 MG/2ML IJ SOLN
INTRAMUSCULAR | Status: DC | PRN
  Administered 2024-06-11: 4 mg via INTRAVENOUS

## 2024-06-11 MED ORDER — CHLORHEXIDINE GLUCONATE 0.12 % MT SOLN
15.0000 mL | Freq: Once | OROMUCOSAL | Status: AC
  Administered 2024-06-11: 15 mL via OROMUCOSAL
  Filled 2024-06-11: qty 15

## 2024-06-11 MED ORDER — MIDAZOLAM HCL 2 MG/2ML IJ SOLN
INTRAMUSCULAR | Status: AC
  Filled 2024-06-11: qty 2

## 2024-06-11 MED ORDER — ONDANSETRON HCL 4 MG/2ML IJ SOLN: 4.0000 mg | Freq: Once | INTRAMUSCULAR | Status: DC | PRN

## 2024-06-11 MED ORDER — PROPOFOL 10 MG/ML IV BOLUS
INTRAVENOUS | Status: DC | PRN
  Administered 2024-06-11: 150 mg via INTRAVENOUS

## 2024-06-11 MED ORDER — PROPOFOL 10 MG/ML IV BOLUS
INTRAVENOUS | Status: AC
  Filled 2024-06-11: qty 20

## 2024-06-11 MED ORDER — ORAL CARE MOUTH RINSE: 15.0000 mL | Freq: Once | OROMUCOSAL | Status: AC

## 2024-06-11 MED ORDER — FENTANYL CITRATE (PF) 100 MCG/2ML IJ SOLN
INTRAMUSCULAR | Status: DC | PRN
  Administered 2024-06-11 (×2): 50 ug via INTRAVENOUS

## 2024-06-11 MED ORDER — BUPIVACAINE LIPOSOME 1.3 % IJ SUSP
INTRAMUSCULAR | Status: DC | PRN
  Administered 2024-06-11: 10 mL

## 2024-06-11 MED ORDER — LIDOCAINE 2% (20 MG/ML) 5 ML SYRINGE
INTRAMUSCULAR | Status: DC | PRN
  Administered 2024-06-11: 100 mg via INTRAVENOUS

## 2024-06-11 MED ORDER — PHENYLEPHRINE 80 MCG/ML (10ML) SYRINGE FOR IV PUSH (FOR BLOOD PRESSURE SUPPORT)
PREFILLED_SYRINGE | INTRAVENOUS | Status: DC | PRN
  Administered 2024-06-11: 80 ug via INTRAVENOUS

## 2024-06-11 MED ORDER — EPHEDRINE SULFATE-NACL 50-0.9 MG/10ML-% IV SOSY
PREFILLED_SYRINGE | INTRAVENOUS | Status: DC | PRN
  Administered 2024-06-11 (×2): 5 mg via INTRAVENOUS

## 2024-06-11 MED ORDER — FENTANYL CITRATE (PF) 100 MCG/2ML IJ SOLN
INTRAMUSCULAR | Status: AC
  Filled 2024-06-11: qty 2

## 2024-06-11 MED ORDER — OXYCODONE-ACETAMINOPHEN 5-325 MG PO TABS: 1.0000 | ORAL_TABLET | ORAL | 0 refills | Status: AC | PRN

## 2024-06-11 MED ORDER — BUPIVACAINE HCL 0.5 % IJ SOLN
INTRAMUSCULAR | Status: DC | PRN
  Administered 2024-06-11: 10 mL

## 2024-06-11 MED ORDER — LACTATED RINGERS IV SOLN: INTRAVENOUS | Status: DC

## 2024-06-11 MED ORDER — CEFAZOLIN SODIUM-DEXTROSE 2-4 GM/100ML-% IV SOLN
2.0000 g | INTRAVENOUS | Status: AC
  Administered 2024-06-11: 2 g via INTRAVENOUS
  Filled 2024-06-11: qty 100

## 2024-06-11 MED ORDER — OXYCODONE-ACETAMINOPHEN 5-325 MG PO TABS
1.0000 | ORAL_TABLET | ORAL | 0 refills | Status: DC | PRN
  Filled 2024-06-11: qty 30, 5d supply, fill #0

## 2024-06-11 MED ORDER — MEPERIDINE HCL 25 MG/ML IJ SOLN: 6.2500 mg | INTRAMUSCULAR | Status: DC | PRN

## 2024-06-11 MED ORDER — BUPIVACAINE-EPINEPHRINE (PF) 0.5% -1:200000 IJ SOLN
INTRAMUSCULAR | Status: DC | PRN
  Administered 2024-06-11: 15 mL via PERINEURAL

## 2024-06-11 MED ORDER — OXYCODONE HCL 5 MG/5ML PO SOLN: 5.0000 mg | Freq: Once | ORAL | Status: DC | PRN

## 2024-06-11 MED ORDER — OXYCODONE HCL 5 MG PO TABS: 5.0000 mg | ORAL_TABLET | Freq: Once | ORAL | Status: DC | PRN

## 2024-06-11 MED ORDER — MIDAZOLAM HCL (PF) 2 MG/2ML IJ SOLN
INTRAMUSCULAR | Status: DC | PRN
  Administered 2024-06-11 (×2): 1 mg via INTRAVENOUS

## 2024-06-11 MED ORDER — DEXMEDETOMIDINE HCL IN NACL 80 MCG/20ML IV SOLN
INTRAVENOUS | Status: DC | PRN
  Administered 2024-06-11: 8 ug via INTRAVENOUS
  Administered 2024-06-11: 4 ug via INTRAVENOUS

## 2024-06-11 MED ORDER — VASHE WOUND IRRIGATION OPTIME
TOPICAL | Status: DC | PRN
  Administered 2024-06-11: 34 [oz_av]

## 2024-06-11 MED ORDER — 0.9 % SODIUM CHLORIDE (POUR BTL) OPTIME
TOPICAL | Status: DC | PRN
  Administered 2024-06-11: 1000 mL

## 2024-06-11 MED ORDER — FENTANYL CITRATE (PF) 100 MCG/2ML IJ SOLN
25.0000 ug | INTRAMUSCULAR | Status: DC | PRN
  Administered 2024-06-11: 25 ug via INTRAVENOUS

## 2024-06-11 MED ORDER — DEXAMETHASONE SOD PHOSPHATE PF 10 MG/ML IJ SOLN
INTRAMUSCULAR | Status: DC | PRN
  Administered 2024-06-11: 10 mg via INTRAVENOUS

## 2024-06-11 SURGICAL SUPPLY — 44 items
BAG COUNTER SPONGE SURGICOUNT (BAG) ×1 IMPLANT
BLADE LONG MED 31X9 (MISCELLANEOUS) IMPLANT
BLADE MINI RND TIP GREEN BEAV (BLADE) IMPLANT
BNDG COHESIVE 1X5 TAN STRL LF (GAUZE/BANDAGES/DRESSINGS) IMPLANT
BNDG COHESIVE 6X5 TAN NS LF (GAUZE/BANDAGES/DRESSINGS) IMPLANT
BNDG COHESIVE 6X5 TAN ST LF (GAUZE/BANDAGES/DRESSINGS) IMPLANT
BNDG COMPR ESMARK 4X3 LF (GAUZE/BANDAGES/DRESSINGS) ×1 IMPLANT
BNDG GAUZE DERMACEA FLUFF 4 (GAUZE/BANDAGES/DRESSINGS) IMPLANT
CORD BIPOLAR FORCEPS 12FT (ELECTRODE) ×1 IMPLANT
COVER SURGICAL LIGHT HANDLE (MISCELLANEOUS) ×2 IMPLANT
CUFF TOURN SGL QUICK 18X4 (TOURNIQUET CUFF) IMPLANT
CUFF TOURN SGL QUICK 42 (TOURNIQUET CUFF) IMPLANT
CUFF TRNQT CYL 24X4X16.5-23 (TOURNIQUET CUFF) IMPLANT
CUFF TRNQT CYL 34X4.125X (TOURNIQUET CUFF) IMPLANT
DRAPE OEC MINIVIEW 54X84 (DRAPES) IMPLANT
DRAPE U-SHAPE 47X51 STRL (DRAPES) ×1 IMPLANT
DRESSING RESTOR ADAPTIFORM 49 (GAUZE/BANDAGES/DRESSINGS) IMPLANT
DRSG ADAPTIC 3X8 NADH LF (GAUZE/BANDAGES/DRESSINGS) IMPLANT
DURAPREP 26ML APPLICATOR (WOUND CARE) ×1 IMPLANT
ELECTRODE REM PT RTRN 9FT ADLT (ELECTROSURGICAL) ×1 IMPLANT
GAUZE PAD ABD 8X10 STRL (GAUZE/BANDAGES/DRESSINGS) IMPLANT
GAUZE SPONGE 4X4 12PLY STRL (GAUZE/BANDAGES/DRESSINGS) IMPLANT
GLOVE BIOGEL PI IND STRL 9 (GLOVE) ×1 IMPLANT
GLOVE BIOGEL PI ORTHO SZ9 (GLOVE) ×1 IMPLANT
GOWN STRL REUS W/ TWL XL LVL3 (GOWN DISPOSABLE) ×2 IMPLANT
KIT BASIN OR (CUSTOM PROCEDURE TRAY) ×1 IMPLANT
KIT TURNOVER KIT B (KITS) ×1 IMPLANT
KWIRE DBL TROCAR .062X4 (WIRE) IMPLANT
MANIFOLD NEPTUNE II (INSTRUMENTS) ×1 IMPLANT
NDL HYPO 25GX1X1/2 BEV (NEEDLE) IMPLANT
NEEDLE HYPO 25GX1X1/2 BEV (NEEDLE) IMPLANT
PACK ORTHO EXTREMITY (CUSTOM PROCEDURE TRAY) ×1 IMPLANT
PAD ARMBOARD POSITIONER FOAM (MISCELLANEOUS) ×2 IMPLANT
PAD CAST 4YDX4 CTTN HI CHSV (CAST SUPPLIES) IMPLANT
PENCIL BUTTON HOLSTER BLD 10FT (ELECTRODE) IMPLANT
SOLN 0.9% NACL POUR BTL 1000ML (IV SOLUTION) ×1 IMPLANT
SOLN STERILE WATER BTL 1000 ML (IV SOLUTION) ×1 IMPLANT
SUCTION TUBE FRAZIER 10FR DISP (SUCTIONS) IMPLANT
SUT ETHILON 2 0 PSLX (SUTURE) IMPLANT
SUT VIC AB 2-0 FS1 27 (SUTURE) IMPLANT
SYR CONTROL 10ML LL (SYRINGE) IMPLANT
TOWEL GREEN STERILE (TOWEL DISPOSABLE) ×1 IMPLANT
TOWEL GREEN STERILE FF (TOWEL DISPOSABLE) ×1 IMPLANT
TUBE CONNECTING 12X1/4 (SUCTIONS) IMPLANT

## 2024-06-11 NOTE — Anesthesia Procedure Notes (Addendum)
 Anesthesia Regional Block: Adductor canal block   Pre-Anesthetic Checklist: , timeout performed,  Correct Patient, Correct Site, Correct Laterality,  Correct Procedure, Correct Position, site marked,  Risks and benefits discussed,  Surgical consent,  Pre-op evaluation,  At surgeon's request and post-op pain management  Laterality: Right  Prep: chloraprep       Needles:  Injection technique: Single-shot  Needle Type: Echogenic Stimulator Needle     Needle Length: 5cm  Needle Gauge: 22     Additional Needles:   Procedures:, nerve stimulator,,, ultrasound used (permanent image in chart),,    Narrative:  Start time: 06/11/2024 7:03 AM End time: 06/11/2024 7:10 AM Injection made incrementally with aspirations every 5 mL.  Performed by: Personally  Anesthesiologist: Mallory Manus, MD  Additional Notes: Functioning IV was confirmed and monitors were applied.  A 50mm 22ga Arrow echogenic stimulator needle was used. Sterile prep and drape,hand hygiene and sterile gloves were used. Ultrasound guidance: relevant anatomy identified, needle position confirmed, local anesthetic spread visualized around nerve(s)., vascular puncture avoided.  Image printed for medical record. Negative aspiration and negative test dose prior to incremental administration of local anesthetic. The patient tolerated the procedure well.

## 2024-06-11 NOTE — Anesthesia Postprocedure Evaluation (Signed)
"   Anesthesia Post Note  Patient: Renee Herrera  Procedure(s) Performed: BUNIONECTOMY (Right: Toe)     Patient location during evaluation: PACU Anesthesia Type: Regional and General Level of consciousness: awake and alert Pain management: pain level controlled Vital Signs Assessment: post-procedure vital signs reviewed and stable Respiratory status: spontaneous breathing, nonlabored ventilation, respiratory function stable and patient connected to nasal cannula oxygen Cardiovascular status: blood pressure returned to baseline and stable Postop Assessment: no apparent nausea or vomiting Anesthetic complications: no   No notable events documented.  Last Vitals:  Vitals:   06/11/24 0830 06/11/24 0845  BP: 95/70 106/77  Pulse: 67 69  Resp: 16 16  Temp:    SpO2: 98% 98%    Last Pain:  Vitals:   06/11/24 0823  TempSrc:   PainSc: 4                  Natia Fahmy      "

## 2024-06-11 NOTE — Interval H&P Note (Signed)
 History and Physical Interval Note:  06/11/2024 6:39 AM  Renee Herrera  has presented today for surgery, with the diagnosis of Bunion Right Great Toe.  The various methods of treatment have been discussed with the patient and family. After consideration of risks, benefits and other options for treatment, the patient has consented to  Procedures with comments: BUNIONECTOMY (Right) - RIGHT FOOT CHEVRON OSTEOTOMY as a surgical intervention.  The patient's history has been reviewed, patient examined, no change in status, stable for surgery.  I have reviewed the patient's chart and labs.  Questions were answered to the patient's satisfaction.     Elysabeth Aust V Trinetta Alemu

## 2024-06-11 NOTE — Transfer of Care (Signed)
 Immediate Anesthesia Transfer of Care Note  Patient: Renee Herrera  Procedure(s) Performed: BUNIONECTOMY (Right: Toe)  Patient Location: PACU  Anesthesia Type:General and Regional  Level of Consciousness: drowsy and patient cooperative  Airway & Oxygen Therapy: Patient Spontanous Breathing  Post-op Assessment: Report given to RN, Post -op Vital signs reviewed and stable, and Patient moving all extremities X 4  Post vital signs: Reviewed and stable  Last Vitals:  Vitals Value Taken Time  BP 87/60 06/11/24 08:10  Temp    Pulse 69 06/11/24 08:14  Resp 15 06/11/24 08:14  SpO2 96 % 06/11/24 08:14  Vitals shown include unfiled device data.  Last Pain:  Vitals:   06/11/24 0618  TempSrc:   PainSc: 5       Patients Stated Pain Goal: 3 (06/11/24 0618)  Complications: No notable events documented.

## 2024-06-11 NOTE — Discharge Instructions (Signed)
 NWB right foot with post op shoe for protection.  May heel weight bear for balance only if needed.  Elevate the right LE above your heart to decrease swelling and pain.  Keep dressing clean and dry.

## 2024-06-11 NOTE — Anesthesia Procedure Notes (Signed)
 Procedure Name: LMA Insertion Date/Time: 06/11/2024 7:26 AM  Performed by: Lamar Lucie DASEN, CRNAPre-anesthesia Checklist: Patient identified, Emergency Drugs available, Suction available and Patient being monitored Patient Re-evaluated:Patient Re-evaluated prior to induction Oxygen Delivery Method: Circle system utilized Preoxygenation: Pre-oxygenation with 100% oxygen Induction Type: IV induction Ventilation: Mask ventilation without difficulty LMA: LMA inserted LMA Size: 4.0 Number of attempts: 1 Placement Confirmation: positive ETCO2, breath sounds checked- equal and bilateral and CO2 detector Tube secured with: Tape Dental Injury: Teeth and Oropharynx as per pre-operative assessment

## 2024-06-11 NOTE — Op Note (Signed)
 06/11/2024  8:00 AM  PATIENT:  Renee Herrera    PRE-OPERATIVE DIAGNOSIS:  Bunion Right Great Toe  POST-OPERATIVE DIAGNOSIS:  Same  PROCEDURE:  BUNIONECTOMY  SURGEON:  Jerona LULLA Sage, MD  PHYSICIAN ASSISTANT:None ANESTHESIA:   General  PREOPERATIVE INDICATIONS:  Renee Herrera is a  48 y.o. female with a diagnosis of Bunion Right Great Toe who failed conservative measures and elected for surgical management.    The risks benefits and alternatives were discussed with the patient preoperatively including but not limited to the risks of infection, bleeding, nerve injury, cardiopulmonary complications, the need for revision surgery, among others, and the patient was willing to proceed.  OPERATIVE IMPLANTS:   * No implants in log *  @ENCIMAGES @  OPERATIVE FINDINGS: Reduction of the bunion deformity  OPERATIVE PROCEDURE: Patient is brought the operating room underwent a general anesthetic.  After adequate levels anesthesia were obtained patient's right lower extremity was prepped using DuraPrep draped into sterile field a timeout was called.  A medial longitudinal incision was made over the MTP joint.  This was carried down to the retinaculum which was incised a football shape was resected from the plantar aspect of the retinaculum.  The joint was exposed retractors placed and ostectomy was performed followed by a chevron osteotomy the metatarsal head was translated laterally 4 mm this was stabilized with a 0.0625 K wire and the second ostectomy was performed.  The wound was irrigated with Vashe.  The retinaculum was closed using 2-0 Vicryl skin was closed using 2-0 nylon sterile dressing was applied patient was extubated taken the PACU in stable condition   DISCHARGE PLANNING:  Antibiotic duration: Preoperative antibiotics  Weightbearing: Ideally nonweightbearing  Pain medication: Prescription for Percocet  Dressing care/ Wound VAC: Dry dressing  Ambulatory devices: Crutches  postoperative shoe  Discharge to: Home.  Follow-up: In the office 1 week post operative.

## 2024-06-11 NOTE — Anesthesia Procedure Notes (Signed)
 Anesthesia Regional Block: Popliteal block   Pre-Anesthetic Checklist: , timeout performed,  Correct Patient, Correct Site, Correct Laterality,  Correct Procedure, Correct Position, site marked,  Risks and benefits discussed,  Surgical consent,  Pre-op evaluation,  At surgeon's request and post-op pain management  Laterality: Right  Prep: chloraprep       Needles:  Injection technique: Single-shot  Needle Type: Echogenic Stimulator Needle     Needle Length: 5cm  Needle Gauge: 22     Additional Needles:   Procedures:, nerve stimulator,,, ultrasound used (permanent image in chart),,     Nerve Stimulator or Paresthesia:  Response: foot, 0.45 mA  Additional Responses:   Narrative:  Start time: 06/11/2024 7:00 AM End time: 06/11/2024 7:03 AM Injection made incrementally with aspirations every 5 mL.  Performed by: Personally  Anesthesiologist: Mallory Manus, MD  Additional Notes: Functioning IV was confirmed and monitors were applied.  A 50mm 22ga Arrow echogenic stimulator needle was used. Sterile prep and drape,hand hygiene and sterile gloves were used. Ultrasound guidance: relevant anatomy identified, needle position confirmed, local anesthetic spread visualized around nerve(s)., vascular puncture avoided.  Image printed for medical record. Negative aspiration and negative test dose prior to incremental administration of local anesthetic. The patient tolerated the procedure well.

## 2024-06-12 ENCOUNTER — Encounter (HOSPITAL_COMMUNITY): Payer: Self-pay | Admitting: Orthopedic Surgery

## 2024-06-20 ENCOUNTER — Encounter (HOSPITAL_COMMUNITY): Payer: Self-pay

## 2024-06-21 ENCOUNTER — Telehealth: Payer: Self-pay | Admitting: Orthopedic Surgery

## 2024-06-21 ENCOUNTER — Other Ambulatory Visit: Payer: Self-pay | Admitting: Orthopedic Surgery

## 2024-06-21 MED ORDER — OXYCODONE-ACETAMINOPHEN 5-325 MG PO TABS: 1.0000 | ORAL_TABLET | Freq: Three times a day (TID) | ORAL | 0 refills | Status: AC | PRN

## 2024-06-21 NOTE — Telephone Encounter (Signed)
 Oxycodone  last filled 06/11/24  S/p bilateral bunionectomy

## 2024-06-21 NOTE — Telephone Encounter (Signed)
 Rx refill Oxycodone    [facility] on [address]   Pt states she's completely out

## 2024-07-02 ENCOUNTER — Ambulatory Visit: Admitting: Family

## 2024-07-02 ENCOUNTER — Encounter: Payer: Self-pay | Admitting: Family

## 2024-07-02 ENCOUNTER — Other Ambulatory Visit: Payer: Self-pay

## 2024-07-02 ENCOUNTER — Encounter: Admitting: Physician Assistant

## 2024-07-02 DIAGNOSIS — M21611 Bunion of right foot: Secondary | ICD-10-CM
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
## 2024-07-16 ENCOUNTER — Ambulatory Visit: Admitting: Family

## 2024-07-16 ENCOUNTER — Other Ambulatory Visit: Payer: Self-pay

## 2024-07-16 DIAGNOSIS — M21611 Bunion of right foot: Secondary | ICD-10-CM | POA: Diagnosis not present
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
## 2024-07-21 ENCOUNTER — Encounter: Payer: Self-pay | Admitting: Family

## 2024-07-27 ENCOUNTER — Encounter: Payer: Self-pay | Admitting: Gastroenterology

## 2024-07-28 ENCOUNTER — Ambulatory Visit
Admission: RE | Admit: 2024-07-28 | Discharge: 2024-07-28 | Disposition: A | Source: Ambulatory Visit | Attending: Primary Care | Admitting: Primary Care

## 2024-07-28 DIAGNOSIS — Z1231 Encounter for screening mammogram for malignant neoplasm of breast: Secondary | ICD-10-CM
